# Patient Record
Sex: Male | Born: 1950 | Race: White | Hispanic: No | Smoking: Never smoker
Health system: Southern US, Community
[De-identification: ages and names within clinical notes are randomized; demographics above are authoritative.]

## PROBLEM LIST (undated history)

## (undated) DIAGNOSIS — R51 Headache: Secondary | ICD-10-CM

## (undated) DIAGNOSIS — G5600 Carpal tunnel syndrome, unspecified upper limb: Secondary | ICD-10-CM

## (undated) DIAGNOSIS — N289 Disorder of kidney and ureter, unspecified: Secondary | ICD-10-CM

## (undated) DIAGNOSIS — K219 Gastro-esophageal reflux disease without esophagitis: Secondary | ICD-10-CM

## (undated) DIAGNOSIS — M1990 Unspecified osteoarthritis, unspecified site: Secondary | ICD-10-CM

## (undated) DIAGNOSIS — N186 End stage renal disease: Secondary | ICD-10-CM

## (undated) DIAGNOSIS — I10 Essential (primary) hypertension: Secondary | ICD-10-CM

## (undated) DIAGNOSIS — T4145XA Adverse effect of unspecified anesthetic, initial encounter: Secondary | ICD-10-CM

## (undated) DIAGNOSIS — F40240 Claustrophobia: Secondary | ICD-10-CM

## (undated) DIAGNOSIS — E119 Type 2 diabetes mellitus without complications: Secondary | ICD-10-CM

## (undated) DIAGNOSIS — G459 Transient cerebral ischemic attack, unspecified: Secondary | ICD-10-CM

## (undated) DIAGNOSIS — R519 Headache, unspecified: Secondary | ICD-10-CM

## (undated) DIAGNOSIS — T8859XA Other complications of anesthesia, initial encounter: Secondary | ICD-10-CM

## (undated) DIAGNOSIS — D759 Disease of blood and blood-forming organs, unspecified: Secondary | ICD-10-CM

## (undated) DIAGNOSIS — E785 Hyperlipidemia, unspecified: Secondary | ICD-10-CM

## (undated) HISTORY — PX: HAND SURGERY: SHX662

## (undated) HISTORY — DX: Transient cerebral ischemic attack, unspecified: G45.9

---

## 1984-08-16 HISTORY — PX: HERNIA REPAIR: SHX51

## 2000-10-13 ENCOUNTER — Ambulatory Visit (HOSPITAL_BASED_OUTPATIENT_CLINIC_OR_DEPARTMENT_OTHER): Admission: RE | Admit: 2000-10-13 | Discharge: 2000-10-13 | Payer: Self-pay | Admitting: *Deleted

## 2005-04-10 ENCOUNTER — Emergency Department (HOSPITAL_COMMUNITY): Admission: EM | Admit: 2005-04-10 | Discharge: 2005-04-10 | Payer: Self-pay | Admitting: Emergency Medicine

## 2005-07-28 ENCOUNTER — Ambulatory Visit: Payer: Self-pay | Admitting: Family Medicine

## 2005-09-17 ENCOUNTER — Ambulatory Visit: Payer: Self-pay | Admitting: Family Medicine

## 2005-09-30 ENCOUNTER — Ambulatory Visit (HOSPITAL_COMMUNITY): Admission: RE | Admit: 2005-09-30 | Discharge: 2005-09-30 | Payer: Self-pay | Admitting: Nephrology

## 2005-10-18 ENCOUNTER — Ambulatory Visit: Payer: Self-pay | Admitting: Family Medicine

## 2005-10-19 ENCOUNTER — Ambulatory Visit (HOSPITAL_COMMUNITY): Admission: RE | Admit: 2005-10-19 | Discharge: 2005-10-19 | Payer: Self-pay | Admitting: Family Medicine

## 2005-12-14 ENCOUNTER — Ambulatory Visit: Payer: Self-pay | Admitting: Family Medicine

## 2005-12-16 ENCOUNTER — Ambulatory Visit: Payer: Self-pay | Admitting: Family Medicine

## 2005-12-16 LAB — CONVERTED CEMR LAB: Hgb A1c MFr Bld: 7 %

## 2005-12-20 ENCOUNTER — Encounter (INDEPENDENT_AMBULATORY_CARE_PROVIDER_SITE_OTHER): Payer: Self-pay | Admitting: Family Medicine

## 2005-12-20 LAB — CONVERTED CEMR LAB: TSH: 2.032 microintl units/mL

## 2005-12-30 ENCOUNTER — Ambulatory Visit: Payer: Self-pay | Admitting: Family Medicine

## 2006-03-08 ENCOUNTER — Encounter (INDEPENDENT_AMBULATORY_CARE_PROVIDER_SITE_OTHER): Payer: Self-pay | Admitting: Family Medicine

## 2006-03-31 ENCOUNTER — Ambulatory Visit: Payer: Self-pay | Admitting: Family Medicine

## 2006-06-30 ENCOUNTER — Ambulatory Visit: Payer: Self-pay | Admitting: Family Medicine

## 2006-06-30 LAB — CONVERTED CEMR LAB: Hgb A1c MFr Bld: 6.7 %

## 2006-07-14 DIAGNOSIS — G5600 Carpal tunnel syndrome, unspecified upper limb: Secondary | ICD-10-CM

## 2006-07-14 DIAGNOSIS — N183 Chronic kidney disease, stage 3 (moderate): Secondary | ICD-10-CM

## 2006-07-14 DIAGNOSIS — N200 Calculus of kidney: Secondary | ICD-10-CM

## 2006-07-14 DIAGNOSIS — N529 Male erectile dysfunction, unspecified: Secondary | ICD-10-CM

## 2006-07-14 DIAGNOSIS — R809 Proteinuria, unspecified: Secondary | ICD-10-CM | POA: Insufficient documentation

## 2006-07-14 DIAGNOSIS — I10 Essential (primary) hypertension: Secondary | ICD-10-CM

## 2006-07-14 DIAGNOSIS — E785 Hyperlipidemia, unspecified: Secondary | ICD-10-CM

## 2006-07-14 DIAGNOSIS — IMO0002 Reserved for concepts with insufficient information to code with codable children: Secondary | ICD-10-CM

## 2006-07-27 ENCOUNTER — Ambulatory Visit (HOSPITAL_COMMUNITY): Admission: RE | Admit: 2006-07-27 | Discharge: 2006-07-27 | Payer: Self-pay | Admitting: Family Medicine

## 2006-08-11 ENCOUNTER — Encounter (INDEPENDENT_AMBULATORY_CARE_PROVIDER_SITE_OTHER): Payer: Self-pay | Admitting: Family Medicine

## 2006-08-31 ENCOUNTER — Ambulatory Visit (HOSPITAL_COMMUNITY): Admission: RE | Admit: 2006-08-31 | Discharge: 2006-08-31 | Payer: Self-pay | Admitting: Family Medicine

## 2006-08-31 ENCOUNTER — Ambulatory Visit: Payer: Self-pay | Admitting: Family Medicine

## 2006-09-14 ENCOUNTER — Encounter (INDEPENDENT_AMBULATORY_CARE_PROVIDER_SITE_OTHER): Payer: Self-pay | Admitting: Family Medicine

## 2006-09-14 ENCOUNTER — Ambulatory Visit: Payer: Self-pay | Admitting: Orthopedic Surgery

## 2006-10-10 ENCOUNTER — Encounter (INDEPENDENT_AMBULATORY_CARE_PROVIDER_SITE_OTHER): Payer: Self-pay | Admitting: Family Medicine

## 2006-10-20 ENCOUNTER — Encounter (INDEPENDENT_AMBULATORY_CARE_PROVIDER_SITE_OTHER): Payer: Self-pay | Admitting: Family Medicine

## 2006-12-07 ENCOUNTER — Encounter (INDEPENDENT_AMBULATORY_CARE_PROVIDER_SITE_OTHER): Payer: Self-pay | Admitting: Family Medicine

## 2007-01-05 ENCOUNTER — Encounter (INDEPENDENT_AMBULATORY_CARE_PROVIDER_SITE_OTHER): Payer: Self-pay | Admitting: Family Medicine

## 2007-01-10 ENCOUNTER — Telehealth (INDEPENDENT_AMBULATORY_CARE_PROVIDER_SITE_OTHER): Payer: Self-pay | Admitting: *Deleted

## 2007-02-06 ENCOUNTER — Encounter (INDEPENDENT_AMBULATORY_CARE_PROVIDER_SITE_OTHER): Payer: Self-pay | Admitting: Family Medicine

## 2007-02-28 ENCOUNTER — Telehealth (INDEPENDENT_AMBULATORY_CARE_PROVIDER_SITE_OTHER): Payer: Self-pay | Admitting: Family Medicine

## 2007-03-20 ENCOUNTER — Ambulatory Visit: Payer: Self-pay | Admitting: Family Medicine

## 2007-03-20 LAB — CONVERTED CEMR LAB
Cholesterol, target level: 200 mg/dL
Glucose, Bld: 249 mg/dL
HDL goal, serum: 40 mg/dL
LDL Goal: 100 mg/dL

## 2007-03-28 ENCOUNTER — Encounter (INDEPENDENT_AMBULATORY_CARE_PROVIDER_SITE_OTHER): Payer: Self-pay | Admitting: Family Medicine

## 2007-03-30 ENCOUNTER — Telehealth (INDEPENDENT_AMBULATORY_CARE_PROVIDER_SITE_OTHER): Payer: Self-pay | Admitting: *Deleted

## 2007-03-30 LAB — CONVERTED CEMR LAB
AST: 14 units/L (ref 0–37)
BUN: 33 mg/dL — ABNORMAL HIGH (ref 6–23)
Basophils Absolute: 0 10*3/uL (ref 0.0–0.1)
CO2: 23 meq/L (ref 19–32)
Calcium: 9.4 mg/dL (ref 8.4–10.5)
Chloride: 105 meq/L (ref 96–112)
Cholesterol: 162 mg/dL (ref 0–200)
Creatinine, Urine: 68.1 mg/dL
Eosinophils Absolute: 0.3 10*3/uL (ref 0.0–0.7)
HDL: 48 mg/dL (ref >39)
Lymphs Abs: 1.6 10*3/uL (ref 0.7–3.3)
MCHC: 33 g/dL (ref 30.0–36.0)
Microalb Creat Ratio: 860.9 mg/g — ABNORMAL HIGH (ref 0.0–30.0)
Microalb, Ur: 58.6 mg/dL — ABNORMAL HIGH (ref 0.00–1.89)
Monocytes Absolute: 0.6 10*3/uL (ref 0.2–0.7)
Neutro Abs: 4.5 10*3/uL (ref 1.7–7.7)
RBC: 5.52 M/uL (ref 4.22–5.81)
TSH: 2.561 microintl units/mL (ref 0.350–5.50)
VLDL: 46 mg/dL — ABNORMAL HIGH (ref 0–40)

## 2007-04-04 ENCOUNTER — Telehealth (INDEPENDENT_AMBULATORY_CARE_PROVIDER_SITE_OTHER): Payer: Self-pay | Admitting: Family Medicine

## 2007-05-12 ENCOUNTER — Encounter (INDEPENDENT_AMBULATORY_CARE_PROVIDER_SITE_OTHER): Payer: Self-pay | Admitting: Family Medicine

## 2007-06-27 ENCOUNTER — Ambulatory Visit: Payer: Self-pay | Admitting: Family Medicine

## 2007-06-27 LAB — CONVERTED CEMR LAB
Glucose, Bld: 201 mg/dL
Hgb A1c MFr Bld: 8.5 %

## 2007-06-28 ENCOUNTER — Ambulatory Visit: Payer: Self-pay | Admitting: Family Medicine

## 2007-07-27 ENCOUNTER — Ambulatory Visit: Payer: Self-pay | Admitting: Family Medicine

## 2007-07-28 ENCOUNTER — Encounter (INDEPENDENT_AMBULATORY_CARE_PROVIDER_SITE_OTHER): Payer: Self-pay | Admitting: Family Medicine

## 2007-07-28 ENCOUNTER — Telehealth (INDEPENDENT_AMBULATORY_CARE_PROVIDER_SITE_OTHER): Payer: Self-pay | Admitting: *Deleted

## 2007-07-28 LAB — CONVERTED CEMR LAB
BUN: 29 mg/dL — ABNORMAL HIGH (ref 6–23)
Potassium: 4.2 meq/L (ref 3.5–5.3)

## 2007-07-31 ENCOUNTER — Encounter (INDEPENDENT_AMBULATORY_CARE_PROVIDER_SITE_OTHER): Payer: Self-pay | Admitting: Family Medicine

## 2007-08-17 ENCOUNTER — Encounter: Payer: Self-pay | Admitting: Family Medicine

## 2007-09-27 ENCOUNTER — Ambulatory Visit: Payer: Self-pay | Admitting: Family Medicine

## 2007-10-21 ENCOUNTER — Encounter (INDEPENDENT_AMBULATORY_CARE_PROVIDER_SITE_OTHER): Payer: Self-pay | Admitting: Family Medicine

## 2007-10-21 LAB — CONVERTED CEMR LAB
BUN: 14 mg/dL
Cholesterol: 144 mg/dL
Creatinine, Ser: 2.16 mg/dL
PSA: 2 ng/mL
Triglyceride fasting, serum: 183 mg/dL

## 2007-11-08 ENCOUNTER — Ambulatory Visit: Payer: Self-pay | Admitting: Family Medicine

## 2007-12-25 ENCOUNTER — Ambulatory Visit: Payer: Self-pay | Admitting: Family Medicine

## 2007-12-25 DIAGNOSIS — E1121 Type 2 diabetes mellitus with diabetic nephropathy: Secondary | ICD-10-CM

## 2007-12-25 LAB — CONVERTED CEMR LAB
Glucose, Bld: 196 mg/dL
Hgb A1c MFr Bld: 7 %

## 2008-04-16 ENCOUNTER — Encounter (INDEPENDENT_AMBULATORY_CARE_PROVIDER_SITE_OTHER): Payer: Self-pay | Admitting: Family Medicine

## 2008-05-06 ENCOUNTER — Ambulatory Visit: Payer: Self-pay | Admitting: Family Medicine

## 2008-05-08 ENCOUNTER — Encounter (INDEPENDENT_AMBULATORY_CARE_PROVIDER_SITE_OTHER): Payer: Self-pay | Admitting: Family Medicine

## 2008-07-05 ENCOUNTER — Telehealth (INDEPENDENT_AMBULATORY_CARE_PROVIDER_SITE_OTHER): Payer: Self-pay | Admitting: *Deleted

## 2008-08-05 ENCOUNTER — Ambulatory Visit: Payer: Self-pay | Admitting: Family Medicine

## 2008-08-06 ENCOUNTER — Encounter (INDEPENDENT_AMBULATORY_CARE_PROVIDER_SITE_OTHER): Payer: Self-pay | Admitting: Family Medicine

## 2008-08-06 LAB — CONVERTED CEMR LAB
ALT: 12 units/L (ref 0–53)
AST: 11 units/L (ref 0–37)
Albumin: 3.7 g/dL (ref 3.5–5.2)
BUN: 37 mg/dL — ABNORMAL HIGH (ref 6–23)
Chloride: 106 meq/L (ref 96–112)
Glucose, Bld: 164 mg/dL — ABNORMAL HIGH (ref 70–99)
Sodium: 137 meq/L (ref 135–145)
Total Bilirubin: 0.4 mg/dL (ref 0.3–1.2)

## 2008-11-04 ENCOUNTER — Ambulatory Visit: Payer: Self-pay | Admitting: Family Medicine

## 2008-11-04 LAB — CONVERTED CEMR LAB: Glucose, Bld: 182 mg/dL

## 2008-11-08 ENCOUNTER — Encounter (INDEPENDENT_AMBULATORY_CARE_PROVIDER_SITE_OTHER): Payer: Self-pay | Admitting: Family Medicine

## 2008-11-08 LAB — CONVERTED CEMR LAB
Hgb A1c MFr Bld: 6.2 %
TSH: 3.4 microintl units/mL

## 2008-11-09 LAB — CONVERTED CEMR LAB: PSA: 1.9 ng/mL

## 2008-12-04 ENCOUNTER — Ambulatory Visit: Payer: Self-pay | Admitting: Family Medicine

## 2008-12-04 LAB — CONVERTED CEMR LAB: Microalb, Ur: 120.9 mg/dL

## 2008-12-10 LAB — CONVERTED CEMR LAB
Cholesterol: 238 mg/dL
HDL: 42 mg/dL
LDL Cholesterol: 144 mg/dL
Total CHOL/HDL Ratio: 5.7
Triglycerides: 259 mg/dL

## 2009-03-10 ENCOUNTER — Ambulatory Visit: Payer: Self-pay | Admitting: Family Medicine

## 2009-06-20 ENCOUNTER — Ambulatory Visit (HOSPITAL_COMMUNITY): Admission: RE | Admit: 2009-06-20 | Discharge: 2009-06-20 | Payer: Self-pay | Admitting: Family Medicine

## 2010-09-15 NOTE — Letter (Signed)
Summary: rpc chart  rpc chart   Imported By: Curtis Sites 05/28/2010 15:20:11  _____________________________________________________________________  External Attachment:    Type:   Image     Comment:   External Document

## 2012-07-20 ENCOUNTER — Other Ambulatory Visit (HOSPITAL_COMMUNITY): Payer: Self-pay | Admitting: *Deleted

## 2012-07-21 ENCOUNTER — Ambulatory Visit (HOSPITAL_COMMUNITY)
Admission: RE | Admit: 2012-07-21 | Discharge: 2012-07-21 | Disposition: A | Discharge status: Home / Self Care | Payer: BC Managed Care – PPO | Source: Ambulatory Visit | Attending: Nephrology | Admitting: Nephrology

## 2012-07-21 DIAGNOSIS — D582 Other hemoglobinopathies: Secondary | ICD-10-CM | POA: Insufficient documentation

## 2012-07-21 MED ORDER — LIDOCAINE HCL (PF) 1 % IJ SOLN
INTRAMUSCULAR | Status: AC
  Administered 2012-07-21: 2 mL via INTRADERMAL
  Filled 2012-07-21: qty 2

## 2012-07-21 MED ORDER — LIDOCAINE HCL (PF) 2 % IJ SOLN
2.0000 mL | INTRAMUSCULAR | Status: DC | PRN
  Filled 2012-07-21: qty 2

## 2012-07-21 NOTE — Progress Notes (Signed)
Used right AC and removed 500cc of blood.  Pt tolerated procedure well

## 2012-10-12 ENCOUNTER — Other Ambulatory Visit (HOSPITAL_COMMUNITY): Payer: Self-pay | Admitting: *Deleted

## 2012-10-13 ENCOUNTER — Encounter (HOSPITAL_COMMUNITY)
Admission: RE | Admit: 2012-10-13 | Discharge: 2012-10-13 | Disposition: A | Discharge status: Home / Self Care | Payer: BC Managed Care – PPO | Source: Ambulatory Visit | Attending: Nephrology | Admitting: Nephrology

## 2012-10-13 DIAGNOSIS — D582 Other hemoglobinopathies: Secondary | ICD-10-CM | POA: Insufficient documentation

## 2012-10-13 MED ORDER — LIDOCAINE HCL (PF) 1 % IJ SOLN
INTRAMUSCULAR | Status: AC
  Administered 2012-10-13: 0.1 mL via TRANSDERMAL
  Filled 2012-10-13: qty 2

## 2012-10-13 MED ORDER — LIDOCAINE HCL (PF) 1 % IJ SOLN
2.0000 mL | Freq: Once | INTRAMUSCULAR | Status: DC
Start: 2012-10-13 — End: 2012-10-14

## 2012-10-13 NOTE — Progress Notes (Signed)
Used lidocaine in left AC to numb site.  Phlebotomized 500cc of blood waste.  Patient tolerated well.

## 2013-01-19 ENCOUNTER — Other Ambulatory Visit (HOSPITAL_COMMUNITY): Payer: Self-pay | Admitting: *Deleted

## 2013-01-22 ENCOUNTER — Ambulatory Visit (HOSPITAL_COMMUNITY)
Admission: RE | Admit: 2013-01-22 | Discharge: 2013-01-22 | Disposition: A | Discharge status: Home / Self Care | Payer: BC Managed Care – PPO | Source: Ambulatory Visit | Attending: Nephrology | Admitting: Nephrology

## 2013-01-22 DIAGNOSIS — D582 Other hemoglobinopathies: Secondary | ICD-10-CM | POA: Insufficient documentation

## 2013-01-22 MED ORDER — LIDOCAINE HCL (PF) 2 % IJ SOLN
2.0000 mL | Freq: Once | INTRAMUSCULAR | Status: AC
  Administered 2013-01-22: 0.2 mL via INTRADERMAL
  Filled 2013-01-22: qty 2

## 2013-01-25 ENCOUNTER — Other Ambulatory Visit: Payer: Self-pay | Admitting: Podiatry

## 2013-01-25 ENCOUNTER — Ambulatory Visit (HOSPITAL_COMMUNITY)
Admission: RE | Admit: 2013-01-25 | Discharge: 2013-01-25 | Disposition: A | Discharge status: Home / Self Care | Payer: BC Managed Care – PPO | Source: Ambulatory Visit | Attending: Cardiovascular Disease | Admitting: Cardiovascular Disease

## 2013-01-25 DIAGNOSIS — M79609 Pain in unspecified limb: Secondary | ICD-10-CM

## 2013-01-25 DIAGNOSIS — R609 Edema, unspecified: Secondary | ICD-10-CM

## 2013-01-25 DIAGNOSIS — M7989 Other specified soft tissue disorders: Secondary | ICD-10-CM

## 2013-01-25 NOTE — Progress Notes (Signed)
Lower Extremity Venous Duplex Completed. Negative. °Brianna L Mazza ° °

## 2013-02-11 ENCOUNTER — Encounter (HOSPITAL_COMMUNITY): Payer: Self-pay | Admitting: *Deleted

## 2013-02-11 ENCOUNTER — Emergency Department (HOSPITAL_COMMUNITY): Payer: BC Managed Care – PPO

## 2013-02-11 ENCOUNTER — Emergency Department (HOSPITAL_COMMUNITY)
Admission: EM | Admit: 2013-02-11 | Discharge: 2013-02-11 | Disposition: A | Discharge status: Home / Self Care | Payer: BC Managed Care – PPO | Attending: Emergency Medicine | Admitting: Emergency Medicine

## 2013-02-11 DIAGNOSIS — Z87448 Personal history of other diseases of urinary system: Secondary | ICD-10-CM | POA: Insufficient documentation

## 2013-02-11 DIAGNOSIS — Z880 Allergy status to penicillin: Secondary | ICD-10-CM | POA: Insufficient documentation

## 2013-02-11 DIAGNOSIS — Z862 Personal history of diseases of the blood and blood-forming organs and certain disorders involving the immune mechanism: Secondary | ICD-10-CM | POA: Insufficient documentation

## 2013-02-11 DIAGNOSIS — M47816 Spondylosis without myelopathy or radiculopathy, lumbar region: Secondary | ICD-10-CM

## 2013-02-11 DIAGNOSIS — M479 Spondylosis, unspecified: Secondary | ICD-10-CM | POA: Insufficient documentation

## 2013-02-11 DIAGNOSIS — M259 Joint disorder, unspecified: Secondary | ICD-10-CM | POA: Insufficient documentation

## 2013-02-11 DIAGNOSIS — E119 Type 2 diabetes mellitus without complications: Secondary | ICD-10-CM | POA: Insufficient documentation

## 2013-02-11 DIAGNOSIS — M169 Osteoarthritis of hip, unspecified: Secondary | ICD-10-CM

## 2013-02-11 HISTORY — DX: Disease of blood and blood-forming organs, unspecified: D75.9

## 2013-02-11 HISTORY — DX: Type 2 diabetes mellitus without complications: E11.9

## 2013-02-11 HISTORY — DX: Disorder of kidney and ureter, unspecified: N28.9

## 2013-02-11 MED ORDER — ACETAMINOPHEN 500 MG PO TABS
1000.0000 mg | ORAL_TABLET | Freq: Once | ORAL | Status: AC
  Administered 2013-02-11: 1000 mg via ORAL
  Filled 2013-02-11: qty 2

## 2013-02-11 MED ORDER — HYDROCODONE-ACETAMINOPHEN 5-325 MG PO TABS: 1.0000 | ORAL_TABLET | ORAL | Status: DC | PRN

## 2013-02-11 NOTE — ED Notes (Signed)
Pt c/o right hip pain that will radiate around to right groin area and other times will radiate around from right groin to right hip area. Denies any urinary problems, n/v/d, denies any injury, states that if he gets in a comfortable position the pain will ease

## 2013-02-12 MED FILL — Hydrocodone-Acetaminophen Tab 5-325 MG: ORAL | Qty: 6 | Status: AC

## 2013-02-15 NOTE — ED Provider Notes (Signed)
History    CSN: [id number] Arrival date & time 02/11/13  1605  First MD Initiated Contact with Patient 02/11/13 1625     Chief Complaint  Patient presents with  . Back Pain   (Consider location/radiation/quality/duration/timing/severity/associated sxs/prior Treatment) HPI Comments: Kenneth [name] is a 63 y.o. Male presenting with chronic intermittent low right posterior back pain that radiates to his right hip and groin area which has been worsened the past few weeks.  Pain is aching and worse with certain positions and with movement and sometimes weight bearing.  He states he cannot sleep on his right side anymore due to increased pain with this position.  He denies injury, has had no fevers, falls, urinary symptoms including dysuria,  Hematuria, and has had no abdominal pain,rash, nausea or emesis.  He has taken tylenol without relief.       The history is provided by the patient.   Past Medical History  Diagnosis Date  . Diabetes mellitus without complication   . Renal disorder   . Blood disease     states that he makes too many red blood cells.   Past Surgical History  Procedure Laterality Date  . Hernia repair     No family history on file. History  Substance Use Topics  . Smoking status: Not on file  . Smokeless tobacco: Not on file  . Alcohol Use: Not on file    Review of Systems  Constitutional: Negative for fever.  Respiratory: Negative for shortness of breath.   Cardiovascular: Negative for chest pain and leg swelling.  Gastrointestinal: Negative for abdominal pain, constipation and abdominal distention.  Genitourinary: Negative for dysuria, urgency, frequency, flank pain and difficulty urinating.  Musculoskeletal: Positive for back pain and arthralgias. Negative for joint swelling and gait problem.  Skin: Negative for rash.  Neurological: Negative for weakness and numbness.    Allergies  Penicillins  Home Medications   Current Outpatient Rx  Name   Route  Sig  Dispense  Refill  . HYDROcodone-acetaminophen (NORCO/VICODIN) 5-325 MG per tablet   Oral   Take 1 tablet by mouth every 4 (four) hours as needed for pain.   6 tablet   0   . HYDROcodone-acetaminophen (NORCO/VICODIN) 5-325 MG per tablet   Oral   Take 1 tablet by mouth every 4 (four) hours as needed for pain.   20 tablet   0    BP 156/71  Pulse 76  Temp(Src) 97.5 F (36.4 C) (Oral)  Resp 20  SpO2 100% Physical Exam  Nursing note and vitals reviewed. Constitutional: He appears well-developed and well-nourished.  HENT:  Head: Normocephalic.  Eyes: Conjunctivae are normal.  Neck: Normal range of motion. Neck supple.  Cardiovascular: Normal rate and intact distal pulses.   Pedal pulses normal.  Pulmonary/Chest: Effort normal.  Abdominal: Soft. Bowel sounds are normal. He exhibits no distension and no mass.  Musculoskeletal: Normal range of motion. He exhibits no edema.       Lumbar back: He exhibits tenderness. He exhibits no swelling, no edema and no spasm.  ttp along right posterior lateral pelvis.  Pain is reproduced with internal and external rotation of right hip.  Neurological: He is alert. He has normal strength. He displays no atrophy and no tremor. No sensory deficit. Gait normal.  Reflex Scores:      Patellar reflexes are 2+ on the right side and 2+ on the left side.      Achilles reflexes are 2+ on the right side  and 2+ on the left side. No strength deficit noted in hip and knee flexor and extensor muscle groups.  Ankle flexion and extension intact.  Skin: Skin is warm and dry.  Psychiatric: He has a normal mood and affect.    ED Course  Procedures (including critical care time) Labs Reviewed  GLUCOSE, CAPILLARY - Abnormal; Notable for the following:    Glucose-Capillary 197 (*)    All other components within normal limits   No results found. 1. DJD (degenerative joint disease) of lumbar spine   2. DJD (degenerative joint disease) of hip     MDM   Patients labs and/or radiological studies were viewed and considered during the medical decision making and disposition process.  The patient appears reasonably screened and/or stabilized for discharge and I doubt any other medical condition or other [facility] requiring further screening, evaluation, or treatment in the ED at this time prior to discharge.  Pt prescribed hydrocdone,  Encouraged heating pad qid. F/u with pcp and/or Dr. Hilda Lias whom he was given a referral.    Burgess Amor, PA-C 02/15/13 [phone]

## 2013-02-16 NOTE — ED Provider Notes (Signed)
Medical screening examination/treatment/procedure(s) were performed by non-physician practitioner and as supervising physician I was immediately available for consultation/collaboration.   Joya Gaskins, MD 02/16/13 [phone]

## 2013-04-26 ENCOUNTER — Other Ambulatory Visit (HOSPITAL_COMMUNITY): Payer: Self-pay | Admitting: *Deleted

## 2013-04-27 ENCOUNTER — Ambulatory Visit (HOSPITAL_COMMUNITY)
Admission: RE | Admit: 2013-04-27 | Discharge: 2013-04-27 | Disposition: A | Discharge status: Home / Self Care | Payer: BC Managed Care – PPO | Source: Ambulatory Visit | Attending: Nephrology | Admitting: Nephrology

## 2013-04-27 DIAGNOSIS — D582 Other hemoglobinopathies: Secondary | ICD-10-CM | POA: Insufficient documentation

## 2013-04-27 MED ORDER — LIDOCAINE HCL (PF) 2 % IJ SOLN
2.0000 mL | Freq: Once | INTRAMUSCULAR | Status: AC | PRN
  Administered 2013-04-27: 2 mL via INTRADERMAL
  Filled 2013-04-27: qty 2

## 2013-04-27 NOTE — Progress Notes (Signed)
500 cc phlebotomy complete pt tolerated well

## 2013-07-25 ENCOUNTER — Other Ambulatory Visit: Payer: Self-pay | Admitting: *Deleted

## 2013-07-25 DIAGNOSIS — N184 Chronic kidney disease, stage 4 (severe): Secondary | ICD-10-CM

## 2013-07-25 DIAGNOSIS — Z01810 Encounter for preprocedural cardiovascular examination: Secondary | ICD-10-CM

## 2013-07-26 ENCOUNTER — Other Ambulatory Visit (HOSPITAL_COMMUNITY): Payer: Self-pay | Admitting: *Deleted

## 2013-07-27 ENCOUNTER — Encounter (HOSPITAL_COMMUNITY)
Admission: RE | Admit: 2013-07-27 | Discharge: 2013-07-27 | Disposition: A | Discharge status: Home / Self Care | Payer: BC Managed Care – PPO | Source: Ambulatory Visit | Attending: Nephrology | Admitting: Nephrology

## 2013-07-27 DIAGNOSIS — D45 Polycythemia vera: Secondary | ICD-10-CM | POA: Insufficient documentation

## 2013-07-27 MED ORDER — LIDOCAINE HCL (PF) 1 % IJ SOLN
INTRAMUSCULAR | Status: AC
  Administered 2013-07-27: 2 mL via INTRADERMAL
  Filled 2013-07-27: qty 2

## 2013-07-27 MED ORDER — LIDOCAINE HCL (PF) 1 % IJ SOLN: 2.0000 mL | Freq: Once | INTRAMUSCULAR | Status: DC

## 2013-07-27 MED ORDER — LIDOCAINE HCL (PF) 2 % IJ SOLN
2.0000 mL | Freq: Once | INTRAMUSCULAR | Status: DC
  Filled 2013-07-27: qty 2

## 2013-07-27 NOTE — Progress Notes (Signed)
Phlebotomized 500 ml from left antecubital without difficulty.. Tolerated well.

## 2013-08-27 ENCOUNTER — Encounter: Payer: Self-pay | Admitting: Vascular Surgery

## 2013-08-28 ENCOUNTER — Ambulatory Visit (INDEPENDENT_AMBULATORY_CARE_PROVIDER_SITE_OTHER): Payer: BC Managed Care – PPO | Admitting: Vascular Surgery

## 2013-08-28 ENCOUNTER — Ambulatory Visit (INDEPENDENT_AMBULATORY_CARE_PROVIDER_SITE_OTHER)
Admission: RE | Admit: 2013-08-28 | Discharge: 2013-08-28 | Disposition: A | Discharge status: Home / Self Care | Payer: BC Managed Care – PPO | Source: Ambulatory Visit | Attending: Vascular Surgery | Admitting: Vascular Surgery

## 2013-08-28 ENCOUNTER — Ambulatory Visit (HOSPITAL_COMMUNITY)
Admission: RE | Admit: 2013-08-28 | Discharge: 2013-08-28 | Disposition: A | Discharge status: Home / Self Care | Payer: BC Managed Care – PPO | Source: Ambulatory Visit | Attending: Vascular Surgery | Admitting: Vascular Surgery

## 2013-08-28 ENCOUNTER — Encounter: Payer: Self-pay | Admitting: Vascular Surgery

## 2013-08-28 ENCOUNTER — Other Ambulatory Visit: Payer: Self-pay | Admitting: *Deleted

## 2013-08-28 VITALS — BP 160/65 | HR 60 | Resp 18 | Ht 67.0 in | Wt 253.9 lb

## 2013-08-28 DIAGNOSIS — N184 Chronic kidney disease, stage 4 (severe): Secondary | ICD-10-CM

## 2013-08-28 DIAGNOSIS — Z01810 Encounter for preprocedural cardiovascular examination: Secondary | ICD-10-CM

## 2013-08-28 NOTE — Progress Notes (Signed)
   Patient name: Kenneth [name] MRN: [id number] DOB: 02/12/1951 Sex: male   Referred by: Goldsborough  Reason for referral:  Chief Complaint  Patient presents with  . Follow-up    HD access placement referred by Dr. Goldsborough    HISTORY OF PRESENT ILLNESS: Patient has today for discussion regarding access for hemodialysis. He has a long history of moderate her renal insufficiency and recently has had a significant progression stage IV. Most recent creatinine is 3.6. He has never had any hemodialysis in the past. He is here today with his family.   Past Medical History  Diagnosis Date  . Diabetes mellitus without complication   . Renal disorder   . Blood disease     states that he makes too many red blood cells.    Past Surgical History  Procedure Laterality Date  . Hernia repair      History   Social History  . Marital Status: Divorced    Spouse Name: N/A    Number of Children: N/A  . Years of Education: N/A   Occupational History  . Not on file.   Social History Main Topics  . Smoking status: Never Smoker   . Smokeless tobacco: Not on file  . Alcohol Use: No  . Drug Use: No  . Sexual Activity: Not on file   Other Topics Concern  . Not on file   Social History Narrative  . No narrative on file    Family History  Problem Relation Age of Onset  . Cancer Father   . Diabetes Father   . Heart disease Father   . Hypertension Daughter   . Hyperlipidemia Son     Allergies as of 08/28/2013 - Review Complete 08/28/2013  Allergen Reaction Noted  . Penicillins  12/25/2007    Current Outpatient Prescriptions on File Prior to Visit  Medication Sig Dispense Refill  . insulin lispro protamine-lispro (HUMALOG 75/25) (75-25) 100 UNIT/ML SUSP injection Inject 60 Units into the skin daily with breakfast.      . insulin lispro protamine-lispro (HUMALOG 75/25) (75-25) 100 UNIT/ML SUSP injection Inject 15 Units into the skin daily before lunch.      . insulin  lispro protamine-lispro (HUMALOG 75/25) (75-25) 100 UNIT/ML SUSP injection Inject 50 Units into the skin daily with supper.      . HYDROcodone-acetaminophen (NORCO/VICODIN) 5-325 MG per tablet Take 1 tablet by mouth every 4 (four) hours as needed for pain.  6 tablet  0  . HYDROcodone-acetaminophen (NORCO/VICODIN) 5-325 MG per tablet Take 1 tablet by mouth every 4 (four) hours as needed for pain.  20 tablet  0   No current facility-administered medications on file prior to visit.     REVIEW OF SYSTEMS:  Positives indicated with an "X"  CARDIOVASCULAR:  [ ] chest pain   [ ] chest pressure   [ ] palpitations   [ ] orthopnea   [ ] dyspnea on exertion   [ ] claudication   [ ] rest pain   [ ] DVT   [ ] phlebitis PULMONARY:   [ ] productive cough   [ ] asthma   [ ] wheezing NEUROLOGIC:   [ ] weakness  [ ] paresthesias  [ ] aphasia  [ ] amaurosis  [ ] dizziness HEMATOLOGIC:   [ ] bleeding problems   [ ] clotting disorders MUSCULOSKELETAL:  [ ] joint pain   [ ] joint swelling GASTROINTESTINAL: [ ]  blood in stool  [ ]    hematemesis GENITOURINARY:  [ ]   dysuria  [ ]   hematuria PSYCHIATRIC:  [ ]  history of major depression INTEGUMENTARY:  [ ]  rashes  [ ]  ulcers CONSTITUTIONAL:  [ ]  fever   [ ]  chills  PHYSICAL EXAMINATION:  General: The patient is a well-nourished male, in no acute distress. Vital signs are BP 160/65  Pulse 60  Resp 18  Ht 5\' 7"  (1.702 m)  Wt 253 lb 14.4 oz (115.168 kg)  BMI 39.76 kg/m2 Pulmonary: There is a goitch od air exchange bilaterally . Extremities There are no major deformities.  There is no significant extremity pain. Neurologic: No focal weakness or paresthesias are detected, Skin: There are no ulcer or rashes noted. Psychiatric: The patient has normal affect. Cardiovascular: There is a regular rate and rhythm without significant murmur appreciated. He does have 2+ radial pulses bilaterally. He does have moderately well-developed cephalic veins on the surface  bilaterally.  VVS Vascular Lab Studies:  Ordered and Independently Reviewed this revealed normal arterial flow to both hands. His cephalic vein map revealed a good caliber throughout its course  Impression and Plan:  Rest of chronic renal insufficiency and need for hemodialysis access. After long discussion with the patient's family explaining the access options of hemodialysis catheter, AV fistula an AV graft. He does have adequate cephalic vein for attempted AV fistula. I recommend that we proceed with this as soon as possible perineum for access for hemodialysis. He will notify us when he can arrange transportation with his family or outpatient left arm AV fistula    Nance Mccombs [facility] Office: [phone]

## 2013-09-06 ENCOUNTER — Encounter (HOSPITAL_COMMUNITY): Payer: Self-pay | Admitting: Pharmacy Technician

## 2013-09-13 ENCOUNTER — Encounter (HOSPITAL_COMMUNITY): Payer: Self-pay | Admitting: *Deleted

## 2013-09-13 MED ORDER — VANCOMYCIN HCL 10 G IV SOLR
1500.0000 mg | INTRAVENOUS | Status: AC
  Administered 2013-09-14: 1500 mg via INTRAVENOUS
  Filled 2013-09-13: qty 1500

## 2013-09-13 NOTE — Progress Notes (Signed)
09/13/13 [place]  Have you ever been diagnosed with sleep apnea through a sleep study? No  Do you snore loudly (loud enough to be heard through closed doors)?  0  Do you often feel tired, fatigued, or sleepy during the daytime? 0  Has anyone observed you stop breathing during your sleep? 0  Do you have, or are you being treated for high blood pressure? 0  BMI more than 35 kg/m2? 1  Age over 63 years old? 1  Neck circumference greater than 40 cm/18 inches? 1  Gender: 1  Obstructive Sleep Apnea Score 4  Score 4 or greater  Results sent to PCP

## 2013-09-14 ENCOUNTER — Ambulatory Visit (HOSPITAL_COMMUNITY): Payer: BC Managed Care – PPO

## 2013-09-14 ENCOUNTER — Other Ambulatory Visit: Payer: Self-pay | Admitting: *Deleted

## 2013-09-14 ENCOUNTER — Encounter (HOSPITAL_COMMUNITY): Payer: Self-pay | Admitting: *Deleted

## 2013-09-14 ENCOUNTER — Ambulatory Visit (HOSPITAL_COMMUNITY)
Admission: RE | Admit: 2013-09-14 | Discharge: 2013-09-14 | Disposition: A | Discharge status: Home / Self Care | Payer: BC Managed Care – PPO | Source: Ambulatory Visit | Attending: Vascular Surgery | Admitting: Vascular Surgery

## 2013-09-14 ENCOUNTER — Encounter (HOSPITAL_COMMUNITY)
Admission: RE | Disposition: A | Discharge status: Home / Self Care | Payer: Self-pay | Source: Ambulatory Visit | Attending: Vascular Surgery

## 2013-09-14 ENCOUNTER — Ambulatory Visit (HOSPITAL_COMMUNITY): Payer: BC Managed Care – PPO | Admitting: Anesthesiology

## 2013-09-14 ENCOUNTER — Encounter (HOSPITAL_COMMUNITY): Payer: BC Managed Care – PPO | Admitting: Anesthesiology

## 2013-09-14 DIAGNOSIS — N186 End stage renal disease: Secondary | ICD-10-CM

## 2013-09-14 DIAGNOSIS — N184 Chronic kidney disease, stage 4 (severe): Secondary | ICD-10-CM

## 2013-09-14 DIAGNOSIS — Z4931 Encounter for adequacy testing for hemodialysis: Secondary | ICD-10-CM

## 2013-09-14 DIAGNOSIS — K219 Gastro-esophageal reflux disease without esophagitis: Secondary | ICD-10-CM | POA: Insufficient documentation

## 2013-09-14 DIAGNOSIS — Z794 Long term (current) use of insulin: Secondary | ICD-10-CM | POA: Insufficient documentation

## 2013-09-14 DIAGNOSIS — I129 Hypertensive chronic kidney disease with stage 1 through stage 4 chronic kidney disease, or unspecified chronic kidney disease: Secondary | ICD-10-CM | POA: Insufficient documentation

## 2013-09-14 DIAGNOSIS — E119 Type 2 diabetes mellitus without complications: Secondary | ICD-10-CM | POA: Insufficient documentation

## 2013-09-14 HISTORY — DX: Hyperlipidemia, unspecified: E78.5

## 2013-09-14 HISTORY — DX: Unspecified osteoarthritis, unspecified site: M19.90

## 2013-09-14 HISTORY — DX: Carpal tunnel syndrome, unspecified upper limb: G56.00

## 2013-09-14 HISTORY — DX: Essential (primary) hypertension: I10

## 2013-09-14 HISTORY — PX: AV FISTULA PLACEMENT: SHX1204

## 2013-09-14 HISTORY — DX: Gastro-esophageal reflux disease without esophagitis: K21.9

## 2013-09-14 LAB — POCT I-STAT 4, (NA,K, GLUC, HGB,HCT)
Glucose, Bld: 272 mg/dL — ABNORMAL HIGH (ref 70–99)
HCT: 56 % — ABNORMAL HIGH (ref 39.0–52.0)
Hemoglobin: 19 g/dL — ABNORMAL HIGH (ref 13.0–17.0)
Potassium: 3.5 mEq/L — ABNORMAL LOW (ref 3.7–5.3)
Sodium: 138 mEq/L (ref 137–147)

## 2013-09-14 LAB — GLUCOSE, CAPILLARY: GLUCOSE-CAPILLARY: 230 mg/dL — AB (ref 70–99)

## 2013-09-14 SURGERY — ARTERIOVENOUS (AV) FISTULA CREATION
Anesthesia: Monitor Anesthesia Care | Laterality: Left

## 2013-09-14 MED ORDER — MIDAZOLAM HCL 5 MG/5ML IJ SOLN
INTRAMUSCULAR | Status: DC | PRN
  Administered 2013-09-14 (×2): 1 mg via INTRAVENOUS

## 2013-09-14 MED ORDER — PROPOFOL 10 MG/ML IV BOLUS
INTRAVENOUS | Status: AC
  Filled 2013-09-14: qty 20

## 2013-09-14 MED ORDER — HEPARIN SODIUM (PORCINE) 5000 UNIT/ML IJ SOLN
INTRAMUSCULAR | Status: DC | PRN
  Administered 2013-09-14: 07:00:00

## 2013-09-14 MED ORDER — PROMETHAZINE HCL 25 MG/ML IJ SOLN: 6.2500 mg | INTRAMUSCULAR | Status: DC | PRN

## 2013-09-14 MED ORDER — LIDOCAINE-EPINEPHRINE 0.5 %-1:200000 IJ SOLN
INTRAMUSCULAR | Status: DC | PRN
  Administered 2013-09-14: 50 mL

## 2013-09-14 MED ORDER — LIDOCAINE HCL (CARDIAC) 20 MG/ML IV SOLN
INTRAVENOUS | Status: DC | PRN
  Administered 2013-09-14 (×2): 50 mg via INTRAVENOUS

## 2013-09-14 MED ORDER — PROPOFOL INFUSION 10 MG/ML OPTIME
INTRAVENOUS | Status: DC | PRN
  Administered 2013-09-14: 50 ug/kg/min via INTRAVENOUS

## 2013-09-14 MED ORDER — LIDOCAINE-EPINEPHRINE 0.5 %-1:200000 IJ SOLN
INTRAMUSCULAR | Status: AC
  Filled 2013-09-14: qty 1

## 2013-09-14 MED ORDER — OXYCODONE HCL 5 MG/5ML PO SOLN: 5.0000 mg | Freq: Once | ORAL | Status: DC | PRN

## 2013-09-14 MED ORDER — MIDAZOLAM HCL 2 MG/2ML IJ SOLN
INTRAMUSCULAR | Status: AC
  Filled 2013-09-14: qty 2

## 2013-09-14 MED ORDER — FENTANYL CITRATE 0.05 MG/ML IJ SOLN
INTRAMUSCULAR | Status: DC | PRN
  Administered 2013-09-14 (×2): 25 ug via INTRAVENOUS
  Administered 2013-09-14: 50 ug via INTRAVENOUS

## 2013-09-14 MED ORDER — HYDROMORPHONE HCL PF 1 MG/ML IJ SOLN: 0.2500 mg | INTRAMUSCULAR | Status: DC | PRN

## 2013-09-14 MED ORDER — SODIUM CHLORIDE 0.9 % IV SOLN
INTRAVENOUS | Status: DC
Start: 2013-09-14 — End: 2013-09-14
  Administered 2013-09-14: 07:00:00 via INTRAVENOUS

## 2013-09-14 MED ORDER — FENTANYL CITRATE 0.05 MG/ML IJ SOLN
INTRAMUSCULAR | Status: AC
  Filled 2013-09-14: qty 5

## 2013-09-14 MED ORDER — ONDANSETRON HCL 4 MG/2ML IJ SOLN
INTRAMUSCULAR | Status: AC
  Filled 2013-09-14: qty 2

## 2013-09-14 MED ORDER — 0.9 % SODIUM CHLORIDE (POUR BTL) OPTIME
TOPICAL | Status: DC | PRN
  Administered 2013-09-14: 1000 mL

## 2013-09-14 MED ORDER — SODIUM CHLORIDE 0.9 % IV SOLN
INTRAVENOUS | Status: DC | PRN
  Administered 2013-09-14: 07:00:00 via INTRAVENOUS

## 2013-09-14 MED ORDER — LIDOCAINE HCL (CARDIAC) 20 MG/ML IV SOLN
INTRAVENOUS | Status: AC
  Filled 2013-09-14: qty 5

## 2013-09-14 MED ORDER — OXYCODONE-ACETAMINOPHEN 5-325 MG PO TABS: 1.0000 | ORAL_TABLET | Freq: Four times a day (QID) | ORAL | Status: DC | PRN

## 2013-09-14 MED ORDER — OXYCODONE HCL 5 MG PO TABS: 5.0000 mg | ORAL_TABLET | Freq: Once | ORAL | Status: DC | PRN

## 2013-09-14 MED ORDER — ONDANSETRON HCL 4 MG/2ML IJ SOLN
INTRAMUSCULAR | Status: DC | PRN
  Administered 2013-09-14: 4 mg via INTRAVENOUS

## 2013-09-14 SURGICAL SUPPLY — 44 items
APL SKNCLS STERI-STRIP NONHPOA (GAUZE/BANDAGES/DRESSINGS) ×1
ARMBAND PINK RESTRICT EXTREMIT (MISCELLANEOUS) ×2 IMPLANT
BENZOIN TINCTURE PRP APPL 2/3 (GAUZE/BANDAGES/DRESSINGS) ×2 IMPLANT
CANISTER SUCTION 2500CC (MISCELLANEOUS) ×2 IMPLANT
CLIP LIGATING EXTRA MED SLVR (CLIP) ×2 IMPLANT
CLIP LIGATING EXTRA SM BLUE (MISCELLANEOUS) ×2 IMPLANT
COVER PROBE W GEL 5X96 (DRAPES) ×3 IMPLANT
COVER SURGICAL LIGHT HANDLE (MISCELLANEOUS) ×2 IMPLANT
DECANTER SPIKE VIAL GLASS SM (MISCELLANEOUS) ×3 IMPLANT
ELECT REM PT RETURN 9FT ADLT (ELECTROSURGICAL) ×2
ELECTRODE REM PT RTRN 9FT ADLT (ELECTROSURGICAL) ×1 IMPLANT
GAUZE SPONGE 2X2 8PLY STRL LF (GAUZE/BANDAGES/DRESSINGS) IMPLANT
GEL ULTRASOUND 20GR AQUASONIC (MISCELLANEOUS) IMPLANT
GLOVE BIOGEL PI IND STRL 6.5 (GLOVE) IMPLANT
GLOVE BIOGEL PI IND STRL 7.0 (GLOVE) IMPLANT
GLOVE BIOGEL PI IND STRL 7.5 (GLOVE) IMPLANT
GLOVE BIOGEL PI INDICATOR 6.5 (GLOVE) ×3
GLOVE BIOGEL PI INDICATOR 7.0 (GLOVE) ×1
GLOVE BIOGEL PI INDICATOR 7.5 (GLOVE) ×1
GLOVE ECLIPSE 7.0 STRL STRAW (GLOVE) ×1 IMPLANT
GLOVE SS BIOGEL STRL SZ 7 (GLOVE) IMPLANT
GLOVE SS BIOGEL STRL SZ 7.5 (GLOVE) ×1 IMPLANT
GLOVE SUPERSENSE BIOGEL SZ 7 (GLOVE) ×1
GLOVE SUPERSENSE BIOGEL SZ 7.5 (GLOVE) ×1
GOWN STRL REUS W/ TWL LRG LVL3 (GOWN DISPOSABLE) ×3 IMPLANT
GOWN STRL REUS W/TWL LRG LVL3 (GOWN DISPOSABLE) ×8
KIT BASIN OR (CUSTOM PROCEDURE TRAY) ×2 IMPLANT
KIT ROOM TURNOVER OR (KITS) ×2 IMPLANT
NDL HYPO 25GX1X1/2 BEV (NEEDLE) ×1 IMPLANT
NEEDLE HYPO 25GX1X1/2 BEV (NEEDLE) ×2 IMPLANT
NS IRRIG 1000ML POUR BTL (IV SOLUTION) ×2 IMPLANT
PACK CV ACCESS (CUSTOM PROCEDURE TRAY) ×2 IMPLANT
PAD ARMBOARD 7.5X6 YLW CONV (MISCELLANEOUS) ×4 IMPLANT
SPONGE GAUZE 2X2 STER 10/PKG (GAUZE/BANDAGES/DRESSINGS) ×1
SPONGE GAUZE 4X4 12PLY (GAUZE/BANDAGES/DRESSINGS) ×2 IMPLANT
STRIP CLOSURE SKIN 1/2X4 (GAUZE/BANDAGES/DRESSINGS) ×2 IMPLANT
SUT PROLENE 6 0 CC (SUTURE) ×2 IMPLANT
SUT VIC AB 3-0 SH 27 (SUTURE) ×2
SUT VIC AB 3-0 SH 27X BRD (SUTURE) ×1 IMPLANT
TAPE CLOTH SURG 4X10 WHT LF (GAUZE/BANDAGES/DRESSINGS) ×1 IMPLANT
TOWEL OR 17X24 6PK STRL BLUE (TOWEL DISPOSABLE) ×2 IMPLANT
TOWEL OR 17X26 10 PK STRL BLUE (TOWEL DISPOSABLE) ×2 IMPLANT
UNDERPAD 30X30 INCONTINENT (UNDERPADS AND DIAPERS) ×2 IMPLANT
WATER STERILE IRR 1000ML POUR (IV SOLUTION) ×2 IMPLANT

## 2013-09-14 NOTE — Anesthesia Postprocedure Evaluation (Signed)
Anesthesia Post Note  Patient: Kenneth [name]  Procedure(s) Performed: Procedure(s) (LRB): ARTERIOVENOUS (AV) FISTULA CREATION - CIMINO (Left)  Anesthesia type: MAC  Patient location: PACU  Post pain: Pain level controlled  Post assessment: Patient's Cardiovascular Status Stable  Last Vitals:  Filed Vitals:   09/14/13 0858  BP: 118/46  Pulse: 60  Temp:   Resp: 19    Post vital signs: Reviewed and stable  Level of consciousness: sedated  Complications: No apparent anesthesia complications

## 2013-09-14 NOTE — Anesthesia Preprocedure Evaluation (Addendum)
Anesthesia Evaluation  Patient identified by MRN, date of birth, ID band Patient awake    Reviewed: Allergy & Precautions, H&P , NPO status , Patient's Chart, lab work & pertinent test results, reviewed documented beta blocker date and time   History of Anesthesia Complications Negative for: history of anesthetic complications  Airway Mallampati: I TM Distance: >3 FB Neck ROM: Full    Dental  (+) Teeth Intact and Dental Advisory Given   Pulmonary neg pulmonary ROS,    Pulmonary exam normal       Cardiovascular hypertension,  Pt denies taking any rx for high blood pressure   Neuro/Psych negative psych ROS   GI/Hepatic Neg liver ROS, GERD-  Medicated and Controlled,  Endo/Other  diabetes, Well Controlled, Type 2, Insulin DependentMorbid obesity  Renal/GU Renal disease     Musculoskeletal   Abdominal   Peds  Hematology   Anesthesia Other Findings   Reproductive/Obstetrics                        Anesthesia Physical Anesthesia Plan  ASA: III  Anesthesia Plan: MAC   Post-op Pain Management:    Induction: Intravenous  Airway Management Planned: Simple Face Mask  Additional Equipment:   Intra-op Plan:   Post-operative Plan:   Informed Consent: I have reviewed the patients History and Physical, chart, labs and discussed the procedure including the risks, benefits and alternatives for the proposed anesthesia with the patient or authorized representative who has indicated his/her understanding and acceptance.   Dental advisory given  Plan Discussed with: CRNA, Anesthesiologist and Surgeon  Anesthesia Plan Comments:        Anesthesia Quick Evaluation

## 2013-09-14 NOTE — Op Note (Signed)
    OPERATIVE REPORT  DATE OF SURGERY: 09/14/2013  PATIENT: TRESTON COKER, 63 y.o. male MRN: [id number]  DOB: 04-Mar-1951  PRE-OPERATIVE DIAGNOSIS: Chronic renal insufficiency  POST-OPERATIVE DIAGNOSIS:  Same  PROCEDURE: Left wrist Cimino radiocephalic fistula  SURGEON:  Curt Jews, M.D.  PHYSICIAN ASSISTANT: Collins  ANESTHESIA:  Local with sedation  EBL: Minimal ml  Total I/O In: 550 [I.V.:550] Out: 5 [Blood:5]  BLOOD ADMINISTERED: None  DRAINS: None  SPECIMEN: None  COUNTS CORRECT:  YES  PLAN OF CARE: PACU   PATIENT DISPOSITION:  PACU - hemodynamically stable  PROCEDURE DETAILS: The patient was taken up and placed supine position where the area of the left arm and left wrist were prepped and draped in the usual sterile fashion. SonoSite ultrasound was used to visualize the cephalic vein which was excellent caliber at the wrist. The incision was made between the level of the cephalic vein and the radial artery. Tributary branches of the cephalic vein were ligated with 30 and 4-0 silk ties and divided. The vein was ligated distally and divided and gently dilated. This was of very good caliber. The radial artery was exposed through the same incision. The artery was occluded proximally and distally was opened with an 11 blade and extended longitudinally with Potts scissors. The vein was cut to appropriate length and spatulated and sewn end-to-side to the artery with a running 6-0 Prolene suture. Usual flushing maneuvers were undertaken anastomosis was completed. Clamps removed and excellent thrill was noted. The wounds were irrigated with saline. Hemostasis obtained with artery. The wounds were closed with 3-0 Vicryl in the subcutaneous and subcuticular tissue. Benzoin Steri-Strips were applied.   Curt Jews, M.D. 09/14/2013 9:03 AM

## 2013-09-14 NOTE — H&P (View-Only) (Signed)
Patient name: Kenneth [name] MRN: [id number] DOB: Apr 21, 1951 Sex: male   Referred by: Moshe Cipro  Reason for referral:  Chief Complaint  Patient presents with  . Follow-up    HD access placement referred by Dr. Moshe Cipro    HISTORY OF PRESENT ILLNESS: Patient has today for discussion regarding access for hemodialysis. He has a long history of moderate her renal insufficiency and recently has had a significant progression stage IV. Most recent creatinine is 3.6. He has never had any hemodialysis in the past. He is here today with his family.   Past Medical History  Diagnosis Date  . Diabetes mellitus without complication   . Renal disorder   . Blood disease     states that he makes too many red blood cells.    Past Surgical History  Procedure Laterality Date  . Hernia repair      History   Social History  . Marital Status: Divorced    Spouse Name: N/A    Number of Children: N/A  . Years of Education: N/A   Occupational History  . Not on file.   Social History Main Topics  . Smoking status: Never Smoker   . Smokeless tobacco: Not on file  . Alcohol Use: No  . Drug Use: No  . Sexual Activity: Not on file   Other Topics Concern  . Not on file   Social History Narrative  . No narrative on file    Family History  Problem Relation Age of Onset  . Cancer Father   . Diabetes Father   . Heart disease Father   . Hypertension Daughter   . Hyperlipidemia Son     Allergies as of 08/28/2013 - Review Complete 08/28/2013  Allergen Reaction Noted  . Penicillins  12/25/2007    Current Outpatient Prescriptions on File Prior to Visit  Medication Sig Dispense Refill  . insulin lispro protamine-lispro (HUMALOG 75/25) (75-25) 100 UNIT/ML SUSP injection Inject 60 Units into the skin daily with breakfast.      . insulin lispro protamine-lispro (HUMALOG 75/25) (75-25) 100 UNIT/ML SUSP injection Inject 15 Units into the skin daily before lunch.      . insulin  lispro protamine-lispro (HUMALOG 75/25) (75-25) 100 UNIT/ML SUSP injection Inject 50 Units into the skin daily with supper.      Marland Kitchen HYDROcodone-acetaminophen (NORCO/VICODIN) 5-325 MG per tablet Take 1 tablet by mouth every 4 (four) hours as needed for pain.  6 tablet  0  . HYDROcodone-acetaminophen (NORCO/VICODIN) 5-325 MG per tablet Take 1 tablet by mouth every 4 (four) hours as needed for pain.  20 tablet  0   No current facility-administered medications on file prior to visit.     REVIEW OF SYSTEMS:  Positives indicated with an "X"  CARDIOVASCULAR:  [ ]  chest pain   [ ]  chest pressure   [ ]  palpitations   [ ]  orthopnea   [ ]  dyspnea on exertion   [ ]  claudication   [ ]  rest pain   [ ]  DVT   [ ]  phlebitis PULMONARY:   [ ]  productive cough   [ ]  asthma   [ ]  wheezing NEUROLOGIC:   [ ]  weakness  [ ]  paresthesias  [ ]  aphasia  [ ]  amaurosis  [ ]  dizziness HEMATOLOGIC:   [ ]  bleeding problems   [ ]  clotting disorders MUSCULOSKELETAL:  [ ]  joint pain   [ ]  joint swelling GASTROINTESTINAL: [ ]   blood in stool  [ ]   hematemesis GENITOURINARY:  [ ]   dysuria  [ ]   hematuria PSYCHIATRIC:  [ ]  history of major depression INTEGUMENTARY:  [ ]  rashes  [ ]  ulcers CONSTITUTIONAL:  [ ]  fever   [ ]  chills  PHYSICAL EXAMINATION:  General: The patient is a well-nourished male, in no acute distress. Vital signs are BP 160/65  Pulse 60  Resp 18  Ht 5\' 7"  (1.702 m)  Wt 253 lb 14.4 oz (115.168 kg)  BMI 39.76 kg/m2 Pulmonary: There is a goitch od air exchange bilaterally . Extremities There are no major deformities.  There is no significant extremity pain. Neurologic: No focal weakness or paresthesias are detected, Skin: There are no ulcer or rashes noted. Psychiatric: The patient has normal affect. Cardiovascular: There is a regular rate and rhythm without significant murmur appreciated. He does have 2+ radial pulses bilaterally. He does have moderately well-developed cephalic veins on the surface  bilaterally.  VVS Vascular Lab Studies:  Ordered and Independently Reviewed this revealed normal arterial flow to both hands. His cephalic vein map revealed a good caliber throughout its course  Impression and Plan:  Rest of chronic renal insufficiency and need for hemodialysis access. After long discussion with the patient's family explaining the access options of hemodialysis catheter, AV fistula an AV graft. He does have adequate cephalic vein for attempted AV fistula. I recommend that we proceed with this as soon as possible perineum for access for hemodialysis. He will notify us when he can arrange transportation with his family or outpatient left arm AV fistula    EARLY, TODD [facility] Office: [phone]

## 2013-09-14 NOTE — Interval H&P Note (Signed)
History and Physical Interval Note:  09/14/2013 7:26 AM  Kenneth [name]  has presented today for surgery, with the diagnosis of Chronic renal failure  The various methods of treatment have been discussed with the patient and family. After consideration of risks, benefits and other options for treatment, the patient has consented to  Procedure(s): ARTERIOVENOUS (AV) FISTULA CREATION - CIMINO (Left) as a surgical intervention .  The patient's history has been reviewed, patient examined, no change in status, stable for surgery.  I have reviewed the patient's chart and labs.  Questions were answered to the patient's satisfaction.     EARLY, TODD

## 2013-09-14 NOTE — Preoperative (Signed)
Beta Blockers   Reason not to administer Beta Blockers:Not Applicable 

## 2013-09-14 NOTE — Transfer of Care (Signed)
Immediate Anesthesia Transfer of Care Note  Patient: Kenneth [name]  Procedure(s) Performed: Procedure(s): ARTERIOVENOUS (AV) FISTULA CREATION - CIMINO (Left)  Patient Location: PACU  Anesthesia Type:MAC  Level of Consciousness: awake, alert , oriented and patient cooperative  Airway & Oxygen Therapy: Patient Spontanous Breathing and Patient connected to nasal cannula oxygen  Post-op Assessment: Report given to PACU RN, Post -op Vital signs reviewed and stable and Patient moving all extremities  Post vital signs: Reviewed and stable  Complications: No apparent anesthesia complications

## 2013-09-17 ENCOUNTER — Telehealth: Payer: Self-pay | Admitting: Vascular Surgery

## 2013-09-17 ENCOUNTER — Encounter (HOSPITAL_COMMUNITY): Payer: Self-pay | Admitting: Vascular Surgery

## 2013-09-17 NOTE — Telephone Encounter (Addendum)
Message copied by Gena Fray on Mon Sep 17, 2013  2:45 PM ------      Message from: Mena Goes      Created: Fri Sep 14, 2013  4:37 PM      Regarding: Schedule                   ----- Message -----         From: Rosetta Posner, MD         Sent: 09/14/2013   9:05 AM           To: Vvs Charge Pool            Left Cimino AV fistula assistant [employer]. I need to see him in one month in the office. ------  09/17/13: lvm for pt with appt date of 10/16/13 @ 11:00am , dpm

## 2013-09-20 ENCOUNTER — Ambulatory Visit: Payer: BC Managed Care – PPO

## 2013-09-20 DIAGNOSIS — Z48812 Encounter for surgical aftercare following surgery on the circulatory system: Secondary | ICD-10-CM

## 2013-09-20 NOTE — Progress Notes (Signed)
Pt. came to office for left forearm incision check.  Stated he was concerned about the redness of the incision yesterday; denied any drainage.  Voiced concern about infection since he is diabetic. Upon assessment of the left forearm, noted steri strips intact over the incisional area.  Noted small area of dk. red /purple color at proximal and distal end of the left wrist incision.  Mild swelling present in the left forearm; no drainage noted from incision.  VS: T. 97.5, P.60, R. 20, BP 166/74.   Reassured pt. that the surgical site looks good.  No signs of infection.  Cleansed incisional area with Elodia Florence wound cleaner.  Left incisional area open to air.  Encouraged to keep steri-strips in place until they fall off.   Verb. Understanding.  Encouraged to call if any concerns.

## 2013-10-15 ENCOUNTER — Encounter: Payer: Self-pay | Admitting: Vascular Surgery

## 2013-10-16 ENCOUNTER — Ambulatory Visit (INDEPENDENT_AMBULATORY_CARE_PROVIDER_SITE_OTHER): Payer: BC Managed Care – PPO | Admitting: Vascular Surgery

## 2013-10-16 ENCOUNTER — Ambulatory Visit (HOSPITAL_COMMUNITY)
Admission: RE | Admit: 2013-10-16 | Discharge: 2013-10-16 | Disposition: A | Discharge status: Home / Self Care | Payer: BC Managed Care – PPO | Source: Ambulatory Visit | Attending: Vascular Surgery | Admitting: Vascular Surgery

## 2013-10-16 ENCOUNTER — Encounter: Payer: Self-pay | Admitting: Vascular Surgery

## 2013-10-16 VITALS — BP 158/86 | HR 60 | Resp 16 | Ht 67.0 in | Wt 256.0 lb

## 2013-10-16 DIAGNOSIS — N186 End stage renal disease: Secondary | ICD-10-CM

## 2013-10-16 DIAGNOSIS — Z4931 Encounter for adequacy testing for hemodialysis: Secondary | ICD-10-CM

## 2013-10-16 NOTE — Progress Notes (Signed)
Here today for followup of left wrist Cimino radiocephalic fistula by myself on 09/14/2013. He has had complete resolution of any soreness remnant of surgery. He reports that he did not R. any pain medication.  Physical exam the incision is well-healed with typical healing ridge. There is an excellent thrill. The vein is superficial. He does have good Josede Cicero maturation throughout the course.  Duplex of this today reveals size and the carina 5 mm range throughout its course. There is some elevated velocities near the arteriovenous anastomosis. The vein is a good thrill even up into the forearm.  Impression and plan: Good Rayola Everhart maturation of AV fistula. Discuss this at length with the patient. Explained that he has no limitations as far physical activity. He is specifically concerned about the playing golff explained that this was fine. We will see him again in 2 months for continued followup. He has no eminent plans for need for hemodialysis

## 2013-11-05 ENCOUNTER — Other Ambulatory Visit (HOSPITAL_COMMUNITY): Payer: Self-pay | Admitting: *Deleted

## 2013-11-06 ENCOUNTER — Encounter (HOSPITAL_COMMUNITY)
Admission: RE | Admit: 2013-11-06 | Discharge: 2013-11-06 | Disposition: A | Discharge status: Home / Self Care | Payer: BC Managed Care – PPO | Source: Ambulatory Visit | Attending: Nephrology | Admitting: Nephrology

## 2013-11-06 DIAGNOSIS — N184 Chronic kidney disease, stage 4 (severe): Secondary | ICD-10-CM | POA: Insufficient documentation

## 2013-11-06 DIAGNOSIS — D45 Polycythemia vera: Secondary | ICD-10-CM | POA: Insufficient documentation

## 2013-11-06 NOTE — Progress Notes (Signed)
therapeutic phlebotomy performed.  PT tolerated procedure well.  500 cc was removed.  Will continue to monitor closely for 30 mins and recheck vital signs

## 2013-12-14 HISTORY — PX: AV FISTULA PLACEMENT: SHX1204

## 2013-12-17 ENCOUNTER — Encounter: Payer: Self-pay | Admitting: Vascular Surgery

## 2013-12-18 ENCOUNTER — Encounter: Payer: Self-pay | Admitting: Vascular Surgery

## 2013-12-18 ENCOUNTER — Ambulatory Visit (INDEPENDENT_AMBULATORY_CARE_PROVIDER_SITE_OTHER): Payer: BC Managed Care – PPO | Admitting: Vascular Surgery

## 2013-12-18 VITALS — BP 168/70 | HR 67 | Ht 67.0 in | Wt 250.4 lb

## 2013-12-18 DIAGNOSIS — N186 End stage renal disease: Secondary | ICD-10-CM

## 2013-12-18 NOTE — Progress Notes (Signed)
Today for continued followup of his AV fistula. The fistula creation on 09/14/2013 in his left wrist. I seen in 2 months ago and had good Kenneth [name] maceration. She is a final followup.  On physical exam his left wrist incision is completely healed. He does have excellent forearm fistula. The size is very nice and he does have a very superficial vein with a very straight lying course.  Impression and plan good Kenneth [name] results of left [place] AV fistula. I feel he is a very high likelihood this will be successful for dialysis when he progresses to end-stage renal disease. We will see him again on an as-needed basis

## 2014-01-01 ENCOUNTER — Encounter: Payer: Self-pay | Admitting: Nephrology

## 2015-01-09 DIAGNOSIS — Z48812 Encounter for surgical aftercare following surgery on the circulatory system: Secondary | ICD-10-CM | POA: Insufficient documentation

## 2015-02-25 ENCOUNTER — Encounter (HOSPITAL_COMMUNITY): Payer: Self-pay | Admitting: Emergency Medicine

## 2015-02-25 ENCOUNTER — Emergency Department (HOSPITAL_COMMUNITY): Payer: BLUE CROSS/BLUE SHIELD

## 2015-02-25 ENCOUNTER — Observation Stay (HOSPITAL_COMMUNITY)
Admission: EM | Admit: 2015-02-25 | Discharge: 2015-02-26 | Disposition: A | Discharge status: Home / Self Care | Payer: BLUE CROSS/BLUE SHIELD | Attending: Internal Medicine | Admitting: Internal Medicine

## 2015-02-25 DIAGNOSIS — Z794 Long term (current) use of insulin: Secondary | ICD-10-CM | POA: Diagnosis not present

## 2015-02-25 DIAGNOSIS — R4182 Altered mental status, unspecified: Secondary | ICD-10-CM | POA: Diagnosis present

## 2015-02-25 DIAGNOSIS — N186 End stage renal disease: Secondary | ICD-10-CM | POA: Diagnosis not present

## 2015-02-25 DIAGNOSIS — I120 Hypertensive chronic kidney disease with stage 5 chronic kidney disease or end stage renal disease: Secondary | ICD-10-CM | POA: Diagnosis not present

## 2015-02-25 DIAGNOSIS — M1990 Unspecified osteoarthritis, unspecified site: Secondary | ICD-10-CM | POA: Insufficient documentation

## 2015-02-25 DIAGNOSIS — G5600 Carpal tunnel syndrome, unspecified upper limb: Secondary | ICD-10-CM | POA: Diagnosis not present

## 2015-02-25 DIAGNOSIS — E1121 Type 2 diabetes mellitus with diabetic nephropathy: Secondary | ICD-10-CM | POA: Diagnosis present

## 2015-02-25 DIAGNOSIS — Z8719 Personal history of other diseases of the digestive system: Secondary | ICD-10-CM | POA: Diagnosis not present

## 2015-02-25 DIAGNOSIS — E785 Hyperlipidemia, unspecified: Secondary | ICD-10-CM | POA: Insufficient documentation

## 2015-02-25 DIAGNOSIS — G459 Transient cerebral ischemic attack, unspecified: Principal | ICD-10-CM | POA: Diagnosis present

## 2015-02-25 DIAGNOSIS — Z880 Allergy status to penicillin: Secondary | ICD-10-CM | POA: Diagnosis not present

## 2015-02-25 DIAGNOSIS — I10 Essential (primary) hypertension: Secondary | ICD-10-CM | POA: Diagnosis present

## 2015-02-25 DIAGNOSIS — Z79899 Other long term (current) drug therapy: Secondary | ICD-10-CM | POA: Insufficient documentation

## 2015-02-25 DIAGNOSIS — Z992 Dependence on renal dialysis: Secondary | ICD-10-CM | POA: Insufficient documentation

## 2015-02-25 HISTORY — DX: End stage renal disease: N18.6

## 2015-02-25 LAB — BASIC METABOLIC PANEL
ANION GAP: 10 (ref 5–15)
BUN: 42 mg/dL — AB (ref 6–20)
CALCIUM: 8.3 mg/dL — AB (ref 8.9–10.3)
CO2: 26 mmol/L (ref 22–32)
Chloride: 100 mmol/L — ABNORMAL LOW (ref 101–111)
Creatinine, Ser: 6.2 mg/dL — ABNORMAL HIGH (ref 0.61–1.24)
GFR calc Af Amer: 10 mL/min — ABNORMAL LOW (ref >60)
GFR, EST NON AFRICAN AMERICAN: 9 mL/min — AB (ref >60)
Glucose, Bld: 268 mg/dL — ABNORMAL HIGH (ref 65–99)
POTASSIUM: 3.6 mmol/L (ref 3.5–5.1)
Sodium: 136 mmol/L (ref 135–145)

## 2015-02-25 LAB — URINALYSIS, ROUTINE W REFLEX MICROSCOPIC
Bilirubin Urine: NEGATIVE
GLUCOSE, UA: 500 mg/dL — AB
KETONES UR: NEGATIVE mg/dL
Leukocytes, UA: NEGATIVE
NITRITE: NEGATIVE
Protein, ur: >300 mg/dL — AB
Specific Gravity, Urine: 1.02 (ref 1.005–1.030)
Urobilinogen, UA: 0.2 mg/dL (ref 0.0–1.0)
pH: 7.5 (ref 5.0–8.0)

## 2015-02-25 LAB — CBC WITH DIFFERENTIAL/PLATELET
BASOS PCT: 1 % (ref 0–1)
Basophils Absolute: 0 10*3/uL (ref 0.0–0.1)
EOS ABS: 0.2 10*3/uL (ref 0.0–0.7)
Eosinophils Relative: 3 % (ref 0–5)
HCT: 37 % — ABNORMAL LOW (ref 39.0–52.0)
Hemoglobin: 12.7 g/dL — ABNORMAL LOW (ref 13.0–17.0)
Lymphocytes Relative: 27 % (ref 12–46)
Lymphs Abs: 1.7 10*3/uL (ref 0.7–4.0)
MCH: 29.7 pg (ref 26.0–34.0)
MCHC: 34.3 g/dL (ref 30.0–36.0)
MCV: 86.4 fL (ref 78.0–100.0)
MONO ABS: 0.4 10*3/uL (ref 0.1–1.0)
Monocytes Relative: 7 % (ref 3–12)
Neutro Abs: 3.9 10*3/uL (ref 1.7–7.7)
Neutrophils Relative %: 62 % (ref 43–77)
Platelets: 112 10*3/uL — ABNORMAL LOW (ref 150–400)
RBC: 4.28 MIL/uL (ref 4.22–5.81)
RDW: 13.8 % (ref 11.5–15.5)
WBC: 6.2 10*3/uL (ref 4.0–10.5)

## 2015-02-25 LAB — URINE MICROSCOPIC-ADD ON

## 2015-02-25 LAB — CBG MONITORING, ED: Glucose-Capillary: 247 mg/dL — ABNORMAL HIGH (ref 65–99)

## 2015-02-25 MED ORDER — CALCIUM ACETATE (PHOS BINDER) 667 MG PO CAPS
667.0000 mg | ORAL_CAPSULE | Freq: Three times a day (TID) | ORAL | Status: DC
  Administered 2015-02-25 – 2015-02-26 (×3): 667 mg via ORAL
  Filled 2015-02-25 (×3): qty 1

## 2015-02-25 MED ORDER — INSULIN DETEMIR 100 UNIT/ML ~~LOC~~ SOLN
60.0000 [IU] | Freq: Every day | SUBCUTANEOUS | Status: DC
  Administered 2015-02-26: 60 [IU] via SUBCUTANEOUS
  Filled 2015-02-25 (×2): qty 0.6

## 2015-02-25 MED ORDER — SULINDAC 200 MG PO TABS
200.0000 mg | ORAL_TABLET | Freq: Two times a day (BID) | ORAL | Status: DC | PRN
  Filled 2015-02-25: qty 1

## 2015-02-25 MED ORDER — RENA-VITE PO TABS
1.0000 | ORAL_TABLET | Freq: Every day | ORAL | Status: DC
  Administered 2015-02-26: 1 via ORAL
  Filled 2015-02-25: qty 1

## 2015-02-25 MED ORDER — CALCIUM ACETATE 667 MG PO CAPS: 667.0000 mg | ORAL_CAPSULE | Freq: Four times a day (QID) | ORAL | Status: DC

## 2015-02-25 MED ORDER — SIMVASTATIN 20 MG PO TABS: 40.0000 mg | ORAL_TABLET | Freq: Every evening | ORAL | Status: DC

## 2015-02-25 MED ORDER — INSULIN ASPART 100 UNIT/ML ~~LOC~~ SOLN
0.0000 [IU] | Freq: Three times a day (TID) | SUBCUTANEOUS | Status: DC
  Administered 2015-02-26 (×2): 9 [IU] via SUBCUTANEOUS

## 2015-02-25 MED ORDER — HYDROMORPHONE HCL 1 MG/ML IJ SOLN: 1.0000 mg | Freq: Once | INTRAMUSCULAR | Status: DC

## 2015-02-25 MED ORDER — HYDRALAZINE HCL 20 MG/ML IJ SOLN
5.0000 mg | INTRAMUSCULAR | Status: DC | PRN
  Administered 2015-02-26: 5 mg via INTRAVENOUS
  Filled 2015-02-25: qty 1

## 2015-02-25 MED ORDER — INSULIN ASPART 100 UNIT/ML ~~LOC~~ SOLN: 0.0000 [IU] | Freq: Every day | SUBCUTANEOUS | Status: DC

## 2015-02-25 MED ORDER — LORAZEPAM 2 MG/ML IJ SOLN
1.0000 mg | Freq: Every day | INTRAMUSCULAR | Status: DC | PRN
  Administered 2015-02-25: 1 mg via INTRAVENOUS
  Filled 2015-02-25: qty 1

## 2015-02-25 MED ORDER — CLOPIDOGREL BISULFATE 75 MG PO TABS
75.0000 mg | ORAL_TABLET | Freq: Every day | ORAL | Status: DC
  Administered 2015-02-25 – 2015-02-26 (×2): 75 mg via ORAL
  Filled 2015-02-25 (×2): qty 1

## 2015-02-25 MED ORDER — INSULIN DETEMIR 100 UNIT/ML FLEXPEN: 60.0000 [IU] | PEN_INJECTOR | Freq: Every morning | SUBCUTANEOUS | Status: DC

## 2015-02-25 MED ORDER — STROKE: EARLY STAGES OF RECOVERY BOOK
Freq: Once | Status: AC
  Administered 2015-02-26: 11:00:00
  Filled 2015-02-25: qty 1

## 2015-02-25 NOTE — H&P (Signed)
Kenneth [name] is an 64 y.o. male.    Dr. Almond Lint ([place] , pcp) Erling Cruz (nephrologist)   Chief Complaint: confusion and slurred speech HPI: 64 yo male with dm2, htn, ESRD on HD M, W, F, apparently had confusion and slurred speech, x 30 minutes,  Starting somewhere between 12-1 pm today.  Pt denies vision change, focal weakness, numbness, tingling.   Pt was recently started on sulindac for arthritis but this hasn't helped much.  Pt was brought to ED.  CT brain negative for acute process.  Pt will be admitted for TIA.    Past Medical History  Diagnosis Date  . Diabetes mellitus without complication   . Renal disorder   . Blood disease     states that he makes too many red blood cells.  . Hypertension     history of, off medication now  . Hyperlipidemia   . GERD (gastroesophageal reflux disease)     history  . Arthritis   . Carpal tunnel syndrome   . ESRD (end stage renal disease)     on Dialysis M, W, F    Past Surgical History  Procedure Laterality Date  . Hernia repair  1995    umbiccal  . Hand surgery Right     tendon and muscle repair after injury  . Av fistula placement Left 09/14/2013    Procedure: ARTERIOVENOUS (AV) [place];  Surgeon: Rosetta Posner, MD;  Location: [facility] OR;  Service: Vascular;  Laterality: Left;    Family History  Problem Relation Age of Onset  . Cancer Father   . Diabetes Father   . Heart disease Father     Heart Disease before age 7  . Heart attack Father   . Hypertension Daughter   . Hyperlipidemia Son   . Glaucoma Mother    Social History:  reports that he has never smoked. He has never used smokeless tobacco. He reports that he does not drink alcohol or use illicit drugs.  Allergies:  Allergies  Allergen Reactions  . Penicillins Other (See Comments)     Family history of allergies and hence he does not use this - states mom's MD told him never to take it.   Medications reviewed   Results for orders  placed or performed during the hospital encounter of 02/25/15 (from the past 48 hour(s))  CBG monitoring, ED     Status: Abnormal   Collection Time: 02/25/15  4:20 PM  Result Value Ref Range   Glucose-Capillary 247 (H) 65 - 99 mg/dL  CBC with Differential     Status: Abnormal   Collection Time: 02/25/15  5:12 PM  Result Value Ref Range   WBC 6.2 4.0 - 10.5 K/uL   RBC 4.28 4.22 - 5.81 MIL/uL   Hemoglobin 12.7 (L) 13.0 - 17.0 g/dL   HCT 37.0 (L) 39.0 - 52.0 %   MCV 86.4 78.0 - 100.0 fL   MCH 29.7 26.0 - 34.0 pg   MCHC 34.3 30.0 - 36.0 g/dL   RDW 13.8 11.5 - 15.5 %   Platelets 112 (L) 150 - 400 K/uL    Comment: SPECIMEN CHECKED FOR CLOTS PLATELET COUNT CONFIRMED BY SMEAR    Neutrophils Relative % 62 43 - 77 %   Neutro Abs 3.9 1.7 - 7.7 K/uL   Lymphocytes Relative 27 12 - 46 %   Lymphs Abs 1.7 0.7 - 4.0 K/uL   Monocytes Relative 7 3 - 12 %   Monocytes  Absolute 0.4 0.1 - 1.0 K/uL   Eosinophils Relative 3 0 - 5 %   Eosinophils Absolute 0.2 0.0 - 0.7 K/uL   Basophils Relative 1 0 - 1 %   Basophils Absolute 0.0 0.0 - 0.1 K/uL  Basic metabolic panel     Status: Abnormal   Collection Time: 02/25/15  5:12 PM  Result Value Ref Range   Sodium 136 135 - 145 mmol/L   Potassium 3.6 3.5 - 5.1 mmol/L   Chloride 100 (L) 101 - 111 mmol/L   CO2 26 22 - 32 mmol/L   Glucose, Bld 268 (H) 65 - 99 mg/dL   BUN 42 (H) 6 - 20 mg/dL   Creatinine, Ser 6.20 (H) 0.61 - 1.24 mg/dL   Calcium 8.3 (L) 8.9 - 10.3 mg/dL   GFR calc non Af Amer 9 (L) >60 mL/min   GFR calc Af Amer 10 (L) >60 mL/min    Comment: (NOTE) The eGFR has been calculated using the CKD EPI equation. This calculation has not been validated in all clinical situations. eGFR's persistently <60 mL/min signify possible Chronic Kidney Disease.    Anion gap 10 5 - 15  Urinalysis, Routine w reflex microscopic (not at [facility])     Status: Abnormal   Collection Time: 02/25/15  6:31 PM  Result Value Ref Range   Color, Urine YELLOW YELLOW    APPearance CLEAR CLEAR   Specific Gravity, Urine 1.020 1.005 - 1.030   pH 7.5 5.0 - 8.0   Glucose, UA 500 (A) NEGATIVE mg/dL   Hgb urine dipstick MODERATE (A) NEGATIVE   Bilirubin Urine NEGATIVE NEGATIVE   Ketones, ur NEGATIVE NEGATIVE mg/dL   Protein, ur >300 (A) NEGATIVE mg/dL   Urobilinogen, UA 0.2 0.0 - 1.0 mg/dL   Nitrite NEGATIVE NEGATIVE   Leukocytes, UA NEGATIVE NEGATIVE  Urine microscopic-add on     Status: None   Collection Time: 02/25/15  6:31 PM  Result Value Ref Range   Squamous Epithelial / LPF RARE RARE   WBC, UA 3-6 <3 WBC/hpf   RBC / HPF 7-10 <3 RBC/hpf   Bacteria, UA RARE RARE   Dg Chest 2 View  02/25/2015   CLINICAL DATA:  Mid and right chest pain.  EXAM: CHEST - 2 VIEW  COMPARISON:  09/14/2013  FINDINGS: The heart size and mediastinal contours are within normal limits. Stable mild elevation of the right hemidiaphragm. There is no evidence of pulmonary edema, consolidation, pneumothorax, nodule or pleural fluid. Stable thoracic spondylosis.  IMPRESSION: No active disease.   Electronically Signed   By: Aletta Edouard M.D.   On: 02/25/2015 17:52   Dg Lumbar Spine Complete  02/25/2015   CLINICAL DATA:  Low back pain.  EXAM: LUMBAR SPINE - COMPLETE 4+ VIEW  COMPARISON:  February 11, 2013.  FINDINGS: No fracture is noted. Minimal grade 1 anterolisthesis is noted secondary to posterior facet joint hypertrophy. Anterior osteophyte formation is noted at L1-2, L2-3 and L3-4. Mild disc space narrowing is noted at L1-2 and L2-3.  IMPRESSION: Degenerative changes as described above. No acute abnormality seen in the lumbar spine.   Electronically Signed   By: Marijo Conception, M.D.   On: 02/25/2015 17:54   Ct Head Wo Contrast  02/25/2015   CLINICAL DATA:  64 year old male with peripheral confusion and slurred speech  EXAM: CT HEAD WITHOUT CONTRAST  TECHNIQUE: Contiguous axial images were obtained from the base of the skull through the vertex without intravenous contrast.  COMPARISON:   None.  FINDINGS: The ventricles and sulci are appropriate in size for the patient's age. Minimal periventricular and deep white matter areas of hypodensity is compatible with chronic skin changes. There is no intracranial hemorrhage. No mass effect or midline shift identified.There is no extra-axial fluid collection.  The visualized paranasal sinuses and mastoid air cells are well aerated. The calvarium is intact.  IMPRESSION: No acute intracranial pathology.   Electronically Signed   By: Anner Crete M.D.   On: 02/25/2015 17:34    Review of Systems  Constitutional: Negative.   HENT: Negative.   Eyes: Negative.   Respiratory: Negative.   Cardiovascular: Negative.   Gastrointestinal: Negative.   Genitourinary: Negative.   Musculoskeletal: Negative.   Skin: Negative.   Endo/Heme/Allergies: Negative.   Psychiatric/Behavioral: Negative.     Blood pressure 185/75, pulse 61, temperature 97.6 F (36.4 C), temperature source Oral, resp. rate 18, height $RemoveBe'5\' 8"'xAHFpxlJa$  (1.727 m), weight 108.863 kg (240 lb), SpO2 100 %. Physical Exam  Constitutional: He is oriented to person, place, and time. He appears well-developed and well-nourished.  HENT:  Head: Normocephalic and atraumatic.  Mouth/Throat: No oropharyngeal exudate.  Eyes: Conjunctivae and EOM are normal. Pupils are equal, round, and reactive to light. No scleral icterus.  Neck: Normal range of motion. Neck supple. No JVD present. No tracheal deviation present. No thyromegaly present.  Cardiovascular: Normal rate and regular rhythm.  Exam reveals no gallop and no friction rub.   No murmur heard. Respiratory: Effort normal and breath sounds normal. No respiratory distress. He has no wheezes. He has no rales.  GI: Soft. Bowel sounds are normal. He exhibits no distension. There is no tenderness. There is no rebound and no guarding.  Musculoskeletal: Normal range of motion. He exhibits no edema or tenderness.  Lymphadenopathy:    He has no cervical  adenopathy.  Neurological: He is alert and oriented to person, place, and time. He has normal reflexes. He displays normal reflexes. No cranial nerve deficit. He exhibits normal muscle tone. Coordination normal.  Skin: Skin is warm and dry. No rash noted. No erythema. No pallor.  Psychiatric: He has a normal mood and affect. His behavior is normal. Judgment and thought content normal.     Assessment/Plan TIA Start on plavix $RemoveB'75mg'YlIYqmFJ$  po qday in lieu of  Cont simvastatin  Check MRI brain without contrast Check carotid u/s Check cardiac echo Check lipid  ESRD on HD on M, W, F Please consult nephrologist Check cmp in am  Dm2 fsbs ac and qhs, iss  Anemia Repeat cbc in am  DVT prophylaxis:  SCD  Jani Gravel 02/25/2015, 7:38 PM

## 2015-02-25 NOTE — ED Notes (Signed)
Had confused at 1200 today with confusion, with slurred speech.  Dr Lacinda Axon in and speak with pt and family.

## 2015-02-25 NOTE — ED Provider Notes (Signed)
CSN: [id number]     Arrival date & time 02/25/15  1537 History   First MD Initiated Contact with Patient 02/25/15 1633     Chief Complaint  Patient presents with  . Altered Mental Status     (Consider location/radiation/quality/duration/timing/severity/associated sxs/prior Treatment) HPI..... Episode of garbled speech and difficulty with word finding approximate 12:30 today lasting 30 minutes. Symptoms now resolved. Cardiac risk factors include diabetes, hypertension, end-stage renal disease. No history of stroke or TIA. Severity is moderate. Nothing makes symptoms better or worse.  Past Medical History  Diagnosis Date  . Diabetes mellitus without complication   . Renal disorder   . Blood disease     states that he makes too many red blood cells.  . Hypertension     history of, off medication now  . Hyperlipidemia   . GERD (gastroesophageal reflux disease)     history  . Arthritis   . Carpal tunnel syndrome    Past Surgical History  Procedure Laterality Date  . Hernia repair  1995    umbiccal  . Hand surgery Right     tendon and muscle repair after injury  . Av fistula placement Left 09/14/2013    Procedure: ARTERIOVENOUS (AV) [place];  Surgeon: Rosetta Posner, MD;  Location: [facility] OR;  Service: Vascular;  Laterality: Left;   Family History  Problem Relation Age of Onset  . Cancer Father   . Diabetes Father   . Heart disease Father     Heart Disease before age 94  . Heart attack Father   . Hypertension Daughter   . Hyperlipidemia Son   . Glaucoma Mother    History  Substance Use Topics  . Smoking status: Never Smoker   . Smokeless tobacco: Never Used  . Alcohol Use: No    Review of Systems  All other systems reviewed and are negative.     Allergies  Penicillins  Home Medications   Prior to Admission medications   Medication Sig Start Date End Date Taking? Authorizing Provider  acetaminophen (TYLENOL) 500 MG tablet Take 1,000 mg by mouth  every 8 (eight) hours as needed for mild pain or moderate pain.    Yes Historical Provider, MD  calcium acetate (PHOSLO) 667 MG capsule Take by mouth 4 (four) times daily. To take 4 capsules 4 times daily with meals and 1 capsule with snacks   Yes Historical Provider, MD  Insulin Detemir (LEVEMIR FLEXTOUCH) 100 UNIT/ML Pen Inject 60 Units into the skin every morning.   Yes Historical Provider, MD  multivitamin (RENA-VIT) TABS tablet Take 1 tablet by mouth daily.   Yes Historical Provider, MD  simvastatin (ZOCOR) 40 MG tablet Take 40 mg by mouth every evening.    Yes Historical Provider, MD  sulindac (CLINORIL) 200 MG tablet Take 200 mg by mouth 2 (two) times daily as needed (for pain).  02/18/15  Yes Historical Provider, MD   BP 185/75 mmHg  Pulse 61  Temp(Src) 97.6 F (36.4 C) (Oral)  Resp 18  Ht 5\' 8"  (1.727 m)  Wt 240 lb (108.863 kg)  BMI 36.50 kg/m2  SpO2 100% Physical Exam  Constitutional: He is oriented to person, place, and time. He appears well-developed and well-nourished.  HENT:  Head: Normocephalic and atraumatic.  Eyes: Conjunctivae and EOM are normal. Pupils are equal, round, and reactive to light.  Neck: Normal range of motion. Neck supple.  Cardiovascular: Normal rate and regular rhythm.   Pulmonary/Chest: Effort normal and breath sounds  normal.  Abdominal: Soft. Bowel sounds are normal.  Musculoskeletal: Normal range of motion.  Neurological: He is alert and oriented to person, place, and time.  Skin: Skin is warm and dry.  Psychiatric: He has a normal mood and affect. His behavior is normal.  Nursing note and vitals reviewed.   ED Course  Procedures (including critical care time) Labs Review Labs Reviewed  CBC WITH DIFFERENTIAL/PLATELET - Abnormal; Notable for the following:    Hemoglobin 12.7 (*)    HCT 37.0 (*)    Platelets 112 (*)    All other components within normal limits  BASIC METABOLIC PANEL - Abnormal; Notable for the following:    Chloride 100 (*)     Glucose, Bld 268 (*)    BUN 42 (*)    Creatinine, Ser 6.20 (*)    Calcium 8.3 (*)    GFR calc non Af Amer 9 (*)    GFR calc Af Amer 10 (*)    All other components within normal limits  URINALYSIS, ROUTINE W REFLEX MICROSCOPIC (NOT AT [facility]) - Abnormal; Notable for the following:    Glucose, UA 500 (*)    Hgb urine dipstick MODERATE (*)    Protein, ur >300 (*)    All other components within normal limits  CBG MONITORING, ED - Abnormal; Notable for the following:    Glucose-Capillary 247 (*)    All other components within normal limits  URINE MICROSCOPIC-ADD ON  CBG MONITORING, ED    Imaging Review Dg Chest 2 View  02/25/2015   CLINICAL DATA:  Mid and right chest pain.  EXAM: CHEST - 2 VIEW  COMPARISON:  09/14/2013  FINDINGS: The heart size and mediastinal contours are within normal limits. Stable mild elevation of the right hemidiaphragm. There is no evidence of pulmonary edema, consolidation, pneumothorax, nodule or pleural fluid. Stable thoracic spondylosis.  IMPRESSION: No active disease.   Electronically Signed   By: Aletta Edouard M.D.   On: 02/25/2015 17:52   Dg Lumbar Spine Complete  02/25/2015   CLINICAL DATA:  Low back pain.  EXAM: LUMBAR SPINE - COMPLETE 4+ VIEW  COMPARISON:  February 11, 2013.  FINDINGS: No fracture is noted. Minimal grade 1 anterolisthesis is noted secondary to posterior facet joint hypertrophy. Anterior osteophyte formation is noted at L1-2, L2-3 and L3-4. Mild disc space narrowing is noted at L1-2 and L2-3.  IMPRESSION: Degenerative changes as described above. No acute abnormality seen in the lumbar spine.   Electronically Signed   By: Marijo Conception, M.D.   On: 02/25/2015 17:54   Ct Head Wo Contrast  02/25/2015   CLINICAL DATA:  64 year old male with peripheral confusion and slurred speech  EXAM: CT HEAD WITHOUT CONTRAST  TECHNIQUE: Contiguous axial images were obtained from the base of the skull through the vertex without intravenous contrast.  COMPARISON:   None.  FINDINGS: The ventricles and sulci are appropriate in size for the patient's age. Minimal periventricular and deep white matter areas of hypodensity is compatible with chronic skin changes. There is no intracranial hemorrhage. No mass effect or midline shift identified.There is no extra-axial fluid collection.  The visualized paranasal sinuses and mastoid air cells are well aerated. The calvarium is intact.  IMPRESSION: No acute intracranial pathology.   Electronically Signed   By: Anner Crete M.D.   On: 02/25/2015 17:34     EKG Interpretation   Date/Time:  Tuesday February 25 2015 16:33:08 EDT Ventricular Rate:  65 PR Interval:  167 QRS  Duration: 107 QT Interval:  421 QTC Calculation: 438 R Axis:   -3 Text Interpretation:  Sinus rhythm Probable anteroseptal infarct, recent  Confirmed by Gloriajean Okun  MD, [place]umb[place] ([id number]) on 02/25/2015 4:42:31 PM      MDM   Final diagnoses:  Transient cerebral ischemia, unspecified transient cerebral ischemia type  ESRD (end stage renal disease)    History and physical consistent with TIA. CT head negative. Patient is now symptom free. Admit to observation.    Nat Christen, MD 02/25/15 [phone]

## 2015-02-26 ENCOUNTER — Observation Stay (HOSPITAL_COMMUNITY): Payer: BLUE CROSS/BLUE SHIELD

## 2015-02-26 ENCOUNTER — Observation Stay (HOSPITAL_BASED_OUTPATIENT_CLINIC_OR_DEPARTMENT_OTHER): Payer: BLUE CROSS/BLUE SHIELD

## 2015-02-26 DIAGNOSIS — N186 End stage renal disease: Secondary | ICD-10-CM

## 2015-02-26 DIAGNOSIS — E1121 Type 2 diabetes mellitus with diabetic nephropathy: Secondary | ICD-10-CM

## 2015-02-26 DIAGNOSIS — G459 Transient cerebral ischemic attack, unspecified: Secondary | ICD-10-CM

## 2015-02-26 LAB — COMPREHENSIVE METABOLIC PANEL
ALT: 14 U/L — ABNORMAL LOW (ref 17–63)
AST: 10 U/L — ABNORMAL LOW (ref 15–41)
Albumin: 2.8 g/dL — ABNORMAL LOW (ref 3.5–5.0)
Alkaline Phosphatase: 48 U/L (ref 38–126)
Anion gap: 11 (ref 5–15)
BUN: 47 mg/dL — ABNORMAL HIGH (ref 6–20)
CALCIUM: 8.2 mg/dL — AB (ref 8.9–10.3)
CHLORIDE: 100 mmol/L — AB (ref 101–111)
CO2: 25 mmol/L (ref 22–32)
CREATININE: 6.81 mg/dL — AB (ref 0.61–1.24)
GFR calc non Af Amer: 8 mL/min — ABNORMAL LOW (ref >60)
GFR, EST AFRICAN AMERICAN: 9 mL/min — AB (ref >60)
Glucose, Bld: 390 mg/dL — ABNORMAL HIGH (ref 65–99)
Potassium: 3.8 mmol/L (ref 3.5–5.1)
Sodium: 136 mmol/L (ref 135–145)
Total Bilirubin: 0.7 mg/dL (ref 0.3–1.2)
Total Protein: 6.5 g/dL (ref 6.5–8.1)

## 2015-02-26 LAB — CBC
HCT: 39.1 % (ref 39.0–52.0)
HEMOGLOBIN: 13.3 g/dL (ref 13.0–17.0)
MCH: 29.8 pg (ref 26.0–34.0)
MCHC: 34 g/dL (ref 30.0–36.0)
MCV: 87.5 fL (ref 78.0–100.0)
Platelets: 128 10*3/uL — ABNORMAL LOW (ref 150–400)
RBC: 4.47 MIL/uL (ref 4.22–5.81)
RDW: 13.8 % (ref 11.5–15.5)
WBC: 6.9 10*3/uL (ref 4.0–10.5)

## 2015-02-26 LAB — LIPID PANEL
CHOL/HDL RATIO: 5.7 ratio
Cholesterol: 187 mg/dL (ref 0–200)
HDL: 33 mg/dL — AB (ref >40)
LDL Cholesterol: 109 mg/dL — ABNORMAL HIGH (ref 0–99)
Triglycerides: 226 mg/dL — ABNORMAL HIGH (ref <150)
VLDL: 45 mg/dL — ABNORMAL HIGH (ref 0–40)

## 2015-02-26 LAB — GLUCOSE, CAPILLARY
GLUCOSE-CAPILLARY: 393 mg/dL — AB (ref 65–99)
Glucose-Capillary: 162 mg/dL — ABNORMAL HIGH (ref 65–99)
Glucose-Capillary: 356 mg/dL — ABNORMAL HIGH (ref 65–99)

## 2015-02-26 MED ORDER — ATORVASTATIN CALCIUM 20 MG PO TABS: 20.0000 mg | ORAL_TABLET | Freq: Every day | ORAL | Status: DC

## 2015-02-26 MED ORDER — CLOPIDOGREL BISULFATE 75 MG PO TABS: 75.0000 mg | ORAL_TABLET | Freq: Every day | ORAL | Status: DC

## 2015-02-26 MED ORDER — SULINDAC 150 MG PO TABS
150.0000 mg | ORAL_TABLET | Freq: Two times a day (BID) | ORAL | Status: DC | PRN
  Administered 2015-02-26: 150 mg via ORAL
  Filled 2015-02-26 (×2): qty 1

## 2015-02-26 MED ORDER — AMLODIPINE BESYLATE 5 MG PO TABS
2.5000 mg | ORAL_TABLET | Freq: Every day | ORAL | Status: DC
  Administered 2015-02-26: 2.5 mg via ORAL
  Filled 2015-02-26: qty 1

## 2015-02-26 MED ORDER — CLONIDINE HCL 0.1 MG PO TABS
0.1000 mg | ORAL_TABLET | Freq: Once | ORAL | Status: AC
  Administered 2015-02-26: 0.1 mg via ORAL
  Filled 2015-02-26: qty 1

## 2015-02-26 MED ORDER — AMLODIPINE BESYLATE 2.5 MG PO TABS: 2.5000 mg | ORAL_TABLET | Freq: Every day | ORAL | Status: DC

## 2015-02-26 NOTE — Discharge Summary (Signed)
Physician Discharge Summary  Kenneth [name] QHU:[id number] DOB: 1951/01/22 DOA: 02/25/2015  PCP: Leonides Grills, MD  Admit date: 02/25/2015 Discharge date: 02/26/2015  Time spent: 40 minutes  Recommendations for Outpatient Follow-up:  1. PCP 1-2 weeks for evaluation of symptoms, monitoring of BP as anti-hypertensive started, follow A1c and Hg.    Discharge Diagnoses:  Principal Problem:   TIA (transient ischemic attack) Active Problems:   Diabetes mellitus with kidney disease   Hypertension   End stage renal disease   ESRD (end stage renal disease)   Discharge Condition: stable  Diet recommendation: carb modified heart healthy  Filed Weights   02/25/15 1616  Weight: 108.863 kg (240 lb)    History of present illness:  64 yo male with dm2, htn, ESRD on HD M, W, F, apparently had confusion and slurred speech, x 30 minutes, Starting somewhere between 12-1 pm on 02/25/15. Pt denied vision change, focal weakness, numbness, tingling. Pt was recently started on sulindac for arthritis but this hasn't helped much. Pt was brought to ED. CT brain negative for acute process.  Hospital Course:  TIA: symptoms quickly resolved. No events on tele. Patient refused MRI. Echo with EF 60% normal LV function. Carotid dopplar no significant stenosis. Lipid panel triglycerides 226 and HDL 33 and LDL 109. HgA1c pending at discharge. He was started on Plavix and statin. He will follow up with PCP to ensure optimal BP control and Diabetes control. SRD on HD on M, W, F: he was evaluated by nephrology who recommended dialysis. Patient refused saying "i will take care of it when i get discharged.  Dm2 A1c pending at discharge. Recommend close outpatient follow up, patient expressed understanding. Anemia: mild but quite a drop from last year. Likely related to chronic disease.    Procedures:  - Left ventricle: The cavity size was normal. Wall thickness wasnormal. Systolic function was normal. The  estimated ejectionfraction was in the range of 55% to 60%. Left ventriculardiastolic function parameters were normal.  Consultations:  none  Discharge Exam: Filed Vitals:   02/26/15 1400  BP: 176/75  Pulse: 63  Temp: 97.9 F (36.6 C)  Resp: 20    General: obese but appears comfortable very friendly Cardiovascular: RRR no MGR no LE edema Respiratory: normal effort BS clear bilaterally no wheeze Neuro: bilateral grip 5/5 gait steady, speech clear  Discharge Instructions    Current Discharge Medication List    START taking these medications   Details  amLODipine (NORVASC) 2.5 MG tablet Take 1 tablet (2.5 mg total) by mouth daily. Qty: 30 tablet, Refills: 1    atorvastatin (LIPITOR) 20 MG tablet Take 1 tablet (20 mg total) by mouth daily at 6 PM. Qty: 30 tablet, Refills: 1    clopidogrel (PLAVIX) 75 MG tablet Take 1 tablet (75 mg total) by mouth daily. Qty: 30 tablet, Refills: 1      CONTINUE these medications which have NOT CHANGED   Details  acetaminophen (TYLENOL) 500 MG tablet Take 1,000 mg by mouth every 8 (eight) hours as needed for mild pain or moderate pain.     calcium acetate (PHOSLO) 667 MG capsule Take by mouth 4 (four) times daily. To take 4 capsules 4 times daily with meals and 1 capsule with snacks    Insulin Detemir (LEVEMIR FLEXTOUCH) 100 UNIT/ML Pen Inject 60 Units into the skin every morning.    multivitamin (RENA-VIT) TABS tablet Take 1 tablet by mouth daily.    sulindac (CLINORIL) 200 MG tablet Take 200 mg  by mouth 2 (two) times daily as needed (for pain).  Refills: 0      STOP taking these medications     simvastatin (ZOCOR) 40 MG tablet        Allergies  Allergen Reactions  . Penicillins Other (See Comments)     Family history of allergies and hence he does not use this - states mom's MD told him never to take it.   Follow-up Information    Follow up with Leonides Grills, MD.   Specialty:  Family Medicine   Why:  1 week for  evaluation of symptoms   Contact information:   [address] [place] [address] [phone]        The results of significant diagnostics from this hospitalization (including imaging, microbiology, ancillary and laboratory) are listed below for reference.    Significant Diagnostic Studies: Dg Chest 2 View  02/25/2015   CLINICAL DATA:  Mid and right chest pain.  EXAM: CHEST - 2 VIEW  COMPARISON:  09/14/2013  FINDINGS: The heart size and mediastinal contours are within normal limits. Stable mild elevation of the right hemidiaphragm. There is no evidence of pulmonary edema, consolidation, pneumothorax, nodule or pleural fluid. Stable thoracic spondylosis.  IMPRESSION: No active disease.   Electronically Signed   By: Aletta Edouard M.D.   On: 02/25/2015 17:52   Dg Lumbar Spine Complete  02/25/2015   CLINICAL DATA:  Low back pain.  EXAM: LUMBAR SPINE - COMPLETE 4+ VIEW  COMPARISON:  February 11, 2013.  FINDINGS: No fracture is noted. Minimal grade 1 anterolisthesis is noted secondary to posterior facet joint hypertrophy. Anterior osteophyte formation is noted at L1-2, L2-3 and L3-4. Mild disc space narrowing is noted at L1-2 and L2-3.  IMPRESSION: Degenerative changes as described above. No acute abnormality seen in the lumbar spine.   Electronically Signed   By: Marijo Conception, M.D.   On: 02/25/2015 17:54   Ct Head Wo Contrast  02/25/2015   CLINICAL DATA:  64 year old male with peripheral confusion and slurred speech  EXAM: CT HEAD WITHOUT CONTRAST  TECHNIQUE: Contiguous axial images were obtained from the base of the skull through the vertex without intravenous contrast.  COMPARISON:  None.  FINDINGS: The ventricles and sulci are appropriate in size for the patient's age. Minimal periventricular and deep white matter areas of hypodensity is compatible with chronic skin changes. There is no intracranial hemorrhage. No mass effect or midline shift identified.There is no extra-axial fluid  collection.  The visualized paranasal sinuses and mastoid air cells are well aerated. The calvarium is intact.  IMPRESSION: No acute intracranial pathology.   Electronically Signed   By: Anner Crete M.D.   On: 02/25/2015 17:34   US Carotid Bilateral  02/26/2015   CLINICAL DATA:  Transient ischemic attack.  EXAM: BILATERAL CAROTID DUPLEX ULTRASOUND  TECHNIQUE: Pearline Cables scale imaging, color Doppler and duplex ultrasound were performed of bilateral carotid and vertebral arteries in the neck.  COMPARISON:  None.  FINDINGS: Criteria: Quantification of carotid stenosis is based on velocity parameters that correlate the residual internal carotid diameter with NASCET-based stenosis levels, using the diameter of the distal internal carotid lumen as the denominator for stenosis measurement.  The following velocity measurements were obtained:  RIGHT  ICA:  70/8 cm/sec  CCA:  093/2 cm/sec  SYSTOLIC ICA/CCA RATIO:  0.6  DIASTOLIC ICA/CCA RATIO:  0.9  ECA:  135 cm/sec  LEFT  ICA:  72/11 cm/sec  CCA:  355/73 cm/sec  SYSTOLIC ICA/CCA  RATIO:  0.5  DIASTOLIC ICA/CCA RATIO:  1.0  ECA:  110 cm/sec  RIGHT CAROTID ARTERY: No significant plaque or stenosis is noted.  RIGHT VERTEBRAL ARTERY:  Antegrade flow is noted.  LEFT CAROTID ARTERY:  No significant plaque or stenosis is noted.  LEFT VERTEBRAL ARTERY:  Antegrade flow is noted.  IMPRESSION: No hemodynamically significant stenosis or plaque is noted in either cervical carotid artery.   Electronically Signed   By: Marijo Conception, M.D.   On: 02/26/2015 12:15   Labs: Basic Metabolic Panel:  Recent Labs Lab 02/25/15 1712 02/26/15 0642  NA 136 136  K 3.6 3.8  CL 100* 100*  CO2 26 25  GLUCOSE 268* 390*  BUN 42* 47*  CREATININE 6.20* 6.81*  CALCIUM 8.3* 8.2*   Liver Function Tests:  Recent Labs Lab 02/26/15 0642  AST 10*  ALT 14*  ALKPHOS 48  BILITOT 0.7  PROT 6.5  ALBUMIN 2.8*   CBC:  Recent Labs Lab 02/25/15 1712 02/26/15 0642  WBC 6.2 6.9  NEUTROABS  3.9  --   HGB 12.7* 13.3  HCT 37.0* 39.1  MCV 86.4 87.5  PLT 112* 128*   CBG:  Recent Labs Lab 02/25/15 1620 02/25/15 2123 02/26/15 0735 02/26/15 1123  GLUCAP 247* 162* 356* 393*   Signed:  BLACK,KAREN M  [facility] 02/26/2015, 3:58 PM  Patient was seen, examined,treatment plan was discussed with the Advance Practice Provider.  I have directly reviewed the clinical findings, lab, imaging studies and management of this patient in detail. I have made the necessary changes to the above noted documentation, and agree with the documentation, as recorded by the Advance Practice Provider.    Marzetta Board, MD [facility] [phone]

## 2015-02-26 NOTE — Care Management Note (Signed)
Case Management Note  Patient Details  Name: Kenneth [name] MRN: [id number] Date of Birth: 1951-03-31  Subjective/Objective:                  Pt admitted from home with possible TIA/CVA. Pt lives with his mother and will return home at discharge. Pt is independent with ADL's. Pt receives dialysis at [facility] in [place].  Action/Plan: No Cm needs noted.  Expected Discharge Date:                  Expected Discharge Plan:  Home/Self Care  In-House Referral:  NA  Discharge planning Services  CM Consult  Post Acute Care Choice:  NA Choice offered to:  NA  DME Arranged:    DME Agency:     HH Arranged:    HH Agency:     Status of Service:  Completed, signed off  Medicare Important Message Given:    Date Medicare IM Given:    Medicare IM give by:    Date Additional Medicare IM Given:    Additional Medicare Important Message give by:     If discussed at [place] of Stay Meetings, dates discussed:    Additional Comments:  [name], RN 02/26/2015, 10:36 AM

## 2015-02-26 NOTE — Progress Notes (Signed)
Kenneth [name] discharged home with family per MD order.  Discharge instructions reviewed and discussed with the patient and family at bedside, all questions and concerns answered. Copy of instructions and scripts given to patient.    Medication List    STOP taking these medications        simvastatin 40 MG tablet  Commonly known as:  ZOCOR      TAKE these medications        acetaminophen 500 MG tablet  Commonly known as:  TYLENOL  Take 1,000 mg by mouth every 8 (eight) hours as needed for mild pain or moderate pain.     amLODipine 2.5 MG tablet  Commonly known as:  NORVASC  Take 1 tablet (2.5 mg total) by mouth daily.     atorvastatin 20 MG tablet  Commonly known as:  LIPITOR  Take 1 tablet (20 mg total) by mouth daily at 6 PM.     calcium acetate 667 MG capsule  Commonly known as:  PHOSLO  Take by mouth 4 (four) times daily. To take 4 capsules 4 times daily with meals and 1 capsule with snacks     clopidogrel 75 MG tablet  Commonly known as:  PLAVIX  Take 1 tablet (75 mg total) by mouth daily.     LEVEMIR FLEXTOUCH 100 UNIT/ML Pen  Generic drug:  Insulin Detemir  Inject 60 Units into the skin every morning.     multivitamin Tabs tablet  Take 1 tablet by mouth daily.     sulindac 200 MG tablet  Commonly known as:  CLINORIL  Take 200 mg by mouth 2 (two) times daily as needed (for pain).        Patients skin is clean, dry and intact, no evidence of skin break down. IV site discontinued and catheter remains intact. Site without signs and symptoms of complications. Dressing and pressure applied.  Patient escorted to car by Tye Maryland, NT in a wheelchair,  no distress noted upon discharge.  Regino Bellow 02/26/2015 5:45 PM

## 2015-02-26 NOTE — Consult Note (Signed)
Reason for Consult: End-stage renal disease Referring Physician: Dr. Cresenciano Genre Kenneth [name] is an 64 y.o. male.  HPI: He is a patient with history of diabetes, hypertension, end-stage renal disease on hemodialysis Monday Wednesday Friday presently came with complaints of confusion, slurred vision starting last night. Presently patient feels much better. He denies any weakness, dizziness or lightheadedness. Patient however complains of back pain with some radiation to his left leg and also foot for the last couple of weeks. He denies any difficulty in breathing and his last dialysis was on Monday.  Past Medical History  Diagnosis Date  . Diabetes mellitus without complication   . Renal disorder   . Blood disease     states that he makes too many red blood cells.  . Hypertension     history of, off medication now  . Hyperlipidemia   . GERD (gastroesophageal reflux disease)     history  . Arthritis   . Carpal tunnel syndrome   . ESRD (end stage renal disease)     on Dialysis M, W, F    Past Surgical History  Procedure Laterality Date  . Hernia repair  1995    umbiccal  . Hand surgery Right     tendon and muscle repair after injury  . Av fistula placement Left 09/14/2013    Procedure: ARTERIOVENOUS (AV) [place];  Surgeon: Rosetta Posner, MD;  Location: [facility] OR;  Service: Vascular;  Laterality: Left;    Family History  Problem Relation Age of Onset  . Cancer Father   . Diabetes Father   . Heart disease Father     Heart Disease before age 25  . Heart attack Father   . Hypertension Daughter   . Hyperlipidemia Son   . Glaucoma Mother     Social History:  reports that he has never smoked. He has never used smokeless tobacco. He reports that he does not drink alcohol or use illicit drugs.  Allergies:  Allergies  Allergen Reactions  . Penicillins Other (See Comments)     Family history of allergies and hence he does not use this - states mom's MD told him never  to take it.    Medications: I have reviewed the patient's current medications.  Results for orders placed or performed during the hospital encounter of 02/25/15 (from the past 48 hour(s))  CBG monitoring, ED     Status: Abnormal   Collection Time: 02/25/15  4:20 PM  Result Value Ref Range   Glucose-Capillary 247 (H) 65 - 99 mg/dL  CBC with Differential     Status: Abnormal   Collection Time: 02/25/15  5:12 PM  Result Value Ref Range   WBC 6.2 4.0 - 10.5 K/uL   RBC 4.28 4.22 - 5.81 MIL/uL   Hemoglobin 12.7 (L) 13.0 - 17.0 g/dL   HCT 37.0 (L) 39.0 - 52.0 %   MCV 86.4 78.0 - 100.0 fL   MCH 29.7 26.0 - 34.0 pg   MCHC 34.3 30.0 - 36.0 g/dL   RDW 13.8 11.5 - 15.5 %   Platelets 112 (L) 150 - 400 K/uL    Comment: SPECIMEN CHECKED FOR CLOTS PLATELET COUNT CONFIRMED BY SMEAR    Neutrophils Relative % 62 43 - 77 %   Neutro Abs 3.9 1.7 - 7.7 K/uL   Lymphocytes Relative 27 12 - 46 %   Lymphs Abs 1.7 0.7 - 4.0 K/uL   Monocytes Relative 7 3 - 12 %   Monocytes  Absolute 0.4 0.1 - 1.0 K/uL   Eosinophils Relative 3 0 - 5 %   Eosinophils Absolute 0.2 0.0 - 0.7 K/uL   Basophils Relative 1 0 - 1 %   Basophils Absolute 0.0 0.0 - 0.1 K/uL  Basic metabolic panel     Status: Abnormal   Collection Time: 02/25/15  5:12 PM  Result Value Ref Range   Sodium 136 135 - 145 mmol/L   Potassium 3.6 3.5 - 5.1 mmol/L   Chloride 100 (L) 101 - 111 mmol/L   CO2 26 22 - 32 mmol/L   Glucose, Bld 268 (H) 65 - 99 mg/dL   BUN 42 (H) 6 - 20 mg/dL   Creatinine, Ser 6.20 (H) 0.61 - 1.24 mg/dL   Calcium 8.3 (L) 8.9 - 10.3 mg/dL   GFR calc non Af Amer 9 (L) >60 mL/min   GFR calc Af Amer 10 (L) >60 mL/min    Comment: (NOTE) The eGFR has been calculated using the CKD EPI equation. This calculation has not been validated in all clinical situations. eGFR's persistently <60 mL/min signify possible Chronic Kidney Disease.    Anion gap 10 5 - 15  Urinalysis, Routine w reflex microscopic (not at [facility])     Status:  Abnormal   Collection Time: 02/25/15  6:31 PM  Result Value Ref Range   Color, Urine YELLOW YELLOW   APPearance CLEAR CLEAR   Specific Gravity, Urine 1.020 1.005 - 1.030   pH 7.5 5.0 - 8.0   Glucose, UA 500 (A) NEGATIVE mg/dL   Hgb urine dipstick MODERATE (A) NEGATIVE   Bilirubin Urine NEGATIVE NEGATIVE   Ketones, ur NEGATIVE NEGATIVE mg/dL   Protein, ur >300 (A) NEGATIVE mg/dL   Urobilinogen, UA 0.2 0.0 - 1.0 mg/dL   Nitrite NEGATIVE NEGATIVE   Leukocytes, UA NEGATIVE NEGATIVE  Urine microscopic-add on     Status: None   Collection Time: 02/25/15  6:31 PM  Result Value Ref Range   Squamous Epithelial / LPF RARE RARE   WBC, UA 3-6 <3 WBC/hpf   RBC / HPF 7-10 <3 RBC/hpf   Bacteria, UA RARE RARE  Glucose, capillary     Status: Abnormal   Collection Time: 02/25/15  9:23 PM  Result Value Ref Range   Glucose-Capillary 162 (H) 65 - 99 mg/dL   Comment 1 Notify RN    Comment 2 Document in Chart   Comprehensive metabolic panel     Status: Abnormal   Collection Time: 02/26/15  6:42 AM  Result Value Ref Range   Sodium 136 135 - 145 mmol/L   Potassium 3.8 3.5 - 5.1 mmol/L   Chloride 100 (L) 101 - 111 mmol/L   CO2 25 22 - 32 mmol/L   Glucose, Bld 390 (H) 65 - 99 mg/dL   BUN 47 (H) 6 - 20 mg/dL   Creatinine, Ser 6.81 (H) 0.61 - 1.24 mg/dL   Calcium 8.2 (L) 8.9 - 10.3 mg/dL   Total Protein 6.5 6.5 - 8.1 g/dL   Albumin 2.8 (L) 3.5 - 5.0 g/dL   AST 10 (L) 15 - 41 U/L   ALT 14 (L) 17 - 63 U/L   Alkaline Phosphatase 48 38 - 126 U/L   Total Bilirubin 0.7 0.3 - 1.2 mg/dL   GFR calc non Af Amer 8 (L) >60 mL/min   GFR calc Af Amer 9 (L) >60 mL/min    Comment: (NOTE) The eGFR has been calculated using the CKD EPI equation. This calculation has not been  validated in all clinical situations. eGFR's persistently <60 mL/min signify possible Chronic Kidney Disease.    Anion gap 11 5 - 15  CBC     Status: Abnormal   Collection Time: 02/26/15  6:42 AM  Result Value Ref Range   WBC 6.9 4.0 -  10.5 K/uL   RBC 4.47 4.22 - 5.81 MIL/uL   Hemoglobin 13.3 13.0 - 17.0 g/dL   HCT 39.1 39.0 - 52.0 %   MCV 87.5 78.0 - 100.0 fL   MCH 29.8 26.0 - 34.0 pg   MCHC 34.0 30.0 - 36.0 g/dL   RDW 13.8 11.5 - 15.5 %   Platelets 128 (L) 150 - 400 K/uL  Glucose, capillary     Status: Abnormal   Collection Time: 02/26/15  7:35 AM  Result Value Ref Range   Glucose-Capillary 356 (H) 65 - 99 mg/dL    Dg Chest 2 View  02/25/2015   CLINICAL DATA:  Mid and right chest pain.  EXAM: CHEST - 2 VIEW  COMPARISON:  09/14/2013  FINDINGS: The heart size and mediastinal contours are within normal limits. Stable mild elevation of the right hemidiaphragm. There is no evidence of pulmonary edema, consolidation, pneumothorax, nodule or pleural fluid. Stable thoracic spondylosis.  IMPRESSION: No active disease.   Electronically Signed   By: Aletta Edouard M.D.   On: 02/25/2015 17:52   Dg Lumbar Spine Complete  02/25/2015   CLINICAL DATA:  Low back pain.  EXAM: LUMBAR SPINE - COMPLETE 4+ VIEW  COMPARISON:  February 11, 2013.  FINDINGS: No fracture is noted. Minimal grade 1 anterolisthesis is noted secondary to posterior facet joint hypertrophy. Anterior osteophyte formation is noted at L1-2, L2-3 and L3-4. Mild disc space narrowing is noted at L1-2 and L2-3.  IMPRESSION: Degenerative changes as described above. No acute abnormality seen in the lumbar spine.   Electronically Signed   By: Marijo Conception, M.D.   On: 02/25/2015 17:54   Ct Head Wo Contrast  02/25/2015   CLINICAL DATA:  64 year old male with peripheral confusion and slurred speech  EXAM: CT HEAD WITHOUT CONTRAST  TECHNIQUE: Contiguous axial images were obtained from the base of the skull through the vertex without intravenous contrast.  COMPARISON:  None.  FINDINGS: The ventricles and sulci are appropriate in size for the patient's age. Minimal periventricular and deep white matter areas of hypodensity is compatible with chronic skin changes. There is no intracranial  hemorrhage. No mass effect or midline shift identified.There is no extra-axial fluid collection.  The visualized paranasal sinuses and mastoid air cells are well aerated. The calvarium is intact.  IMPRESSION: No acute intracranial pathology.   Electronically Signed   By: Anner Crete M.D.   On: 02/25/2015 17:34    Review of Systems  Gastrointestinal: Negative for nausea and vomiting.  Musculoskeletal: Positive for back pain.  Neurological: Positive for speech change. Negative for dizziness and focal weakness.   Blood pressure 177/79, pulse 66, temperature 97.8 F (36.6 C), temperature source Oral, resp. rate 20, height _0  (1.727 m), weight 240 lb (108.863 kg), SpO2 97 %. Physical Exam  Constitutional: No distress.  Eyes: No scleral icterus.  Neck: No JVD present.  Cardiovascular: Normal rate and regular rhythm.   Respiratory: No respiratory distress. He has no wheezes.  GI: There is no tenderness.  Musculoskeletal: He exhibits edema.    Assessment/Plan: Problem #1 history of confusion and slurred speech possibly TIA. Presently he is asymptomatic. Problem #2 end-stage renal disease: He is  status post hemodialysis on Monday. Presently patient doesn't have any uremic signs symptoms. His potassium is normal Problem #3 hypertension: His blood pressure is reasonably controlled Problem #4 diabetes Problem #5 metabolic bone disease: His calcium is range Problem #6 anemia: His hemoglobin is above our target goal Problem #7 back pain Problem #8 morbid obesity Plan: We'll make arrangements for patient to get dialysis today We'll hold Epogen Will remove about 2-1/2 L if his blood pressure tolerates. We'll dialyze him for 4 hours.  Yurem Viner S 02/26/2015, 7:52 AM

## 2015-02-26 NOTE — Progress Notes (Addendum)
PT . BP 194/81 with a HR of 70. Dr.Kim gave orders to administer Clonidine 0.1 mg PO times one. Will administer and continue to monitor

## 2015-02-27 LAB — HEMOGLOBIN A1C
HEMOGLOBIN A1C: 11.1 % — AB (ref 4.8–5.6)
Mean Plasma Glucose: 272 mg/dL

## 2015-03-17 DIAGNOSIS — G459 Transient cerebral ischemic attack, unspecified: Secondary | ICD-10-CM

## 2015-03-17 HISTORY — DX: Transient cerebral ischemic attack, unspecified: G45.9

## 2015-03-21 ENCOUNTER — Emergency Department (HOSPITAL_COMMUNITY): Payer: BLUE CROSS/BLUE SHIELD

## 2015-03-21 ENCOUNTER — Encounter (HOSPITAL_COMMUNITY): Payer: Self-pay | Admitting: *Deleted

## 2015-03-21 ENCOUNTER — Emergency Department (HOSPITAL_COMMUNITY)
Admission: EM | Admit: 2015-03-21 | Discharge: 2015-03-21 | Discharge status: Against Medical Advice | Payer: BLUE CROSS/BLUE SHIELD | Attending: Emergency Medicine | Admitting: Emergency Medicine

## 2015-03-21 DIAGNOSIS — E785 Hyperlipidemia, unspecified: Secondary | ICD-10-CM | POA: Diagnosis not present

## 2015-03-21 DIAGNOSIS — R079 Chest pain, unspecified: Secondary | ICD-10-CM | POA: Insufficient documentation

## 2015-03-21 DIAGNOSIS — Z7902 Long term (current) use of antithrombotics/antiplatelets: Secondary | ICD-10-CM | POA: Diagnosis not present

## 2015-03-21 DIAGNOSIS — E119 Type 2 diabetes mellitus without complications: Secondary | ICD-10-CM | POA: Insufficient documentation

## 2015-03-21 DIAGNOSIS — I120 Hypertensive chronic kidney disease with stage 5 chronic kidney disease or end stage renal disease: Secondary | ICD-10-CM | POA: Diagnosis not present

## 2015-03-21 DIAGNOSIS — R4781 Slurred speech: Secondary | ICD-10-CM | POA: Diagnosis not present

## 2015-03-21 DIAGNOSIS — K219 Gastro-esophageal reflux disease without esophagitis: Secondary | ICD-10-CM | POA: Diagnosis not present

## 2015-03-21 DIAGNOSIS — Z992 Dependence on renal dialysis: Secondary | ICD-10-CM | POA: Insufficient documentation

## 2015-03-21 DIAGNOSIS — N186 End stage renal disease: Secondary | ICD-10-CM | POA: Diagnosis not present

## 2015-03-21 DIAGNOSIS — Z794 Long term (current) use of insulin: Secondary | ICD-10-CM | POA: Diagnosis not present

## 2015-03-21 DIAGNOSIS — Z880 Allergy status to penicillin: Secondary | ICD-10-CM | POA: Diagnosis not present

## 2015-03-21 DIAGNOSIS — R531 Weakness: Secondary | ICD-10-CM | POA: Diagnosis not present

## 2015-03-21 DIAGNOSIS — Z79899 Other long term (current) drug therapy: Secondary | ICD-10-CM | POA: Diagnosis not present

## 2015-03-21 DIAGNOSIS — R443 Hallucinations, unspecified: Secondary | ICD-10-CM | POA: Insufficient documentation

## 2015-03-21 DIAGNOSIS — M1990 Unspecified osteoarthritis, unspecified site: Secondary | ICD-10-CM | POA: Insufficient documentation

## 2015-03-21 LAB — URINALYSIS, ROUTINE W REFLEX MICROSCOPIC
BILIRUBIN URINE: NEGATIVE
GLUCOSE, UA: 500 mg/dL — AB
KETONES UR: 15 mg/dL — AB
Leukocytes, UA: NEGATIVE
NITRITE: NEGATIVE
Protein, ur: >300 mg/dL — AB
SPECIFIC GRAVITY, URINE: 1.018 (ref 1.005–1.030)
UROBILINOGEN UA: 0.2 mg/dL (ref 0.0–1.0)
pH: 7.5 (ref 5.0–8.0)

## 2015-03-21 LAB — RAPID URINE DRUG SCREEN, HOSP PERFORMED
AMPHETAMINES: NOT DETECTED
BARBITURATES: NOT DETECTED
BENZODIAZEPINES: NOT DETECTED
Cocaine: NOT DETECTED
Opiates: NOT DETECTED
Tetrahydrocannabinol: NOT DETECTED

## 2015-03-21 LAB — DIFFERENTIAL
BASOS ABS: 0 10*3/uL (ref 0.0–0.1)
Basophils Relative: 1 % (ref 0–1)
Eosinophils Absolute: 0.2 10*3/uL (ref 0.0–0.7)
Eosinophils Relative: 2 % (ref 0–5)
LYMPHS ABS: 1.5 10*3/uL (ref 0.7–4.0)
Lymphocytes Relative: 21 % (ref 12–46)
Monocytes Absolute: 0.4 10*3/uL (ref 0.1–1.0)
Monocytes Relative: 6 % (ref 3–12)
NEUTROS ABS: 4.8 10*3/uL (ref 1.7–7.7)
Neutrophils Relative %: 70 % (ref 43–77)

## 2015-03-21 LAB — COMPREHENSIVE METABOLIC PANEL
ALT: 16 U/L — ABNORMAL LOW (ref 17–63)
ANION GAP: 10 (ref 5–15)
AST: 14 U/L — ABNORMAL LOW (ref 15–41)
Albumin: 2.7 g/dL — ABNORMAL LOW (ref 3.5–5.0)
Alkaline Phosphatase: 46 U/L (ref 38–126)
BUN: 48 mg/dL — AB (ref 6–20)
CALCIUM: 9 mg/dL (ref 8.9–10.3)
CHLORIDE: 104 mmol/L (ref 101–111)
CO2: 23 mmol/L (ref 22–32)
Creatinine, Ser: 6.86 mg/dL — ABNORMAL HIGH (ref 0.61–1.24)
GFR calc Af Amer: 9 mL/min — ABNORMAL LOW (ref >60)
GFR, EST NON AFRICAN AMERICAN: 8 mL/min — AB (ref >60)
Glucose, Bld: 261 mg/dL — ABNORMAL HIGH (ref 65–99)
POTASSIUM: 4.8 mmol/L (ref 3.5–5.1)
Sodium: 137 mmol/L (ref 135–145)
Total Bilirubin: 0.6 mg/dL (ref 0.3–1.2)
Total Protein: 5.7 g/dL — ABNORMAL LOW (ref 6.5–8.1)

## 2015-03-21 LAB — I-STAT CHEM 8, ED
BUN: 47 mg/dL — AB (ref 6–20)
CALCIUM ION: 1.22 mmol/L (ref 1.13–1.30)
Chloride: 104 mmol/L (ref 101–111)
Creatinine, Ser: 6.9 mg/dL — ABNORMAL HIGH (ref 0.61–1.24)
Glucose, Bld: 190 mg/dL — ABNORMAL HIGH (ref 65–99)
HCT: 39 % (ref 39.0–52.0)
HEMOGLOBIN: 13.3 g/dL (ref 13.0–17.0)
Potassium: 4.2 mmol/L (ref 3.5–5.1)
SODIUM: 138 mmol/L (ref 135–145)
TCO2: 23 mmol/L (ref 0–100)

## 2015-03-21 LAB — CBC
HEMATOCRIT: 37.4 % — AB (ref 39.0–52.0)
Hemoglobin: 12.9 g/dL — ABNORMAL LOW (ref 13.0–17.0)
MCH: 30.1 pg (ref 26.0–34.0)
MCHC: 34.5 g/dL (ref 30.0–36.0)
MCV: 87.4 fL (ref 78.0–100.0)
PLATELETS: 134 10*3/uL — AB (ref 150–400)
RBC: 4.28 MIL/uL (ref 4.22–5.81)
RDW: 13.6 % (ref 11.5–15.5)
WBC: 6.9 10*3/uL (ref 4.0–10.5)

## 2015-03-21 LAB — I-STAT TROPONIN, ED
TROPONIN I, POC: 0 ng/mL (ref 0.00–0.08)
TROPONIN I, POC: 0.02 ng/mL (ref 0.00–0.08)

## 2015-03-21 LAB — SALICYLATE LEVEL

## 2015-03-21 LAB — PROTIME-INR
INR: 1 (ref 0.00–1.49)
Prothrombin Time: 13.4 seconds (ref 11.6–15.2)

## 2015-03-21 LAB — URINE MICROSCOPIC-ADD ON

## 2015-03-21 LAB — ETHANOL

## 2015-03-21 LAB — APTT: aPTT: 26 seconds (ref 24–37)

## 2015-03-21 LAB — ACETAMINOPHEN LEVEL: Acetaminophen (Tylenol), Serum: <10 ug/mL — ABNORMAL LOW (ref 10–30)

## 2015-03-21 MED ORDER — LORAZEPAM 2 MG/ML IJ SOLN: 1.5000 mg | Freq: Once | INTRAMUSCULAR | Status: DC

## 2015-03-21 NOTE — ED Notes (Signed)
MRI here to take patient.  Patient requests to talk with the MD.  MD aware.

## 2015-03-21 NOTE — Discharge Instructions (Signed)
Transient Ischemic Attack You are leaving AGAINST MEDICAL ADVICE. MRI has not been performed. You still could've had a stroke. Follow-up with the neurologist. Return to the ED if you develop new or worsening symptoms. A transient ischemic attack (TIA) is a "warning stroke" that causes stroke-like symptoms. Unlike a stroke, a TIA does not cause permanent damage to the brain. The symptoms of a TIA can happen very fast and do not last long. It is important to know the symptoms of a TIA and what to do. This can help prevent a major stroke or death. CAUSES   A TIA is caused by a temporary blockage in an artery in the brain or neck (carotid artery). The blockage does not allow the brain to get the blood supply it needs and can cause different symptoms. The blockage can be caused by either:  A blood clot.  Fatty buildup (plaque) in a neck or brain artery. RISK FACTORS  High blood pressure (hypertension).  High cholesterol.  Diabetes mellitus.  Heart disease.  The build up of plaque in the blood vessels (peripheral artery disease or atherosclerosis).  The build up of plaque in the blood vessels providing blood and oxygen to the brain (carotid artery stenosis).  An abnormal heart rhythm (atrial fibrillation).  Obesity.  Smoking.  Taking oral contraceptives (especially in combination with smoking).  Physical inactivity.  A diet high in fats, salt (sodium), and calories.  Alcohol use.  Use of illegal drugs (especially cocaine and methamphetamine).  Being male.  Being African American.  Being over the age of 72.  Family history of stroke.  Previous history of blood clots, stroke, TIA, or heart attack.  Sickle cell disease. SYMPTOMS  TIA symptoms are the same as a stroke but are temporary. These symptoms usually develop suddenly, or may be newly present upon awakening from sleep:  Sudden weakness or numbness of the face, arm, or leg, especially on one side of the  body.  Sudden trouble walking or difficulty moving arms or legs.  Sudden confusion.  Sudden personality changes.  Trouble speaking (aphasia) or understanding.  Difficulty swallowing.  Sudden trouble seeing in one or both eyes.  Double vision.  Dizziness.  Loss of balance or coordination.  Sudden severe headache with no known cause.  Trouble reading or writing.  Loss of bowel or bladder control.  Loss of consciousness. DIAGNOSIS  Your caregiver may be able to determine the presence or absence of a TIA based on your symptoms, history, and physical exam. Computed tomography (CT scan) of the brain is usually performed to help identify a TIA. Other tests may be done to diagnose a TIA. These tests may include:  Electrocardiography.  Continuous heart monitoring.  Echocardiography.  Carotid ultrasonography.  Magnetic resonance imaging (MRI).  A scan of the brain circulation.  Blood tests. PREVENTION  The risk of a TIA can be decreased by appropriately treating high blood pressure, high cholesterol, diabetes, heart disease, and obesity and by quitting smoking, limiting alcohol, and staying physically active. TREATMENT  Time is of the essence. Since the symptoms of TIA are the same as a stroke, it is important to seek treatment as soon as possible because you may need a medicine to dissolve the clot (thrombolytic) that cannot be given if too much time has passed. Treatment options vary. Treatment options may include rest, oxygen, intravenous (IV) fluids, and medicines to thin the blood (anticoagulants). Medicines and diet may be used to address diabetes, high blood pressure, and other risk factors.  Measures will be taken to prevent short-term and long-term complications, including infection from breathing foreign material into the lungs (aspiration pneumonia), blood clots in the legs, and falls. Treatment options include procedures to either remove plaque in the carotid arteries  or dilate carotid arteries that have narrowed due to plaque. Those procedures are:  Carotid endarterectomy.  Carotid angioplasty and stenting. HOME CARE INSTRUCTIONS   Take all medicines prescribed by your caregiver. Follow the directions carefully. Medicines may be used to control risk factors for a stroke. Be sure you understand all your medicine instructions.  You may be told to take aspirin or the anticoagulant warfarin. Warfarin needs to be taken exactly as instructed.  Taking too much or too little warfarin is dangerous. Too much warfarin increases the risk of bleeding. Too little warfarin continues to allow the risk for blood clots. While taking warfarin, you will need to have regular blood tests to measure your blood clotting time. A PT blood test measures how long it takes for blood to clot. Your PT is used to calculate another value called an INR. Your PT and INR help your caregiver to adjust your dose of warfarin. The dose can change for many reasons. It is critically important that you take warfarin exactly as prescribed.  Many foods, especially foods high in vitamin K can interfere with warfarin and affect the PT and INR. Foods high in vitamin K include spinach, kale, broccoli, cabbage, collard and turnip greens, brussels sprouts, peas, cauliflower, seaweed, and parsley as well as beef and pork liver, green tea, and soybean oil. You should eat a consistent amount of foods high in vitamin K. Avoid major changes in your diet, or notify your caregiver before changing your diet. Arrange a visit with a dietitian to answer your questions.  Many medicines can interfere with warfarin and affect the PT and INR. You must tell your caregiver about any and all medicines you take, this includes all vitamins and supplements. Be especially cautious with aspirin and anti-inflammatory medicines. Do not take or discontinue any prescribed or over-the-counter medicine except on the advice of your caregiver  or pharmacist.  Warfarin can have side effects, such as excessive bruising or bleeding. You will need to hold pressure over cuts for longer than usual. Your caregiver or pharmacist will discuss other potential side effects.  Avoid sports or activities that may cause injury or bleeding.  Be mindful when shaving, flossing your teeth, or handling sharp objects.  Alcohol can change the body's ability to handle warfarin. It is best to avoid alcoholic drinks or consume only very small amounts while taking warfarin. Notify your caregiver if you change your alcohol intake.  Notify your dentist or other caregivers before procedures.  Eat a diet that includes 5 or more servings of fruits and vegetables each day. This may reduce the risk of stroke. Certain diets may be prescribed to address high blood pressure, high cholesterol, diabetes, or obesity.  A low-sodium, low-saturated fat, low-trans fat, low-cholesterol diet is recommended to manage high blood pressure.  A low-saturated fat, low-trans fat, low-cholesterol, and high-fiber diet may control cholesterol levels.  A controlled-carbohydrate, controlled-sugar diet is recommended to manage diabetes.  A reduced-calorie, low-sodium, low-saturated fat, low-trans fat, low-cholesterol diet is recommended to manage obesity.  Maintain a healthy weight.  Stay physically active. It is recommended that you get at least 30 minutes of activity on most or all days.  Do not smoke.  Limit alcohol use even if you are not taking warfarin.  Moderate alcohol use is considered to be:  No more than 2 drinks each day for men.  No more than 1 drink each day for nonpregnant women.  Stop drug abuse.  Home safety. A safe home environment is important to reduce the risk of falls. Your caregiver may arrange for specialists to evaluate your home. Having grab bars in the bedroom and bathroom is often important. Your caregiver may arrange for equipment to be used at  home, such as raised toilets and a seat for the shower.  Follow all instructions for follow-up with your caregiver. This is very important. This includes any referrals and lab tests. Proper follow up can prevent a stroke or another TIA from occurring. SEEK MEDICAL CARE IF:  You have personality changes.  You have difficulty swallowing.  You are seeing double.  You have dizziness.  You have a fever.  You have skin breakdown. SEEK IMMEDIATE MEDICAL CARE IF:  Any of these symptoms may represent a serious problem that is an emergency. Do not wait to see if the symptoms will go away. Get medical help right away. Call your local emergency services (911 in [place] Do not drive yourself to the hospital.  You have sudden weakness or numbness of the face, arm, or leg, especially on one side of the body.  You have sudden trouble walking or difficulty moving arms or legs.  You have sudden confusion.  You have trouble speaking (aphasia) or understanding.  You have sudden trouble seeing in one or both eyes.  You have a loss of balance or coordination.  You have a sudden, severe headache with no known cause.  You have new chest pain or an irregular heartbeat.  You have a partial or total loss of consciousness. MAKE SURE YOU:   Understand these instructions.  Will watch your condition.  Will get help right away if you are not doing well or get worse. Document Released: 05/12/2005 Document Revised: 08/07/2013 Document Reviewed: 11/07/2013 [facility] Patient Information 2015 [place], [place]. This information is not intended to replace advice given to you by your health care provider. Make sure you discuss any questions you have with your health care provider.

## 2015-03-21 NOTE — ED Notes (Signed)
IV removed from right [facility], cath intact.  Dr Wyvonnia Dusky talked with patient at length about leaving against medical advice.  Patient states he understands that he could be causing more harm but he is "frustrated".  States he wants to go home, has back trouble and his back is "killing him" due to the stretcher and he is hungry.  Instructed that we could not feed him until after his MRI and swallow screen.  Patient stated he is not mad at anyone and does not want [place] to be mad at him.  Refused to use a wheel chair to discharge, patient steady gait.  This nurse unable to sign on the AMA form.  Understands he needs to follow up with neurology.

## 2015-03-21 NOTE — ED Notes (Signed)
Patient transported to CT 

## 2015-03-21 NOTE — ED Provider Notes (Signed)
CSN: [id number]     Arrival date & time 03/21/15  1259 History   First MD Initiated Contact with Patient 03/21/15 1557     Chief Complaint  Patient presents with  . Hallucinations     (Consider location/radiation/quality/duration/timing/severity/associated sxs/prior Treatment) HPI Comments: Patient complains of "hallucinations" onset last night. They're only present when he closes his eyes. He cannot tell me what they are whether they are objects, shadows or people. He denies hearing any voices. He is concerned this is a side effect of medication he's been taking for shingles which he does not know the name of. Patient admitted to [facility] 3 weeks ago for garbled speech for 30 minutes and worked up for TIA. He refused MRI at the time. Patient states he has slurred speech again this morning which is now improved. No focal weakness, numbness or tingling. No bowel or bladder incontinence. He's had right sided chest pain constantly for the past month and is worse with palpation. He was treated empirically for shingles but there was no rash. Denies shortness of breath, cough or fever. States compliance with dialysis regimen though he did not go today.  The history is provided by the patient and a relative.    Past Medical History  Diagnosis Date  . Diabetes mellitus without complication   . Renal disorder   . Blood disease     states that he makes too many red blood cells.  . Hypertension     history of, off medication now  . Hyperlipidemia   . GERD (gastroesophageal reflux disease)     history  . Arthritis   . Carpal tunnel syndrome   . ESRD (end stage renal disease)     on Dialysis M, W, F   Past Surgical History  Procedure Laterality Date  . Hernia repair  1995    umbiccal  . Hand surgery Right     tendon and muscle repair after injury  . Av fistula placement Left 09/14/2013    Procedure: ARTERIOVENOUS (AV) Heil;  Surgeon: Rosetta Posner, MD;  Location:  [facility] OR;  Service: Vascular;  Laterality: Left;   Family History  Problem Relation Age of Onset  . Cancer Father   . Diabetes Father   . Heart disease Father     Heart Disease before age 74  . Heart attack Father   . Hypertension Daughter   . Hyperlipidemia Son   . Glaucoma Mother    History  Substance Use Topics  . Smoking status: Never Smoker   . Smokeless tobacco: Never Used  . Alcohol Use: No    Review of Systems  Constitutional: Negative for fever, activity change, appetite change and fatigue.  HENT: Negative for congestion, nosebleeds and rhinorrhea.   Eyes: Negative for visual disturbance.  Respiratory: Negative for cough and shortness of breath.   Cardiovascular: Positive for chest pain.  Gastrointestinal: Negative for nausea, vomiting and abdominal pain.  Genitourinary: Negative for dysuria, hematuria and testicular pain.  Musculoskeletal: Negative for myalgias and arthralgias.  Skin: Negative for rash.  Neurological: Positive for speech difficulty and weakness. Negative for dizziness, light-headedness, numbness and headaches.  A complete 10 system review of systems was obtained and all systems are negative except as noted in the HPI and PMH.      Allergies  Penicillins  Home Medications   Prior to Admission medications   Medication Sig Start Date End Date Taking? Authorizing Provider  acetaminophen (TYLENOL) 500 MG tablet Take  1,000 mg by mouth every 8 (eight) hours as needed for mild pain or moderate pain.    Yes Historical Provider, MD  amLODipine (NORVASC) 2.5 MG tablet Take 1 tablet (2.5 mg total) by mouth daily. 02/26/15  Yes Radene Gunning, NP  clopidogrel (PLAVIX) 75 MG tablet Take 1 tablet (75 mg total) by mouth daily. 02/26/15  Yes Lezlie Octave Black, NP  ibuprofen (ADVIL,MOTRIN) 200 MG tablet Take 200 mg by mouth every 6 (six) hours as needed for headache, mild pain or moderate pain.   Yes Historical Provider, MD  Insulin Detemir (LEVEMIR FLEXTOUCH) 100  UNIT/ML Pen Inject 60 Units into the skin every morning.   Yes Historical Provider, MD  lisinopril (PRINIVIL,ZESTRIL) 20 MG tablet Take 20 mg by mouth daily at 12 noon. 03/07/15  Yes Historical Provider, MD  multivitamin (RENA-VIT) TABS tablet Take 1 tablet by mouth daily.   Yes Historical Provider, MD  simvastatin (ZOCOR) 40 MG tablet Take 40 mg by mouth daily at 6 PM.   Yes Historical Provider, MD  atorvastatin (LIPITOR) 20 MG tablet Take 1 tablet (20 mg total) by mouth daily at 6 PM. Patient not taking: Reported on 03/21/2015 02/26/15   Lezlie Octave Black, NP   BP 155/62 mmHg  Pulse 63  Temp(Src) 98 F (36.7 C) (Oral)  Resp 15  Ht 5\' 8"  (1.727 m)  Wt 240 lb (108.863 kg)  BMI 36.50 kg/m2  SpO2 97% Physical Exam  Constitutional: He is oriented to person, place, and time. He appears well-developed and well-nourished. No distress.  HENT:  Head: Normocephalic and atraumatic.  Mouth/Throat: Oropharynx is clear and moist. No oropharyngeal exudate.  Eyes: Conjunctivae and EOM are normal. Pupils are equal, round, and reactive to light.  Neck: Normal range of motion. Neck supple.  No meningismus.  Cardiovascular: Normal rate, regular rhythm, normal heart sounds and intact distal pulses.   No murmur heard. Pulmonary/Chest: Effort normal and breath sounds normal. No respiratory distress. He exhibits tenderness.  Right chest wall tenderness, no rash  Abdominal: Soft. There is no tenderness. There is no rebound and no guarding.  Musculoskeletal: Normal range of motion. He exhibits no edema or tenderness.  Neurological: He is alert and oriented to person, place, and time. No cranial nerve deficit. He exhibits normal muscle tone. Coordination normal.  No ataxia on finger to nose bilaterally. No pronator drift. 5/5 strength throughout. CN 2-12 intact. Negative Romberg. Equal grip strength. Sensation intact. Gait is normal.   Skin: Skin is warm.  Psychiatric: He has a normal mood and affect. His behavior is  normal.  Nursing note and vitals reviewed.   ED Course  Procedures (including critical care time) Labs Review Labs Reviewed  CBC - Abnormal; Notable for the following:    Hemoglobin 12.9 (*)    HCT 37.4 (*)    Platelets 134 (*)    All other components within normal limits  COMPREHENSIVE METABOLIC PANEL - Abnormal; Notable for the following:    Glucose, Bld 261 (*)    BUN 48 (*)    Creatinine, Ser 6.86 (*)    Total Protein 5.7 (*)    Albumin 2.7 (*)    AST 14 (*)    ALT 16 (*)    GFR calc non Af Amer 8 (*)    GFR calc Af Amer 9 (*)    All other components within normal limits  ACETAMINOPHEN LEVEL - Abnormal; Notable for the following:    Acetaminophen (Tylenol), Serum <10 (*)  All other components within normal limits  URINALYSIS, ROUTINE W REFLEX MICROSCOPIC (NOT AT [facility]) - Abnormal; Notable for the following:    Glucose, UA 500 (*)    Hgb urine dipstick SMALL (*)    Ketones, ur 15 (*)    Protein, ur >300 (*)    All other components within normal limits  I-STAT CHEM 8, ED - Abnormal; Notable for the following:    BUN 47 (*)    Creatinine, Ser 6.90 (*)    Glucose, Bld 190 (*)    All other components within normal limits  DIFFERENTIAL  URINE RAPID DRUG SCREEN, HOSP PERFORMED  ETHANOL  SALICYLATE LEVEL  PROTIME-INR  APTT  URINE MICROSCOPIC-ADD ON  I-STAT TROPOININ, ED  Randolm Idol, ED    Imaging Review Dg Chest 2 View  03/21/2015   CLINICAL DATA:  Right chest pain for 3 months  EXAM: CHEST  2 VIEW  COMPARISON:  02/25/2015  FINDINGS: The heart size and mediastinal contours are within normal limits. Both lungs are clear. Stable degenerative changes thoracic spine.  IMPRESSION: No active cardiopulmonary disease.   Electronically Signed   By: Lahoma Crocker M.D.   On: 03/21/2015 17:01   Ct Head Wo Contrast  03/21/2015   CLINICAL DATA:  Hallucinations, slurred speech since this morning  EXAM: CT HEAD WITHOUT CONTRAST  TECHNIQUE: Contiguous axial images were obtained  from the base of the skull through the vertex without intravenous contrast.  COMPARISON:  02/25/2015  FINDINGS: Calvarium intact. Mild diffuse atrophy. No focal attenuation abnormality to suggest infarct or mass. Mild low attenuation diffusely in the white matter suggests involutional change. No hemorrhage or extra-axial fluid.  IMPRESSION: No acute findings.   Electronically Signed   By: Skipper Cliche M.D.   On: 03/21/2015 18:49     EKG Interpretation   Date/Time:  Friday March 21 2015 17:21:16 EDT Ventricular Rate:  61 PR Interval:  180 QRS Duration: 107 QT Interval:  408 QTC Calculation: 411 R Axis:   -48 Text Interpretation:  Sinus rhythm Multiform ventricular premature  complexes LAD, consider left anterior fascicular block Probable  anteroseptal infarct, recent No significant change was found Confirmed by  Wyvonnia Dusky  MD, Etai Copado [phone]) on 03/21/2015 5:24:01 PM      MDM   Final diagnoses:  Hallucinations  Slurred speech   Patients with hallucinations only with his eyes closed since last night. Slurred speech episode this morning now resolved.  Recent TIA workup included a negative echocardiogram and carotid Dopplers with minimal stenosis. Started on Plavix. Echo with EF 60% normal LV function. Carotid dopplar no significant stenosis  Presentation discussed with Dr. Janann Colonel neurology. He recommends MRI. He feels that this is negative patient can be discharged as he has recently had echocardiogram and carotid Dopplers. Continue Plavix. Dr. Janann Colonel states patient may benefit from outpatient EEG is occasionally occipital seizures can present as hallucinations.  Labs and UA unrevealing. CT head normal.  Patient does not want to wait for MRI. He states he will schedule with his PCP. He will leave [place] as he knows that his stroke has not been ruled out. He is alert and oriented x3 and appears to have capacity to refuse treatment. Follow up with neurology. He notes  he is free to return at any point in time.  Ezequiel Essex, MD 03/22/15 [phone]

## 2015-03-21 NOTE — ED Notes (Signed)
Pt placed into hospital gown and on monitor upon arrival to room. Pt monitored by  Blood pressure and pulse ox.

## 2015-03-21 NOTE — ED Notes (Signed)
Patient returned from CT

## 2015-03-21 NOTE — ED Notes (Signed)
Patient returned from X-ray 

## 2015-03-21 NOTE — ED Notes (Signed)
Pt reports having hallucinations since this am, they occur when closing his eyes, was sent here for MRI. Pt reports similar episode occurred 4 weeks ago. Reports episodes started after starting new medication for shingles. No acute distress noted at triage, no neuro deficits noted and pt is a&ox4.

## 2015-03-30 ENCOUNTER — Emergency Department (HOSPITAL_COMMUNITY): Payer: BLUE CROSS/BLUE SHIELD

## 2015-03-30 ENCOUNTER — Encounter (HOSPITAL_COMMUNITY): Payer: Self-pay | Admitting: *Deleted

## 2015-03-30 ENCOUNTER — Inpatient Hospital Stay (HOSPITAL_COMMUNITY)
Admission: EM | Admit: 2015-03-30 | Discharge: 2015-04-02 | DRG: 069 | Disposition: A | Discharge status: Home / Self Care | Payer: BLUE CROSS/BLUE SHIELD | Attending: Internal Medicine | Admitting: Internal Medicine

## 2015-03-30 DIAGNOSIS — T375X1A Poisoning by antiviral drugs, accidental (unintentional), initial encounter: Secondary | ICD-10-CM

## 2015-03-30 DIAGNOSIS — Z7982 Long term (current) use of aspirin: Secondary | ICD-10-CM

## 2015-03-30 DIAGNOSIS — G9340 Encephalopathy, unspecified: Secondary | ICD-10-CM | POA: Diagnosis present

## 2015-03-30 DIAGNOSIS — D696 Thrombocytopenia, unspecified: Secondary | ICD-10-CM | POA: Diagnosis present

## 2015-03-30 DIAGNOSIS — E1165 Type 2 diabetes mellitus with hyperglycemia: Secondary | ICD-10-CM | POA: Diagnosis not present

## 2015-03-30 DIAGNOSIS — N2581 Secondary hyperparathyroidism of renal origin: Secondary | ICD-10-CM | POA: Diagnosis present

## 2015-03-30 DIAGNOSIS — R739 Hyperglycemia, unspecified: Secondary | ICD-10-CM

## 2015-03-30 DIAGNOSIS — E785 Hyperlipidemia, unspecified: Secondary | ICD-10-CM | POA: Diagnosis present

## 2015-03-30 DIAGNOSIS — M1990 Unspecified osteoarthritis, unspecified site: Secondary | ICD-10-CM | POA: Diagnosis present

## 2015-03-30 DIAGNOSIS — D649 Anemia, unspecified: Secondary | ICD-10-CM | POA: Diagnosis present

## 2015-03-30 DIAGNOSIS — R4701 Aphasia: Secondary | ICD-10-CM | POA: Diagnosis present

## 2015-03-30 DIAGNOSIS — Z8673 Personal history of transient ischemic attack (TIA), and cerebral infarction without residual deficits: Secondary | ICD-10-CM

## 2015-03-30 DIAGNOSIS — K219 Gastro-esophageal reflux disease without esophagitis: Secondary | ICD-10-CM | POA: Diagnosis present

## 2015-03-30 DIAGNOSIS — E1122 Type 2 diabetes mellitus with diabetic chronic kidney disease: Secondary | ICD-10-CM | POA: Diagnosis present

## 2015-03-30 DIAGNOSIS — Z992 Dependence on renal dialysis: Secondary | ICD-10-CM

## 2015-03-30 DIAGNOSIS — T375X5A Adverse effect of antiviral drugs, initial encounter: Secondary | ICD-10-CM | POA: Diagnosis present

## 2015-03-30 DIAGNOSIS — Z7902 Long term (current) use of antithrombotics/antiplatelets: Secondary | ICD-10-CM

## 2015-03-30 DIAGNOSIS — R410 Disorientation, unspecified: Secondary | ICD-10-CM

## 2015-03-30 DIAGNOSIS — G459 Transient cerebral ischemic attack, unspecified: Principal | ICD-10-CM | POA: Diagnosis present

## 2015-03-30 DIAGNOSIS — F419 Anxiety disorder, unspecified: Secondary | ICD-10-CM | POA: Diagnosis present

## 2015-03-30 DIAGNOSIS — N186 End stage renal disease: Secondary | ICD-10-CM

## 2015-03-30 DIAGNOSIS — F4323 Adjustment disorder with mixed anxiety and depressed mood: Secondary | ICD-10-CM | POA: Clinically undetermined

## 2015-03-30 DIAGNOSIS — I120 Hypertensive chronic kidney disease with stage 5 chronic kidney disease or end stage renal disease: Secondary | ICD-10-CM | POA: Diagnosis present

## 2015-03-30 DIAGNOSIS — R4182 Altered mental status, unspecified: Secondary | ICD-10-CM

## 2015-03-30 DIAGNOSIS — T50905A Adverse effect of unspecified drugs, medicaments and biological substances, initial encounter: Secondary | ICD-10-CM

## 2015-03-30 LAB — COMPREHENSIVE METABOLIC PANEL
ALT: 19 U/L (ref 17–63)
AST: 18 U/L (ref 15–41)
Albumin: 2.8 g/dL — ABNORMAL LOW (ref 3.5–5.0)
Alkaline Phosphatase: 50 U/L (ref 38–126)
Anion gap: 11 (ref 5–15)
BILIRUBIN TOTAL: 0.6 mg/dL (ref 0.3–1.2)
BUN: 33 mg/dL — ABNORMAL HIGH (ref 6–20)
CO2: 23 mmol/L (ref 22–32)
Calcium: 8.9 mg/dL (ref 8.9–10.3)
Chloride: 100 mmol/L — ABNORMAL LOW (ref 101–111)
Creatinine, Ser: 6.48 mg/dL — ABNORMAL HIGH (ref 0.61–1.24)
GFR calc Af Amer: 9 mL/min — ABNORMAL LOW (ref >60)
GFR calc non Af Amer: 8 mL/min — ABNORMAL LOW (ref >60)
Glucose, Bld: 306 mg/dL — ABNORMAL HIGH (ref 65–99)
Potassium: 4.2 mmol/L (ref 3.5–5.1)
Sodium: 134 mmol/L — ABNORMAL LOW (ref 135–145)
Total Protein: 6.1 g/dL — ABNORMAL LOW (ref 6.5–8.1)

## 2015-03-30 LAB — CBC
HCT: 39.7 % (ref 39.0–52.0)
Hemoglobin: 14.1 g/dL (ref 13.0–17.0)
MCH: 31.4 pg (ref 26.0–34.0)
MCHC: 35.5 g/dL (ref 30.0–36.0)
MCV: 88.4 fL (ref 78.0–100.0)
Platelets: 127 10*3/uL — ABNORMAL LOW (ref 150–400)
RBC: 4.49 MIL/uL (ref 4.22–5.81)
RDW: 13.6 % (ref 11.5–15.5)
WBC: 7.8 10*3/uL (ref 4.0–10.5)

## 2015-03-30 LAB — CBG MONITORING, ED: Glucose-Capillary: 285 mg/dL — ABNORMAL HIGH (ref 65–99)

## 2015-03-30 LAB — TSH: TSH: 2.618 u[IU]/mL (ref 0.350–4.500)

## 2015-03-30 LAB — MRSA PCR SCREENING: MRSA BY PCR: NEGATIVE

## 2015-03-30 LAB — AMMONIA: AMMONIA: 19 umol/L (ref 9–35)

## 2015-03-30 LAB — GLUCOSE, CAPILLARY
GLUCOSE-CAPILLARY: 176 mg/dL — AB (ref 65–99)
Glucose-Capillary: 156 mg/dL — ABNORMAL HIGH (ref 65–99)

## 2015-03-30 LAB — VITAMIN B12: VITAMIN B 12: 836 pg/mL (ref 180–914)

## 2015-03-30 MED ORDER — ACETAMINOPHEN 650 MG RE SUPP: 650.0000 mg | Freq: Four times a day (QID) | RECTAL | Status: DC | PRN

## 2015-03-30 MED ORDER — SODIUM CHLORIDE 0.9 % IJ SOLN
3.0000 mL | Freq: Two times a day (BID) | INTRAMUSCULAR | Status: DC
  Administered 2015-03-30: 22:00:00 via INTRAVENOUS
  Administered 2015-03-31 – 2015-04-01 (×4): 3 mL via INTRAVENOUS

## 2015-03-30 MED ORDER — INSULIN ASPART 100 UNIT/ML ~~LOC~~ SOLN
10.0000 [IU] | Freq: Once | SUBCUTANEOUS | Status: DC
  Filled 2015-03-30: qty 1

## 2015-03-30 MED ORDER — ACETAMINOPHEN 325 MG PO TABS: 650.0000 mg | ORAL_TABLET | Freq: Four times a day (QID) | ORAL | Status: DC | PRN

## 2015-03-30 MED ORDER — HALOPERIDOL LACTATE 5 MG/ML IJ SOLN
5.0000 mg | Freq: Four times a day (QID) | INTRAMUSCULAR | Status: DC | PRN
  Administered 2015-03-31: 5 mg via INTRAVENOUS
  Filled 2015-03-30: qty 1

## 2015-03-30 MED ORDER — NEPRO/CARBSTEADY PO LIQD
237.0000 mL | Freq: Two times a day (BID) | ORAL | Status: DC
  Administered 2015-03-31: 237 mL via ORAL

## 2015-03-30 MED ORDER — SODIUM CHLORIDE 0.9 % IV BOLUS (SEPSIS): 1000.0000 mL | Freq: Once | INTRAVENOUS | Status: DC

## 2015-03-30 MED ORDER — LORAZEPAM 2 MG/ML IJ SOLN
2.0000 mg | Freq: Once | INTRAMUSCULAR | Status: AC
  Administered 2015-03-30: 2 mg via INTRAVENOUS
  Filled 2015-03-30: qty 1

## 2015-03-30 MED ORDER — ONDANSETRON HCL 4 MG PO TABS: 4.0000 mg | ORAL_TABLET | Freq: Four times a day (QID) | ORAL | Status: DC | PRN

## 2015-03-30 MED ORDER — ENOXAPARIN SODIUM 30 MG/0.3ML ~~LOC~~ SOLN
30.0000 mg | SUBCUTANEOUS | Status: DC
  Administered 2015-03-30 – 2015-04-01 (×3): 30 mg via SUBCUTANEOUS
  Filled 2015-03-30 (×3): qty 0.3

## 2015-03-30 MED ORDER — RENA-VITE PO TABS
1.0000 | ORAL_TABLET | Freq: Every day | ORAL | Status: DC
  Administered 2015-03-30 – 2015-04-02 (×4): 1 via ORAL
  Filled 2015-03-30 (×4): qty 1

## 2015-03-30 MED ORDER — INSULIN DETEMIR 100 UNIT/ML ~~LOC~~ SOLN
30.0000 [IU] | Freq: Every day | SUBCUTANEOUS | Status: DC
  Administered 2015-03-30 – 2015-04-01 (×3): 30 [IU] via SUBCUTANEOUS
  Filled 2015-03-30 (×6): qty 0.3

## 2015-03-30 MED ORDER — ASPIRIN EC 81 MG PO TBEC
81.0000 mg | DELAYED_RELEASE_TABLET | Freq: Every day | ORAL | Status: DC
  Administered 2015-03-30 – 2015-04-02 (×4): 81 mg via ORAL
  Filled 2015-03-30 (×4): qty 1

## 2015-03-30 MED ORDER — SODIUM CHLORIDE 0.9 % IV BOLUS (SEPSIS)
250.0000 mL | Freq: Once | INTRAVENOUS | Status: AC
  Administered 2015-03-30: 250 mL via INTRAVENOUS

## 2015-03-30 MED ORDER — LISINOPRIL 20 MG PO TABS
20.0000 mg | ORAL_TABLET | Freq: Every day | ORAL | Status: DC
  Administered 2015-03-31 – 2015-04-01 (×2): 20 mg via ORAL
  Filled 2015-03-30 (×2): qty 1

## 2015-03-30 MED ORDER — ONDANSETRON HCL 4 MG/2ML IJ SOLN
4.0000 mg | Freq: Four times a day (QID) | INTRAMUSCULAR | Status: DC | PRN
Start: 2015-03-30 — End: 2015-04-02

## 2015-03-30 MED ORDER — ACETAMINOPHEN 500 MG PO TABS: 1000.0000 mg | ORAL_TABLET | Freq: Three times a day (TID) | ORAL | Status: DC | PRN

## 2015-03-30 MED ORDER — SIMVASTATIN 40 MG PO TABS: 40.0000 mg | ORAL_TABLET | Freq: Every day | ORAL | Status: DC

## 2015-03-30 MED ORDER — INSULIN ASPART 100 UNIT/ML ~~LOC~~ SOLN
0.0000 [IU] | Freq: Three times a day (TID) | SUBCUTANEOUS | Status: DC
  Administered 2015-03-31: 3 [IU] via SUBCUTANEOUS
  Administered 2015-03-31: 2 [IU] via SUBCUTANEOUS
  Administered 2015-04-01: 5 [IU] via SUBCUTANEOUS
  Administered 2015-04-01 – 2015-04-02 (×2): 2 [IU] via SUBCUTANEOUS

## 2015-03-30 MED ORDER — AMLODIPINE BESYLATE 2.5 MG PO TABS
2.5000 mg | ORAL_TABLET | Freq: Every day | ORAL | Status: DC
  Administered 2015-03-30 – 2015-03-31 (×2): 2.5 mg via ORAL
  Filled 2015-03-30 (×2): qty 1

## 2015-03-30 MED ORDER — CLOPIDOGREL BISULFATE 75 MG PO TABS
75.0000 mg | ORAL_TABLET | Freq: Every day | ORAL | Status: DC
  Administered 2015-03-30 – 2015-04-02 (×4): 75 mg via ORAL
  Filled 2015-03-30 (×4): qty 1

## 2015-03-30 MED ORDER — INSULIN ASPART 100 UNIT/ML ~~LOC~~ SOLN: 0.0000 [IU] | Freq: Every day | SUBCUTANEOUS | Status: DC

## 2015-03-30 NOTE — ED Provider Notes (Addendum)
CSN: [id number]     Arrival date & time 03/30/15  1111 History   First MD Initiated Contact with Patient 03/30/15 1329     Chief Complaint  Patient presents with  . Altered Mental Status  . Aphasia     (Consider location/radiation/quality/duration/timing/severity/associated sxs/prior Treatment) Patient is a 64 y.o. male presenting with altered mental status. The history is provided by the patient.  Altered Mental Status Presenting symptoms: confusion   Associated symptoms: no abdominal pain, no fever, no headaches, no rash, no vomiting and no weakness   Patient with altered mental status, periods confusions, and hallucinations in the past month.  Family indicates started after admission to [facility] w 'TIA' last month.  pts friend indicates symptoms seem worse, now not taking insulin, checking sugars, or ablet to care for self or mother. States pt at times seems globally confused, at times non compliant w meds, at other times talks of seeing things, but unsure of what is seeing. No auditory hallucinations. Denies hx similar symptoms, no hx dementia, no hx psychiatric illness. No recent falls or trauma. No headache. No change in speech. Denies any unilateral numbness/weakness. Family states 'the doctors' wanted to get mri during recent ED visit and [facility] inpatient stay, and that pt refused.  Pt/family deny change in medication except to say that at some point in the past month was started on med for shingles, valtrex.  When asked if had shingles/rash, pt and family say no, but that patient had c/o chronic/recurrent pain in flank area on right, and so pcp decided to give rx. Pt w esrd on hd, had normal dialysis 2 days ago.       Past Medical History  Diagnosis Date  . Diabetes mellitus without complication   . Renal disorder   . Blood disease     states that he makes too many red blood cells.  . Hypertension     history of, off medication now  . Hyperlipidemia   . GERD (gastroesophageal reflux  disease)     history  . Arthritis   . Carpal tunnel syndrome   . ESRD (end stage renal disease)     on Dialysis M, W, F   Past Surgical History  Procedure Laterality Date  . Hernia repair  1995    umbiccal  . Hand surgery Right     tendon and muscle repair after injury  . Av fistula placement Left 09/14/2013    Procedure: ARTERIOVENOUS (AV) [place];  Surgeon: Rosetta Posner, MD;  Location: [facility] OR;  Service: Vascular;  Laterality: Left;   Family History  Problem Relation Age of Onset  . Cancer Father   . Diabetes Father   . Heart disease Father     Heart Disease before age 73  . Heart attack Father   . Hypertension Daughter   . Hyperlipidemia Son   . Glaucoma Mother    Social History  Substance Use Topics  . Smoking status: Never Smoker   . Smokeless tobacco: Never Used  . Alcohol Use: No    Review of Systems  Constitutional: Negative for fever and chills.  HENT: Negative for sore throat.   Eyes: Negative for pain and visual disturbance.  Respiratory: Negative for shortness of breath.   Cardiovascular: Negative for chest pain.  Gastrointestinal: Negative for vomiting, abdominal pain and diarrhea.  Endocrine: Negative for polyuria.  Genitourinary: Negative for dysuria and flank pain.  Musculoskeletal: Negative for back pain and neck pain.  Skin: Negative  for rash.  Neurological: Negative for weakness, numbness and headaches.  Hematological: Does not bruise/bleed easily.  Psychiatric/Behavioral: Positive for confusion.      Allergies  Penicillins  Home Medications   Prior to Admission medications   Medication Sig Start Date End Date Taking? Authorizing Provider  acetaminophen (TYLENOL) 500 MG tablet Take 1,000 mg by mouth every 8 (eight) hours as needed for mild pain or moderate pain.     Historical Provider, MD  amLODipine (NORVASC) 2.5 MG tablet Take 1 tablet (2.5 mg total) by mouth daily. 02/26/15   Radene Gunning, NP  atorvastatin (LIPITOR) 20  MG tablet Take 1 tablet (20 mg total) by mouth daily at 6 PM. Patient not taking: Reported on 03/21/2015 02/26/15   Radene Gunning, NP  clopidogrel (PLAVIX) 75 MG tablet Take 1 tablet (75 mg total) by mouth daily. 02/26/15   Radene Gunning, NP  ibuprofen (ADVIL,MOTRIN) 200 MG tablet Take 200 mg by mouth every 6 (six) hours as needed for headache, mild pain or moderate pain.    Historical Provider, MD  Insulin Detemir (LEVEMIR FLEXTOUCH) 100 UNIT/ML Pen Inject 60 Units into the skin every morning.    Historical Provider, MD  lisinopril (PRINIVIL,ZESTRIL) 20 MG tablet Take 20 mg by mouth daily at 12 noon. 03/07/15   Historical Provider, MD  multivitamin (RENA-VIT) TABS tablet Take 1 tablet by mouth daily.    Historical Provider, MD  simvastatin (ZOCOR) 40 MG tablet Take 40 mg by mouth daily at 6 PM.    Historical Provider, MD   BP 172/65 mmHg  Pulse 62  Temp(Src) 97.7 F (36.5 C) (Oral)  Resp 18  Wt 233 lb (105.688 kg)  SpO2 96% Physical Exam  Constitutional: He is oriented to person, place, and time. He appears well-developed and well-nourished. No distress.  HENT:  Head: Atraumatic.  Mouth/Throat: Oropharynx is clear and moist.  Eyes: Conjunctivae are normal. Pupils are equal, round, and reactive to light.  Neck: Normal range of motion. Neck supple. No tracheal deviation present.  Cardiovascular: Normal rate, regular rhythm, normal heart sounds and intact distal pulses.  Exam reveals no gallop and no friction rub.   No murmur heard. Pulmonary/Chest: Effort normal and breath sounds normal. No accessory muscle usage. No respiratory distress. He exhibits no tenderness.  Abdominal: Soft. Bowel sounds are normal. He exhibits no distension. There is no tenderness.  Genitourinary:  No cva tenderness  Musculoskeletal: Normal range of motion. He exhibits no edema or tenderness.  Neurological: He is alert and oriented to person, place, and time. No cranial nerve deficit.  Motor intact bil, stre 5/5. No  pronator drift. Coordination grossly intact.  Pt globally confused, oriented to person only, not day, date, or place. Unusual affect   Skin: Skin is warm and dry. No rash noted. He is not diaphoretic.  Psychiatric:  Odd affect, confused   Nursing note and vitals reviewed.   ED Course  Procedures (including critical care time) Labs Review   Results for orders placed or performed during the hospital encounter of 03/30/15  Comprehensive metabolic panel  Result Value Ref Range   Sodium 134 (L) 135 - 145 mmol/L   Potassium 4.2 3.5 - 5.1 mmol/L   Chloride 100 (L) 101 - 111 mmol/L   CO2 23 22 - 32 mmol/L   Glucose, Bld 306 (H) 65 - 99 mg/dL   BUN 33 (H) 6 - 20 mg/dL   Creatinine, Ser 6.48 (H) 0.61 - 1.24 mg/dL   Calcium  8.9 8.9 - 10.3 mg/dL   Total Protein 6.1 (L) 6.5 - 8.1 g/dL   Albumin 2.8 (L) 3.5 - 5.0 g/dL   AST 18 15 - 41 U/L   ALT 19 17 - 63 U/L   Alkaline Phosphatase 50 38 - 126 U/L   Total Bilirubin 0.6 0.3 - 1.2 mg/dL   GFR calc non Af Amer 8 (L) >60 mL/min   GFR calc Af Amer 9 (L) >60 mL/min   Anion gap 11 5 - 15  CBC  Result Value Ref Range   WBC 7.8 4.0 - 10.5 K/uL   RBC 4.49 4.22 - 5.81 MIL/uL   Hemoglobin 14.1 13.0 - 17.0 g/dL   HCT 39.7 39.0 - 52.0 %   MCV 88.4 78.0 - 100.0 fL   MCH 31.4 26.0 - 34.0 pg   MCHC 35.5 30.0 - 36.0 g/dL   RDW 13.6 11.5 - 15.5 %   Platelets 127 (L) 150 - 400 K/uL  CBG monitoring, ED  Result Value Ref Range   Glucose-Capillary 285 (H) 65 - 99 mg/dL   Comment 1 Notify RN    Comment 2 Document in Chart    Dg Chest 2 View  03/21/2015   CLINICAL DATA:  Right chest pain for 3 months  EXAM: CHEST  2 VIEW  COMPARISON:  02/25/2015  FINDINGS: The heart size and mediastinal contours are within normal limits. Both lungs are clear. Stable degenerative changes thoracic spine.  IMPRESSION: No active cardiopulmonary disease.   Electronically Signed   By: Lahoma Crocker M.D.   On: 03/21/2015 17:01   Ct Head Wo Contrast  03/21/2015   CLINICAL DATA:   Hallucinations, slurred speech since this morning  EXAM: CT HEAD WITHOUT CONTRAST  TECHNIQUE: Contiguous axial images were obtained from the base of the skull through the vertex without intravenous contrast.  COMPARISON:  02/25/2015  FINDINGS: Calvarium intact. Mild diffuse atrophy. No focal attenuation abnormality to suggest infarct or mass. Mild low attenuation diffusely in the white matter suggests involutional change. No hemorrhage or extra-axial fluid.  IMPRESSION: No acute findings.   Electronically Signed   By: Skipper Cliche M.D.   On: 03/21/2015 18:49       I, Galya Dunnigan E, personally reviewed and evaluated these images and lab results as part of my medical decision-making.   EKG Interpretation   Date/Time:  Sunday March 30 2015 13:21:24 EDT Ventricular Rate:  61 PR Interval:  169 QRS Duration: 107 QT Interval:  441 QTC Calculation: 444 R Axis:   -33 Text Interpretation:  Sinus rhythm Left axis deviation No significant  change since last tracing Confirmed by Ashok Cordia  MD, Lennette Bihari ([id number]) on  03/30/2015 1:52:54 PM      MDM   Iv ns. Labs. Family indicates mri ordered re: recent cva workup, and requests in ED.  Reviewed nursing notes and prior charts for additional history.   Blood sugar in 300s.   Dialysis pt, therefore will not give large volume ns boluses.  250 cc ns. novolog sq.  Mri pending.   Recheck pt confused, disoriented.   It appears pt is on valtrex 1 gm tid, which would be high/too high dose given renal failure, and could be responsible for cns effects. Will hold.   Given degree mental status changes, confusion, unable to manage sugars, disorientation, will admit to med service.       Lajean Saver, MD 03/30/15 [phone]

## 2015-03-30 NOTE — ED Notes (Signed)
Patient is a dialysis patient.  Patient reported to have tia 2 weeks ago.  Patient lives at home with mom.  Patient reports that he has been laying around all weekend.  Patient had dialysis treatment on Friday.  Patient has not been able to monitor his sugar due to being out of strip  Patient is having pain in his right side and into his back for months.  Patient is on valcyclovir for ? Shingles.  Patient has no rash.  Patient is currently able to answer questions.  Patient is here with a friend.  Patient signed out ama due to frustration during last visit.

## 2015-03-30 NOTE — ED Notes (Signed)
Patient has increased confusion when he returned from MRI. Patient is agitated. Patient placed on the cardiac monitor. Patient has tremors bilateral in arms. MD made aware.

## 2015-03-30 NOTE — ED Notes (Signed)
Patient continues to be agitated. Family at the bedside. Encouraged to rest. Trying to decrease stimulation. Patient distracted with television.

## 2015-03-30 NOTE — ED Notes (Signed)
Attempted Report x1.   

## 2015-03-30 NOTE — H&P (Signed)
History and Physical  Kenneth [name]:[id number] DOB: 1951/06/13 DOA: 03/30/2015   PCP: Leonides Grills, MD  Referring Physician: ED/ Dr. Ashok Cordia  Chief Complaint: confusion  HPI:  64 year old male with history of ESRD, diabetes mellitus, hypertension, hyperlipidemia presents with approximately one-month history of progressive confusion, agitation, and intermittent hallucinations. The patient is awake and alert, but he is unable to provide any significant history secondary to his confusion. Unfortunately, his mother with whom he lives is unable to provide any significant history other than the fact that the patient has been increasingly confused. A family friend in the room states that the patient has had some slurred speech in the past 24 hours. He denies any focal collection or weakness, chest discomfort, shortness breath, headache, vomiting, diarrhea. He has been confused to the point where he has been forgetting to take his insulin and not checking his CBGs it is unclear whether the patient has actually been taking any of his other medications.   Interestingly, the patient has still been going to dialysis. Apparently the patient went to dialysis on Friday, 03/28/2015. According to the medical record, the patient was also agitated during his last admission when he was discharged on 02/26/2015. Apparently, the patient had a working diagnosis of a TIA when he presented with slurred speech. The patient refused dialysis and refuses MRI of the brain at that time.  In the emergency department, the patient was noted to have essentially unremarkable BMP except for serum creatinine 6.48. Serum glucose was 306. The patient was mildly, cytopenic with bolus of 127,000. WBC was 7.8, hemoglobin 14.1. EKG shows sinus rhythm without any ST-T wave changes. The patient was afebrile and hemodynamically stable with oxygen saturation 100% on room air. Assessment/Plan: Acute encephalopathy -Check for  reversible causes of encephalopathy -question Hypertensive encephalopathy--doubt pt has been taking his meds--BP 183/71 in ED -Per the patient's family he has not been missing dialysis, but it is unclear if he has been taking himself off early--??uremia -Check ammonia, TSH, B12 -CT of the brain is negative for acute findings - 03/30/2015 MRI of the brain--negative for acute intracranial abnormality  -On 03/21/2015, urine drug screen was negative -haldol prn agitation -Patient had a urine drug screen that was negative on 03/21/2015  ESRD  -nephrology for maintenance dialysis--already consulted Hypertension  -Continue lisinopril  -Continue amlodipine  RLQ abdominal pain -Apparently, this has been on and off for the past month  -Patient's last bowel movement 03/29/2015  -Examination reveals benign abdomen  -Patient is too agitated for CT of the abdomen and pelvis presently  History of TIA  -Continue aspirin and Plavix  Diabetes mellitus type II  -Half home dose of Levemir  -NovoLog sliding scale  -Doubt compliance with insulin at home -02/25/2015 hemoglobin A1c 11.1 Thrombocytopenia -This appears to be chronic dating back to 2008 -Monitor for signs of bleeding     Past Medical History  Diagnosis Date  . Diabetes mellitus without complication   . Renal disorder   . Blood disease     states that he makes too many red blood cells.  . Hypertension     history of, off medication now  . Hyperlipidemia   . GERD (gastroesophageal reflux disease)     history  . Arthritis   . Carpal tunnel syndrome   . ESRD (end stage renal disease)     on Dialysis M, W, F   Past Surgical History  Procedure Laterality Date  . Hernia repair  1995  umbiccal  . Hand surgery Right     tendon and muscle repair after injury  . Av fistula placement Left 09/14/2013    Procedure: ARTERIOVENOUS (AV) [place];  Surgeon: Rosetta Posner, MD;  Location: [facility];  Service: Vascular;   Laterality: Left;   Social History:  reports that he has never smoked. He has never used smokeless tobacco. He reports that he does not drink alcohol or use illicit drugs.   Family History  Problem Relation Age of Onset  . Cancer Father   . Diabetes Father   . Heart disease Father     Heart Disease before age 36  . Heart attack Father   . Hypertension Daughter   . Hyperlipidemia Son   . Glaucoma Mother      Allergies  Allergen Reactions  . Penicillins Other (See Comments)     Family history of allergies and hence he does not use this - states mom's MD told him never to take it.      Prior to Admission medications   Medication Sig Start Date End Date Taking? Authorizing Provider  acetaminophen (TYLENOL) 500 MG tablet Take 1,000 mg by mouth every 8 (eight) hours as needed for mild pain or moderate pain.    Yes Historical Provider, MD  amLODipine (NORVASC) 2.5 MG tablet Take 1 tablet (2.5 mg total) by mouth daily. 02/26/15  Yes Radene Gunning, NP  aspirin EC 81 MG tablet Take 81 mg by mouth daily.   Yes Historical Provider, MD  clopidogrel (PLAVIX) 75 MG tablet Take 1 tablet (75 mg total) by mouth daily. 02/26/15  Yes Lezlie Octave Black, NP  ibuprofen (ADVIL,MOTRIN) 200 MG tablet Take 200 mg by mouth every 6 (six) hours as needed for headache, mild pain or moderate pain.   Yes Historical Provider, MD  Insulin Detemir (LEVEMIR FLEXTOUCH) 100 UNIT/ML Pen Inject 60 Units into the skin every morning.   Yes Historical Provider, MD  lisinopril (PRINIVIL,ZESTRIL) 20 MG tablet Take 20 mg by mouth daily at 12 noon. 03/07/15  Yes Historical Provider, MD  multivitamin (RENA-VIT) TABS tablet Take 1 tablet by mouth daily.   Yes Historical Provider, MD  simvastatin (ZOCOR) 40 MG tablet Take 40 mg by mouth daily at 6 PM.   Yes Historical Provider, MD  traMADol (ULTRAM) 50 MG tablet Take 50 mg by mouth every 6 (six) hours as needed for moderate pain or severe pain.  03/27/15  Yes Historical Provider, MD    valACYclovir (VALTREX) 500 MG tablet Take 100 mg by mouth 3 (three) times daily.   Yes Historical Provider, MD  atorvastatin (LIPITOR) 20 MG tablet Take 1 tablet (20 mg total) by mouth daily at 6 PM. Patient not taking: Reported on 03/21/2015 02/26/15   Radene Gunning, NP    Review of Systems:  Constitutional:  No weight loss, night sweats, Fevers, chills, fatigue.  Head&Eyes: No headache.  No vision loss.  No eye pain or scotoma ENT:  No Difficulty swallowing,Tooth/dental problems,Sore throat,  No ear ache, post nasal drip,  Cardio-vascular:  No chest pain, Orthopnea, PND, swelling in lower extremities,  dizziness, palpitations  GI:  No  abdominal pain, nausea, vomiting, diarrhea, loss of appetite, hematochezia, melena, heartburn, indigestion, Resp:  No shortness of breath with exertion or at rest. No cough. No coughing up of blood .No wheezing.No chest wall deformity  Skin:  no rash or lesions.  GU:  no dysuria, change in color of urine, no urgency or  frequency. No flank pain.  Musculoskeletal:  No joint pain or swelling. No decreased range of motion. No back pain.  Psych:  No change in mood or affect. No depression or anxiety. Neurologic: No headache, no dysesthesia, no focal weakness, no vision loss. No syncope  Physical Exam: Filed Vitals:   03/30/15 1545 03/30/15 1600 03/30/15 1617 03/30/15 1716  BP:   183/71   Pulse: 68 64 65   Temp:    97.7 F (36.5 C)  TempSrc:      Resp: 18 21 17    Weight:      SpO2: 99% 97% 100%    General:  A&O x 3, NAD, nontoxic, pleasant/cooperative Head/Eye: No conjunctival hemorrhage, no icterus, [place]/AT, No nystagmus ENT:  No icterus,  No thrush, good dentition, no pharyngeal exudate Neck:  No masses, no lymphadenpathy, no bruits CV:  RRR, no rub, no gallop, no S3 Lung:  CTAB, good air movement, no wheeze, no rhonchi Abdomen: soft/NT, +BS, nondistended, no peritoneal signs Ext: No cyanosis, No rashes, No petechiae, No lymphangitis, No  edema Neuro: CNII-XII intact, strength 4/5 in bilateral upper and lower extremities, no dysmetria  Labs on Admission:  Basic Metabolic Panel:  Recent Labs Lab 03/30/15 1148  NA 134*  K 4.2  CL 100*  CO2 23  GLUCOSE 306*  BUN 33*  CREATININE 6.48*  CALCIUM 8.9   Liver Function Tests:  Recent Labs Lab 03/30/15 1148  AST 18  ALT 19  ALKPHOS 50  BILITOT 0.6  PROT 6.1*  ALBUMIN 2.8*   No results for input(s): LIPASE, AMYLASE in the last 168 hours. No results for input(s): AMMONIA in the last 168 hours. CBC:  Recent Labs Lab 03/30/15 1148  WBC 7.8  HGB 14.1  HCT 39.7  MCV 88.4  PLT 127*   Cardiac Enzymes: No results for input(s): CKTOTAL, CKMB, CKMBINDEX, TROPONINI in the last 168 hours. BNP: Invalid input(s): POCBNP CBG:  Recent Labs Lab 03/30/15 1145  GLUCAP 285*    Radiological Exams on Admission: Mr Brain Wo Contrast  03/30/2015   CLINICAL DATA:  Slurred speech. Confusion. End-stage renal disease on dialysis.  EXAM: MRI HEAD WITHOUT CONTRAST  TECHNIQUE: Multiplanar, multiecho pulse sequences of the brain and surrounding structures were obtained without intravenous contrast.  COMPARISON:  Head CT 03/21/2015  FINDINGS: Images are mildly to moderately degraded by motion artifact.  There is no evidence of acute infarct, intracranial hemorrhage, mass, midline shift, or extra-axial fluid collection. Mild cerebral atrophy is within normal limits for age. Small foci of T2 hyperintensity in the subcortical and deep cerebral white matter are nonspecific but compatible with mild chronic small vessel ischemic disease.  Orbits are unremarkable. No significant inflammatory disease is seen in the paranasal sinuses or mastoid air cells. The distal right vertebral artery appears small. Major intracranial vascular flow voids are grossly preserved.  IMPRESSION: 1. No acute intracranial abnormality. 2. Mild chronic small vessel ischemic disease.   Electronically Signed   By: Logan Bores   On: 03/30/2015 16:33    EKG: Independently reviewed. Sinus rhythm, no ST-T wave changes    Time spent:70 minutes Code Status:   FULL Family Communication:   Family at bedside   Marquez Ceesay, DO  [facility] Pager [phone]  If 7PM-7AM, please contact night-coverage [email or web address] Password [facility] 03/30/2015, 6:17 PM

## 2015-03-30 NOTE — ED Notes (Signed)
CBG: 176 °

## 2015-03-30 NOTE — Progress Notes (Signed)
Pt arrived to the unit.

## 2015-03-30 NOTE — ED Notes (Signed)
Patient returned from MRI.

## 2015-03-30 NOTE — ED Notes (Signed)
MD made aware of patient's fear of small spaces.

## 2015-03-30 NOTE — ED Notes (Signed)
Patient being transported by [place], [place].

## 2015-03-31 ENCOUNTER — Inpatient Hospital Stay (HOSPITAL_COMMUNITY): Payer: BLUE CROSS/BLUE SHIELD

## 2015-03-31 DIAGNOSIS — G9340 Encephalopathy, unspecified: Secondary | ICD-10-CM

## 2015-03-31 DIAGNOSIS — F4323 Adjustment disorder with mixed anxiety and depressed mood: Secondary | ICD-10-CM | POA: Clinically undetermined

## 2015-03-31 LAB — BASIC METABOLIC PANEL
ANION GAP: 12 (ref 5–15)
BUN: 36 mg/dL — ABNORMAL HIGH (ref 6–20)
CO2: 21 mmol/L — ABNORMAL LOW (ref 22–32)
Calcium: 8.8 mg/dL — ABNORMAL LOW (ref 8.9–10.3)
Chloride: 104 mmol/L (ref 101–111)
Creatinine, Ser: 7.36 mg/dL — ABNORMAL HIGH (ref 0.61–1.24)
GFR calc Af Amer: 8 mL/min — ABNORMAL LOW (ref >60)
GFR, EST NON AFRICAN AMERICAN: 7 mL/min — AB (ref >60)
Glucose, Bld: 85 mg/dL (ref 65–99)
POTASSIUM: 3.8 mmol/L (ref 3.5–5.1)
SODIUM: 137 mmol/L (ref 135–145)

## 2015-03-31 LAB — CBC
HEMATOCRIT: 40.3 % (ref 39.0–52.0)
HEMOGLOBIN: 13.7 g/dL (ref 13.0–17.0)
MCH: 30.4 pg (ref 26.0–34.0)
MCHC: 34 g/dL (ref 30.0–36.0)
MCV: 89.4 fL (ref 78.0–100.0)
Platelets: 128 10*3/uL — ABNORMAL LOW (ref 150–400)
RBC: 4.51 MIL/uL (ref 4.22–5.81)
RDW: 13.8 % (ref 11.5–15.5)
WBC: 7.4 10*3/uL (ref 4.0–10.5)

## 2015-03-31 LAB — GLUCOSE, CAPILLARY
GLUCOSE-CAPILLARY: 142 mg/dL — AB (ref 65–99)
GLUCOSE-CAPILLARY: 160 mg/dL — AB (ref 65–99)
Glucose-Capillary: 91 mg/dL (ref 65–99)
Glucose-Capillary: 179 mg/dL — ABNORMAL HIGH (ref 65–99)

## 2015-03-31 MED ORDER — CALCIUM ACETATE (PHOS BINDER) 667 MG PO CAPS
1334.0000 mg | ORAL_CAPSULE | Freq: Three times a day (TID) | ORAL | Status: DC
  Administered 2015-03-31 – 2015-04-02 (×6): 1334 mg via ORAL
  Filled 2015-03-31 (×6): qty 2

## 2015-03-31 MED ORDER — AMLODIPINE BESYLATE 10 MG PO TABS
10.0000 mg | ORAL_TABLET | Freq: Every day | ORAL | Status: DC
  Administered 2015-03-31 – 2015-04-02 (×3): 10 mg via ORAL
  Filled 2015-03-31 (×3): qty 1

## 2015-03-31 MED ORDER — HYDRALAZINE HCL 20 MG/ML IJ SOLN
10.0000 mg | Freq: Four times a day (QID) | INTRAMUSCULAR | Status: DC | PRN
  Administered 2015-03-31: 10 mg via INTRAVENOUS
  Filled 2015-03-31: qty 1

## 2015-03-31 MED ORDER — FLUOXETINE HCL 20 MG PO CAPS
20.0000 mg | ORAL_CAPSULE | Freq: Every day | ORAL | Status: DC
  Administered 2015-03-31 – 2015-04-01 (×2): 20 mg via ORAL
  Filled 2015-03-31 (×2): qty 1

## 2015-03-31 MED ORDER — ATORVASTATIN CALCIUM 20 MG PO TABS
20.0000 mg | ORAL_TABLET | Freq: Every day | ORAL | Status: DC
  Administered 2015-03-31 – 2015-04-01 (×2): 20 mg via ORAL
  Filled 2015-03-31 (×2): qty 1

## 2015-03-31 MED ORDER — HYDRALAZINE HCL 50 MG PO TABS
50.0000 mg | ORAL_TABLET | Freq: Three times a day (TID) | ORAL | Status: DC
  Administered 2015-03-31 – 2015-04-01 (×4): 50 mg via ORAL
  Filled 2015-03-31 (×4): qty 1

## 2015-03-31 MED ORDER — FLUOXETINE HCL 20 MG PO CAPS
20.0000 mg | ORAL_CAPSULE | Freq: Every day | ORAL | Status: DC
  Administered 2015-03-31: 20 mg via ORAL
  Filled 2015-03-31: qty 1

## 2015-03-31 NOTE — Procedures (Signed)
ELECTROENCEPHALOGRAM REPORT   Patient: Kenneth [name]      Room #: 4G-92 Age: 64 y.o.        Sex: male Referring Physician: Dr Candiss Norse Report Date:  03/31/2015        Interpreting Physician: Hulen Luster  History: [name] is an 64 y.o. male admitted with altered mental status  Medications:  Scheduled: . amLODipine  10 mg Oral Daily  . aspirin EC  81 mg Oral Daily  . atorvastatin  20 mg Oral q1800  . calcium acetate  1,334 mg Oral TID WC  . clopidogrel  75 mg Oral Daily  . enoxaparin (LOVENOX) injection  30 mg Subcutaneous Q24H  . feeding supplement (NEPRO CARB STEADY)  237 mL Oral BID BM  . FLUoxetine  20 mg Oral QHS  . hydrALAZINE  50 mg Oral 3 times per day  . insulin aspart  0-15 Units Subcutaneous TID WC  . insulin aspart  0-5 Units Subcutaneous QHS  . insulin aspart  10 Units Subcutaneous Once  . insulin detemir  30 Units Subcutaneous QHS  . lisinopril  20 mg Oral Q1200  . multivitamin  1 tablet Oral Daily  . sodium chloride  3 mL Intravenous Q12H    Conditions of Recording:  This is a 19channel EEG carried out with the patient in the awake state.  Description:  The waking background activity consists of a very low voltage, symmetrical, fairly well organized mix of alpha and beta activity, seen from the parieto-occipital and posterior temporal regions.  No focal slowing or epileptiform activity is noted.   The patient drowses with slowing to irregular, low voltage theta and beta activity. Normal sleep architecture is not observed.   Hyperventilation was not performed. Intermittent photic stimulation was performed but failed to illicit any change in the tracing.    IMPRESSION: Normal electroencephalogram. There are no focal lateralizing or epileptiform features.   Jim Like, DO [facility] [phone]  If 7pm- 7am, please page neurology on call as listed in [place]. 03/31/2015, 5:25 PM

## 2015-03-31 NOTE — Progress Notes (Signed)
Called dialysis, spoke with Debroah Baller', advised of patient's BP and what meds he is ordered tonight. Wanting to know what to hold d/t for dialysis tonight. Was told to hold all BP meds even though BP is 172/83.

## 2015-03-31 NOTE — Consult Note (Signed)
[facility] Renal Consultation Note  Indication for Consultation:  Management of ESRD/hemodialysis; anemia, hypertension/volume and secondary hyperparathyroidism  HPI: Kenneth [name] is a 64 y.o. male  Admitted with encephalopathy . He last had HD Friday on Schedule and per OP HD RN was stable no MS changes at [facility] and "Recent dc from ER  03/21/15 for TIA= slurred Speech and Hallucinations." He has had some issues with Insurance covering his op Insulin per op Therapist, sports. He currently is confused not able to give me much history except remembers being confused and admitted. Mother from home  He lives with not available to give history. Serum glucose was 306 in ER,with WBC was 7.8,afebrile /hemoglobin 14.1.hgb  EKG = SR  without any ST-T wave changes. hemodynamically stable oxygen saturation 100% on room air. MRI Brain yest.=  Mild sm. chronic sm. vessel ischemic brain dz, no acute abnormalities.     Past Medical History  Diagnosis Date  . Diabetes mellitus without complication   . Renal disorder   . Blood disease     states that he makes too many red blood cells.  . Hypertension     history of, off medication now  . Hyperlipidemia   . GERD (gastroesophageal reflux disease)     history  . Arthritis   . Carpal tunnel syndrome   . ESRD (end stage renal disease)     on Dialysis M, W, F    Past Surgical History  Procedure Laterality Date  . Hernia repair  1995    umbiccal  . Hand surgery Right     tendon and muscle repair after injury  . Av fistula placement Left 09/14/2013    Procedure: ARTERIOVENOUS (AV) [place];  Surgeon: Rosetta Posner, MD;  Location: [facility] OR;  Service: Vascular;  Laterality: Left;      Family History  Problem Relation Age of Onset  . Cancer Father   . Diabetes Father   . Heart disease Father     Heart Disease before age 64  . Heart attack Father   . Hypertension Daughter   . Hyperlipidemia Son   . Glaucoma Mother        reports that he has never smoked. He has never used smokeless tobacco. He reports that he does not drink alcohol or use illicit drugs.   Allergies  Allergen Reactions  . Penicillins Other (See Comments)     Family history of allergies and hence he does not use this - states mom's MD told him never to take it.    Prior to Admission medications   Medication Sig Start Date End Date Taking? Authorizing Provider  acetaminophen (TYLENOL) 500 MG tablet Take 1,000 mg by mouth every 8 (eight) hours as needed for mild pain or moderate pain.    Yes Historical Provider, MD  amLODipine (NORVASC) 2.5 MG tablet Take 1 tablet (2.5 mg total) by mouth daily. 02/26/15  Yes Radene Gunning, NP  aspirin EC 81 MG tablet Take 81 mg by mouth daily.   Yes Historical Provider, MD  clopidogrel (PLAVIX) 75 MG tablet Take 1 tablet (75 mg total) by mouth daily. 02/26/15  Yes Lezlie Octave Black, NP  ibuprofen (ADVIL,MOTRIN) 200 MG tablet Take 200 mg by mouth every 6 (six) hours as needed for headache, mild pain or moderate pain.   Yes Historical Provider, MD  Insulin Detemir (LEVEMIR FLEXTOUCH) 100 UNIT/ML Pen Inject 60 Units into the skin every morning.   Yes  Historical Provider, MD  lisinopril (PRINIVIL,ZESTRIL) 20 MG tablet Take 20 mg by mouth daily at 12 noon. 03/07/15  Yes Historical Provider, MD  multivitamin (RENA-VIT) TABS tablet Take 1 tablet by mouth daily.   Yes Historical Provider, MD  simvastatin (ZOCOR) 40 MG tablet Take 40 mg by mouth daily at 6 PM.   Yes Historical Provider, MD  traMADol (ULTRAM) 50 MG tablet Take 50 mg by mouth every 6 (six) hours as needed for moderate pain or severe pain.  03/27/15  Yes Historical Provider, MD  valACYclovir (VALTREX) 500 MG tablet Take 100 mg by mouth 3 (three) times daily.   Yes Historical Provider, MD  atorvastatin (LIPITOR) 20 MG tablet Take 1 tablet (20 mg total) by mouth daily at 6 PM. Patient not taking: Reported on 03/21/2015 02/26/15   Radene Gunning, NP      Anti-infectives    None      Results for orders placed or performed during the hospital encounter of 03/30/15 (from the past 48 hour(s))  CBG monitoring, ED     Status: Abnormal   Collection Time: 03/30/15 11:45 AM  Result Value Ref Range   Glucose-Capillary 285 (H) 65 - 99 mg/dL   Comment 1 Notify RN    Comment 2 Document in Chart   Comprehensive metabolic panel     Status: Abnormal   Collection Time: 03/30/15 11:48 AM  Result Value Ref Range   Sodium 134 (L) 135 - 145 mmol/L   Potassium 4.2 3.5 - 5.1 mmol/L   Chloride 100 (L) 101 - 111 mmol/L   CO2 23 22 - 32 mmol/L   Glucose, Bld 306 (H) 65 - 99 mg/dL   BUN 33 (H) 6 - 20 mg/dL   Creatinine, Ser 6.48 (H) 0.61 - 1.24 mg/dL   Calcium 8.9 8.9 - 10.3 mg/dL   Total Protein 6.1 (L) 6.5 - 8.1 g/dL   Albumin 2.8 (L) 3.5 - 5.0 g/dL   AST 18 15 - 41 U/L   ALT 19 17 - 63 U/L   Alkaline Phosphatase 50 38 - 126 U/L   Total Bilirubin 0.6 0.3 - 1.2 mg/dL   GFR calc non Af Amer 8 (L) >60 mL/min   GFR calc Af Amer 9 (L) >60 mL/min    Comment: (NOTE) The eGFR has been calculated using the CKD EPI equation. This calculation has not been validated in all clinical situations. eGFR's persistently <60 mL/min signify possible Chronic Kidney Disease.    Anion gap 11 5 - 15  CBC     Status: Abnormal   Collection Time: 03/30/15 11:48 AM  Result Value Ref Range   WBC 7.8 4.0 - 10.5 K/uL   RBC 4.49 4.22 - 5.81 MIL/uL   Hemoglobin 14.1 13.0 - 17.0 g/dL   HCT 39.7 39.0 - 52.0 %   MCV 88.4 78.0 - 100.0 fL   MCH 31.4 26.0 - 34.0 pg   MCHC 35.5 30.0 - 36.0 g/dL   RDW 13.6 11.5 - 15.5 %   Platelets 127 (L) 150 - 400 K/uL  Glucose, capillary     Status: Abnormal   Collection Time: 03/30/15  4:04 PM  Result Value Ref Range   Glucose-Capillary 176 (H) 65 - 99 mg/dL  Ammonia     Status: None   Collection Time: 03/30/15  8:05 PM  Result Value Ref Range   Ammonia 19 9 - 35 umol/L  TSH     Status: None   Collection Time: 03/30/15  8:05  PM   Result Value Ref Range   TSH 2.618 0.350 - 4.500 uIU/mL  Vitamin B12     Status: None   Collection Time: 03/30/15  8:05 PM  Result Value Ref Range   Vitamin B-12 836 180 - 914 pg/mL    Comment: (NOTE) This assay is not validated for testing neonatal or myeloproliferative syndrome specimens for Vitamin B12 levels.   MRSA PCR Screening     Status: None   Collection Time: 03/30/15  9:27 PM  Result Value Ref Range   MRSA by PCR NEGATIVE NEGATIVE    Comment:        The GeneXpert MRSA Assay (FDA approved for NASAL specimens only), is one component of a comprehensive MRSA colonization surveillance program. It is not intended to diagnose MRSA infection nor to guide or monitor treatment for MRSA infections.   Glucose, capillary     Status: Abnormal   Collection Time: 03/30/15  9:47 PM  Result Value Ref Range   Glucose-Capillary 156 (H) 65 - 99 mg/dL  Basic metabolic panel     Status: Abnormal   Collection Time: 03/31/15  5:50 AM  Result Value Ref Range   Sodium 137 135 - 145 mmol/L   Potassium 3.8 3.5 - 5.1 mmol/L   Chloride 104 101 - 111 mmol/L   CO2 21 (L) 22 - 32 mmol/L   Glucose, Bld 85 65 - 99 mg/dL   BUN 36 (H) 6 - 20 mg/dL   Creatinine, Ser 7.36 (H) 0.61 - 1.24 mg/dL   Calcium 8.8 (L) 8.9 - 10.3 mg/dL   GFR calc non Af Amer 7 (L) >60 mL/min   GFR calc Af Amer 8 (L) >60 mL/min    Comment: (NOTE) The eGFR has been calculated using the CKD EPI equation. This calculation has not been validated in all clinical situations. eGFR's persistently <60 mL/min signify possible Chronic Kidney Disease.    Anion gap 12 5 - 15  CBC     Status: Abnormal   Collection Time: 03/31/15  5:50 AM  Result Value Ref Range   WBC 7.4 4.0 - 10.5 K/uL   RBC 4.51 4.22 - 5.81 MIL/uL   Hemoglobin 13.7 13.0 - 17.0 g/dL   HCT 40.3 39.0 - 52.0 %   MCV 89.4 78.0 - 100.0 fL   MCH 30.4 26.0 - 34.0 pg   MCHC 34.0 30.0 - 36.0 g/dL   RDW 13.8 11.5 - 15.5 %   Platelets 128 (L) 150 - 400 K/uL   Glucose, capillary     Status: None   Collection Time: 03/31/15  7:52 AM  Result Value Ref Range   Glucose-Capillary 91 65 - 99 mg/dL     ROS: see hpi for pos   Physical Exam: Filed Vitals:   03/31/15 0515  BP: 181/66  Pulse: 82  Temp: 97.8 F (36.6 C)  Resp:      General: slightly  Agitated  but NAD / alert WM  HEENT: [place], non icterc, MMM Neck: no jvd Heart: regular  Tachy, no rub or mur. Lungs: CTA , non labored breathing  Abdomen: BS pos , soft , NT ND Extremities: no pedal edema Skin: warm dry , no acute rash Neuro: alert, agitated , disoriented to "friday, 16th''  But recognizes me from [facility] and knows [facility]. moves all extrem indep . "i want to walk some"  Dialysis Orders: [facility]   on MWF . 200 dialyzer  EDW 110  KG but Last 3 tx leaving below 106.4, 106.8 , 108.1  And stable bp  HD Bath 2k, 2 ca  Time 4hrs 75mn  Heparin  10,000 initial then 3000 bolius. Access LUA AVF      PO Calcitriol 0.780m/HD    Other op labs  hgb 13.2, ca 9.1 phos 6.4 pth 400   Assessment/Plan 1. Encephalopathy = Admit team wu / ?TIA  2. ESRD -  MWF HD , HD today  3. HO TIA in recent past 03/21/15  4. Hypertension/volume  - 181/66 some agitation palying  role with Sbp >??/ bp meds and uf to edw on hd 5. Anemia  - hggb 13.7  No esa  6. Metabolic bone disease -  bindersa and Vit d per kid center hx 7. Nutrition - renal / carb mod  8. DM Type 2- per Admit 9. Thrombocytopenia - Chronic   DaErnest HaberPA-C [phone]/15/2016, 10:21 AM

## 2015-03-31 NOTE — Progress Notes (Signed)
Patient requested to go home and refused to have TELE removed. MD aware. CCMD called and notified of pt refusal of TELE application. Sitter at bedside.   Ave Filter, RN

## 2015-03-31 NOTE — Consult Note (Signed)
[place] [facility] Consult   Reason for Consult:  AMS, confusion, agitation, and intermittent hallucinations,  and suicide ideattion Referring Physician:  Dr. Candiss Norse Patient Identification: Kenneth [name] MRN:  [id number] Principal Diagnosis: Adjustment disorder with mixed anxiety and depressed mood Diagnosis:   Patient Active Problem List   Diagnosis Date Noted  . Acute encephalopathy [G93.40] 03/30/2015  . 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 ESRD on dialysis [N18.6, Z99.2]   . TIA (transient ischemic attack) [G45.9] 02/25/2015  . ESRD (end stage renal disease) [N18.6]   . Encounter for surgical aftercare following surgery of circulatory system [Z48.812] 01/09/2015  . End stage renal disease [N18.6] 10/16/2013  . Chronic kidney disease, stage IV (severe) [N18.4] 08/28/2013  . Diabetes mellitus with kidney disease [E11.21] 12/25/2007  . HYPERLIPIDEMIA [E78.5] 07/14/2006  . MORBID OBESITY [E66.01] 07/14/2006  . CARPAL TUNNEL SYNDROME [G56.00] 07/14/2006  . Hypertension [I10] 07/14/2006  . RENAL DISEASE, CHRONIC, STAGE III [N18.3] 07/14/2006  . NEPHROLITHIASIS [N20.0] 07/14/2006  . 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 PROTEINURIA [R80.9] 07/14/2006    Total Time spent with patient: 1 hour  Subjective:   Kenneth [name] is a 64 y.o. male patient admitted with confusion, agitation and intermittent hallucinations.  HPI: Kenneth [name] is a 64 years old male seen today and chart reviewed for increased symptoms of depression, anxiety, agitation and also possible suicidal ideation. Reportedly patient was brought him by his friend to the hospital for increased confusion, agitation and intermittent hallucinations and possible suicidal ideation. Patient reported he has been taking care of his elderly mother who has been legally blind. Patient reportedly separated from his wife since 27 and he has 3  grownup children. Patient daughter has power of attorney who lives in [place]. Patient has been suffering with the multiple medical problems including chronic renal failure and been on hemodialysis 3 times a week. Patient reportedly compliant with his medications and dialysis treatment as prescribed to him. Patient had a mainly sedation for possible stroke or TIA when he presented with slurred speech. Patient has no appetite neurovascular emergency. Patient has no previous history of acute psychiatric hospitalization or outpatient medication management. Patient endorses symptoms of depression, anxiety, insomnia but denies current suicidal/homicidal ideation, intention or plans. Patient has no symptoms of psychosis. Patient has intact cognitions including orientation, concentration, memory and language functions. Patient is willing to participate in medication management for depression but does not want to be in hospital because he has to take care of his mother at home.  Patient has been driving around the community and able to care for himself on his mother. Patient is also able to hire the people if needed at home.    HPI Elements:   Location:  Depression and anxiety. Quality:  Fair. Severity:  Chronic. Timing:  Some forgetfulness. Duration:  More than a month. Context:  Psychosocial stresses and chronic medical problems.  Past Medical History:  Past Medical History  Diagnosis Date  . Diabetes mellitus without complication   . Renal disorder   . Blood disease     states that he makes too many red blood cells.  . Hypertension     history of, off medication now  . Hyperlipidemia   . GERD (gastroesophageal reflux disease)     history  . Arthritis   . Carpal tunnel syndrome   . ESRD (end stage renal disease)     on Dialysis M, W, F  Past Surgical History  Procedure Laterality Date  . Hernia repair  1995    umbiccal  . Hand surgery Right     tendon and muscle repair after injury  .  Av fistula placement Left 09/14/2013    Procedure: ARTERIOVENOUS (AV) [place];  Surgeon: Rosetta Posner, MD;  Location: [facility] OR;  Service: Vascular;  Laterality: Left;   Family History:  Family History  Problem Relation Age of Onset  . Cancer Father   . Diabetes Father   . Heart disease Father     Heart Disease before age 36  . Heart attack Father   . Hypertension Daughter   . Hyperlipidemia Son   . Glaucoma Mother    Social History:  History  Alcohol Use No     History  Drug Use No    Social History   Social History  . Marital Status: Divorced    Spouse Name: N/A  . Number of Children: N/A  . Years of Education: N/A   Social History Main Topics  . Smoking status: Never Smoker   . Smokeless tobacco: Never Used  . Alcohol Use: No  . Drug Use: No  . Sexual Activity: Not Asked   Other Topics Concern  . None   Social History Narrative   Additional Social History:                          Allergies:   Allergies  Allergen Reactions  . Penicillins Other (See Comments)     Family history of allergies and hence he does not use this - states mom's MD told him never to take it.    Labs:  Results for orders placed or performed during the hospital encounter of 03/30/15 (from the past 48 hour(s))  CBG monitoring, ED     Status: Abnormal   Collection Time: 03/30/15 11:45 AM  Result Value Ref Range   Glucose-Capillary 285 (H) 65 - 99 mg/dL   Comment 1 Notify RN    Comment 2 Document in Chart   Comprehensive metabolic panel     Status: Abnormal   Collection Time: 03/30/15 11:48 AM  Result Value Ref Range   Sodium 134 (L) 135 - 145 mmol/L   Potassium 4.2 3.5 - 5.1 mmol/L   Chloride 100 (L) 101 - 111 mmol/L   CO2 23 22 - 32 mmol/L   Glucose, Bld 306 (H) 65 - 99 mg/dL   BUN 33 (H) 6 - 20 mg/dL   Creatinine, Ser 6.48 (H) 0.61 - 1.24 mg/dL   Calcium 8.9 8.9 - 10.3 mg/dL   Total Protein 6.1 (L) 6.5 - 8.1 g/dL   Albumin 2.8 (L) 3.5 - 5.0 g/dL    AST 18 15 - 41 U/L   ALT 19 17 - 63 U/L   Alkaline Phosphatase 50 38 - 126 U/L   Total Bilirubin 0.6 0.3 - 1.2 mg/dL   GFR calc non Af Amer 8 (L) >60 mL/min   GFR calc Af Amer 9 (L) >60 mL/min    Comment: (NOTE) The eGFR has been calculated using the CKD EPI equation. This calculation has not been validated in all clinical situations. eGFR's persistently <60 mL/min signify possible Chronic Kidney Disease.    Anion gap 11 5 - 15  CBC     Status: Abnormal   Collection Time: 03/30/15 11:48 AM  Result Value Ref Range   WBC 7.8 4.0 - 10.5 K/uL  RBC 4.49 4.22 - 5.81 MIL/uL   Hemoglobin 14.1 13.0 - 17.0 g/dL   HCT 39.7 39.0 - 52.0 %   MCV 88.4 78.0 - 100.0 fL   MCH 31.4 26.0 - 34.0 pg   MCHC 35.5 30.0 - 36.0 g/dL   RDW 13.6 11.5 - 15.5 %   Platelets 127 (L) 150 - 400 K/uL  Glucose, capillary     Status: Abnormal   Collection Time: 03/30/15  4:04 PM  Result Value Ref Range   Glucose-Capillary 176 (H) 65 - 99 mg/dL  Ammonia     Status: None   Collection Time: 03/30/15  8:05 PM  Result Value Ref Range   Ammonia 19 9 - 35 umol/L  TSH     Status: None   Collection Time: 03/30/15  8:05 PM  Result Value Ref Range   TSH 2.618 0.350 - 4.500 uIU/mL  Vitamin B12     Status: None   Collection Time: 03/30/15  8:05 PM  Result Value Ref Range   Vitamin B-12 836 180 - 914 pg/mL    Comment: (NOTE) This assay is not validated for testing neonatal or myeloproliferative syndrome specimens for Vitamin B12 levels.   MRSA PCR Screening     Status: None   Collection Time: 03/30/15  9:27 PM  Result Value Ref Range   MRSA by PCR NEGATIVE NEGATIVE    Comment:        The GeneXpert MRSA Assay (FDA approved for NASAL specimens only), is one component of a comprehensive MRSA colonization surveillance program. It is not intended to diagnose MRSA infection nor to guide or monitor treatment for MRSA infections.   Glucose, capillary     Status: Abnormal   Collection Time: 03/30/15  9:47 PM   Result Value Ref Range   Glucose-Capillary 156 (H) 65 - 99 mg/dL  Basic metabolic panel     Status: Abnormal   Collection Time: 03/31/15  5:50 AM  Result Value Ref Range   Sodium 137 135 - 145 mmol/L   Potassium 3.8 3.5 - 5.1 mmol/L   Chloride 104 101 - 111 mmol/L   CO2 21 (L) 22 - 32 mmol/L   Glucose, Bld 85 65 - 99 mg/dL   BUN 36 (H) 6 - 20 mg/dL   Creatinine, Ser 7.36 (H) 0.61 - 1.24 mg/dL   Calcium 8.8 (L) 8.9 - 10.3 mg/dL   GFR calc non Af Amer 7 (L) >60 mL/min   GFR calc Af Amer 8 (L) >60 mL/min    Comment: (NOTE) The eGFR has been calculated using the CKD EPI equation. This calculation has not been validated in all clinical situations. eGFR's persistently <60 mL/min signify possible Chronic Kidney Disease.    Anion gap 12 5 - 15  CBC     Status: Abnormal   Collection Time: 03/31/15  5:50 AM  Result Value Ref Range   WBC 7.4 4.0 - 10.5 K/uL   RBC 4.51 4.22 - 5.81 MIL/uL   Hemoglobin 13.7 13.0 - 17.0 g/dL   HCT 40.3 39.0 - 52.0 %   MCV 89.4 78.0 - 100.0 fL   MCH 30.4 26.0 - 34.0 pg   MCHC 34.0 30.0 - 36.0 g/dL   RDW 13.8 11.5 - 15.5 %   Platelets 128 (L) 150 - 400 K/uL  Glucose, capillary     Status: None   Collection Time: 03/31/15  7:52 AM  Result Value Ref Range   Glucose-Capillary 91 65 - 99 mg/dL  Vitals: Blood pressure 181/66, pulse 82, temperature 97.8 F (36.6 C), temperature source Oral, resp. rate 17, weight 105.688 kg (233 lb), SpO2 94 %.  Risk to Self: Is patient at risk for suicide?: Yes Risk to Others:   Prior Inpatient Therapy:   Prior Outpatient Therapy:    Current Facility-Administered Medications  Medication Dose Route Frequency Provider Last Rate Last Dose  . acetaminophen (TYLENOL) tablet 650 mg  650 mg Oral Q6H PRN Orson Eva, MD       Or  . acetaminophen (TYLENOL) suppository 650 mg  650 mg Rectal Q6H PRN Orson Eva, MD      . amLODipine (NORVASC) tablet 2.5 mg  2.5 mg Oral Daily Orson Eva, MD   2.5 mg at 03/31/15 0827  . aspirin EC  tablet 81 mg  81 mg Oral Daily Orson Eva, MD   81 mg at 03/31/15 0827  . clopidogrel (PLAVIX) tablet 75 mg  75 mg Oral Daily Orson Eva, MD   75 mg at 03/31/15 3354  . enoxaparin (LOVENOX) injection 30 mg  30 mg Subcutaneous Q24H Orson Eva, MD   30 mg at 03/30/15 2209  . feeding supplement (NEPRO CARB STEADY) liquid 237 mL  237 mL Oral BID BM Orson Eva, MD   237 mL at 03/31/15 0828  . haloperidol lactate (HALDOL) injection 5 mg  5 mg Intravenous Q6H PRN Orson Eva, MD   5 mg at 03/31/15 0333  . hydrALAZINE (APRESOLINE) injection 10 mg  10 mg Intravenous Q6H PRN Dianne Dun, NP   10 mg at 03/31/15 5625  . insulin aspart (novoLOG) injection 0-15 Units  0-15 Units Subcutaneous TID WC Orson Eva, MD   0 Units at 03/31/15 [id number]  . insulin aspart (novoLOG) injection 0-5 Units  0-5 Units Subcutaneous QHS Orson Eva, MD   0 Units at 03/30/15 2211  . insulin aspart (novoLOG) injection 10 Units  10 Units Subcutaneous Once Lajean Saver, MD   Stopped at 03/30/15 1616  . insulin detemir (LEVEMIR) injection 30 Units  30 Units Subcutaneous QHS Orson Eva, MD   30 Units at 03/30/15 2258  . lisinopril (PRINIVIL,ZESTRIL) tablet 20 mg  20 mg Oral Q1200 Orson Eva, MD   20 mg at 03/31/15 0827  . multivitamin (RENA-VIT) tablet 1 tablet  1 tablet Oral Daily Orson Eva, MD   1 tablet at 03/31/15 0827  . ondansetron (ZOFRAN) tablet 4 mg  4 mg Oral Q6H PRN Orson Eva, MD       Or  . ondansetron [facility]) injection 4 mg  4 mg Intravenous Q6H PRN Orson Eva, MD      . simvastatin (ZOCOR) tablet 40 mg  40 mg Oral q1800 Orson Eva, MD      . sodium chloride 0.9 % injection 3 mL  3 mL Intravenous Q12H Orson Eva, MD   3 mL at 03/31/15 0830    Musculoskeletal: Strength & Muscle Tone: within normal limits Gait & Station: normal Patient leans: N/A  Psychiatric Specialty Exam: Physical ExamAs per history and physical   ROSDenied nausea vomiting shortness of breath and chest pain No Fever-chills, No Headache, No changes  with Vision or hearing, reports vertigo No problems swallowing food or Liquids, No Chest pain, Cough or Shortness of Breath, No Abdominal pain, No Nausea or Vommitting, Bowel movements are regular, No Blood in stool or Urine, No dysuria, No new skin rashes or bruises, No new joints pains-aches,  No new weakness, tingling, numbness in any extremity, No recent weight  gain or loss, No polyuria, polydypsia or polyphagia,   A full 10 point Review of Systems was done, except as stated above, all other Review of Systems were negative.  Blood pressure 181/66, pulse 82, temperature 97.8 F (36.6 C), temperature source Oral, resp. rate 17, weight 105.688 kg (233 lb), SpO2 94 %.Body mass index is 35.44 kg/(m^2).  General Appearance: Casual  Eye Contact::  Good  Speech:  Clear and Coherent  Volume:  Normal  Mood:  Anxious and Depressed  Affect:  Appropriate and Congruent  Thought Process:  Coherent and Goal Directed  Orientation:  Full (Time, Place, and Person)  Thought Content:  WDL  Suicidal Thoughts:  No  Homicidal Thoughts:  No  Memory:  Immediate;   Fair Recent;   Fair  Judgement:  Intact  Insight:  Fair  Psychomotor Activity:  Decreased  Concentration:  Fair  Recall:  Good  Fund of Knowledge:Good  Language: Good  Akathisia:  Negative  Handed:  Right  AIMS (if indicated):     Assets:  Communication Skills Desire for Improvement Financial Resources/Insurance Housing Leisure Time Resilience Social Support Talents/Skills Transportation  ADL's:  Intact  Cognition: WNL  Sleep:      Medical Decision Making: New problem, with additional work up planned, Review of Psycho-Social Stressors (1), Review or order clinical lab tests (1), Review of Last Therapy Session (1), Review or order medicine tests (1), Review of Medication Regimen & Side Effects (2) and Review of New Medication or Change in Dosage (2)  Treatment Plan Summary: Daily contact with patient to assess and evaluate  symptoms and progress in treatment and Medication management  Plan:  Discontinue safety sitter as patient contract for safety and denies current suicidal ideation We start fluoxetine 20 mg daily for depression and anxiety Patient does not meet criteria for psychiatric inpatient admission. Supportive therapy provided about ongoing stressors. Appreciate psychiatric consultation and follow-up as needed Please contact [phone] or [phone] if needs further assistance  Disposition: Patient will be referred to the outpatient medication management and medically stable.   Kenneth [name],JANARDHAHA R. 03/31/2015 11:04 AM

## 2015-03-31 NOTE — Progress Notes (Addendum)
Patient Demographics:    Kenneth [name], is a 64 y.o. male, DOB - 1951-03-05, OAC:[id number]  Admit date - 03/30/2015   Admitting Physician Orson Eva, MD  Outpatient Primary MD for the patient is Leonides Grills, MD  LOS - 1   Chief Complaint  Patient presents with  . Altered Mental Status  . Aphasia        Subjective:    Kenneth [name] today has, No headache, No chest pain, No abdominal pain - No Nausea, No new weakness tingling or numbness, No Cough - SOB.     Assessment  & Plan :    1. Encephalopathy. Question hypertensive versus undiagnosed vascular dementia - will control blood pressure, have increased Norvasc and added hydralazine, as needed IV hydralazine, ammonia level, TSH and B-12 stable. Will check RPR and HIV. Have requested neuro to evaluate one time.   2. Central hypertension. Meds adjusted for better control   3. ESRD. On Monday Wednesday Friday dialysis, refusing dialysis, renal consulted. Her daughter to come and talk to the patient.   4. Dyslipidemia. Continue home dose statin.    5. Reported suicidal ideation in ER. Sitter bedside, psych consulted. He denies any suicida ideation or homicidal ideation at this time.    6. DM type II. Lantus and sliding-scale continue to monitor.  Lab Results  Component Value Date   HGBA1C 11.1* 02/25/2015    CBG (last 3)   Recent Labs  03/30/15 1604 03/30/15 2147 03/31/15 0752  GLUCAP 176* 156* 91       Code Status : Full  Family Communication  : daughter in detail  Disposition Plan  : TBD  Consults  :  Renal, Neuro, Psych  Procedures  :   CT and MRI Brain - Non Acute  DVT Prophylaxis  :  Lovenox    Lab Results  Component Value Date   PLT 128* 03/31/2015    Inpatient Medications  Scheduled Meds: .  amLODipine  10 mg Oral Daily  . aspirin EC  81 mg Oral Daily  . clopidogrel  75 mg Oral Daily  . enoxaparin (LOVENOX) injection  30 mg Subcutaneous Q24H  . feeding supplement (NEPRO CARB STEADY)  237 mL Oral BID BM  . hydrALAZINE  50 mg Oral 3 times per day  . insulin aspart  0-15 Units Subcutaneous TID WC  . insulin aspart  0-5 Units Subcutaneous QHS  . insulin aspart  10 Units Subcutaneous Once  . insulin detemir  30 Units Subcutaneous QHS  . lisinopril  20 mg Oral Q1200  . multivitamin  1 tablet Oral Daily  . simvastatin  40 mg Oral q1800  . sodium chloride  3 mL Intravenous Q12H   Continuous Infusions:  PRN Meds:.acetaminophen **OR** acetaminophen, haloperidol lactate, hydrALAZINE, ondansetron **OR** ondansetron (ZOFRAN) IV  Antibiotics  :     Anti-infectives    None        Objective:   Filed Vitals:   03/30/15 1830 03/30/15 1847 03/30/15 2140 03/31/15 0515  BP: 162/69 193/82 187/69 181/66  Pulse:  70 73 82  Temp:  97.7 F (36.5 C) 98.4 F (36.9 C) 97.8 F (36.6 C)  TempSrc:   Oral Oral  Resp: 21 16 17    Weight:  SpO2:  99% 98% 94%    Wt Readings from Last 3 Encounters:  03/30/15 105.688 kg (233 lb)  03/21/15 108.863 kg (240 lb)  02/25/15 108.863 kg (240 lb)    No intake or output data in the 24 hours ending 03/31/15 1140   Physical Exam  Awake Alert, Oriented X 3, No new F.N deficits, Normal affect [place].AT,PERRAL Supple Neck,No JVD, No cervical lymphadenopathy appriciated.  Symmetrical Chest wall movement, Good air movement bilaterally, CTAB RRR,No Gallops,Rubs or new Murmurs, No Parasternal Heave +ve B.Sounds, Abd Soft, No tenderness, No organomegaly appriciated, No rebound - guarding or rigidity. No Cyanosis, Clubbing or edema, No new Rash or bruise       Data Review:   Micro Results Recent Results (from the past 240 hour(s))  MRSA PCR Screening     Status: None   Collection Time: 03/30/15  9:27 PM  Result Value Ref Range Status   MRSA by  PCR NEGATIVE NEGATIVE Final    Comment:        The GeneXpert MRSA Assay (FDA approved for NASAL specimens only), is one component of a comprehensive MRSA colonization surveillance program. It is not intended to diagnose MRSA infection nor to guide or monitor treatment for MRSA infections.     Radiology Reports Dg Chest 2 View  03/21/2015   CLINICAL DATA:  Right chest pain for 3 months  EXAM: CHEST  2 VIEW  COMPARISON:  02/25/2015  FINDINGS: The heart size and mediastinal contours are within normal limits. Both lungs are clear. Stable degenerative changes thoracic spine.  IMPRESSION: No active cardiopulmonary disease.   Electronically Signed   By: Lahoma Crocker M.D.   On: 03/21/2015 17:01   Ct Head Wo Contrast  03/21/2015   CLINICAL DATA:  Hallucinations, slurred speech since this morning  EXAM: CT HEAD WITHOUT CONTRAST  TECHNIQUE: Contiguous axial images were obtained from the base of the skull through the vertex without intravenous contrast.  COMPARISON:  02/25/2015  FINDINGS: Calvarium intact. Mild diffuse atrophy. No focal attenuation abnormality to suggest infarct or mass. Mild low attenuation diffusely in the white matter suggests involutional change. No hemorrhage or extra-axial fluid.  IMPRESSION: No acute findings.   Electronically Signed   By: Skipper Cliche M.D.   On: 03/21/2015 18:49   Mr Brain Wo Contrast  03/30/2015   CLINICAL DATA:  Slurred speech. Confusion. End-stage renal disease on dialysis.  EXAM: MRI HEAD WITHOUT CONTRAST  TECHNIQUE: Multiplanar, multiecho pulse sequences of the brain and surrounding structures were obtained without intravenous contrast.  COMPARISON:  Head CT 03/21/2015  FINDINGS: Images are mildly to moderately degraded by motion artifact.  There is no evidence of acute infarct, intracranial hemorrhage, mass, midline shift, or extra-axial fluid collection. Mild cerebral atrophy is within normal limits for age. Small foci of T2 hyperintensity in the subcortical  and deep cerebral white matter are nonspecific but compatible with mild chronic small vessel ischemic disease.  Orbits are unremarkable. No significant inflammatory disease is seen in the paranasal sinuses or mastoid air cells. The distal right vertebral artery appears small. Major intracranial vascular flow voids are grossly preserved.  IMPRESSION: 1. No acute intracranial abnormality. 2. Mild chronic small vessel ischemic disease.   Electronically Signed   By: Logan Bores   On: 03/30/2015 16:33     CBC  Recent Labs Lab 03/30/15 1148 03/31/15 0550  WBC 7.8 7.4  HGB 14.1 13.7  HCT 39.7 40.3  PLT 127* 128*  MCV 88.4 89.4  MCH  31.4 30.4  MCHC 35.5 34.0  RDW 13.6 13.8    Chemistries   Recent Labs Lab 03/30/15 1148 03/31/15 0550  NA 134* 137  K 4.2 3.8  CL 100* 104  CO2 23 21*  GLUCOSE 306* 85  BUN 33* 36*  CREATININE 6.48* 7.36*  CALCIUM 8.9 8.8*  AST 18  --   ALT 19  --   ALKPHOS 50  --   BILITOT 0.6  --    ------------------------------------------------------------------------------------------------------------------ estimated creatinine clearance is 12.1 mL/min (by C-G formula based on Cr of 7.36). ------------------------------------------------------------------------------------------------------------------ No results for input(s): HGBA1C in the last 72 hours. ------------------------------------------------------------------------------------------------------------------ No results for input(s): CHOL, HDL, LDLCALC, TRIG, CHOLHDL, LDLDIRECT in the last 72 hours. ------------------------------------------------------------------------------------------------------------------  Recent Labs  03/30/15 2005  TSH 2.618   ------------------------------------------------------------------------------------------------------------------  Recent Labs  03/30/15 2005  VITAMINB12 836    Coagulation profile No results for input(s): INR, PROTIME in the last 168  hours.  No results for input(s): DDIMER in the last 72 hours.  Cardiac Enzymes No results for input(s): CKMB, TROPONINI, MYOGLOBIN in the last 168 hours.  Invalid input(s): CK ------------------------------------------------------------------------------------------------------------------ Invalid input(s): POCBNP   Time Spent in minutes   35   Janele Lague K M.D on 03/31/2015 at 11:40 AM  Between 7am to 7pm - Pager - [phone]  After 7pm go to [email or web address] - password [facility]  [facility] -  Office  [phone]

## 2015-03-31 NOTE — Progress Notes (Signed)
EEG completed, results pending. 

## 2015-03-31 NOTE — Progress Notes (Signed)
Initial Nutrition Assessment  DOCUMENTATION CODES:   Obesity unspecified  INTERVENTION:   -Continue Nepro Shake po BID, each supplement provides 425 kcal and 19 grams protein  NUTRITION DIAGNOSIS:   Inadequate oral intake related to lethargy/confusion as evidenced by meal completion < 25%.  GOAL:   Patient will meet greater than or equal to 90% of their needs  MONITOR:   PO intake, Supplement acceptance, Labs, Weight trends, Skin, I & O's  REASON FOR ASSESSMENT:   Malnutrition Screening Tool    ASSESSMENT:   64 year old male with history of ESRD, diabetes mellitus, hypertension, hyperlipidemia presents with approximately one-month history of progressive confusion, agitation, and intermittent hallucinations. The patient is awake and alert, but he is unable to provide any significant history secondary to his confusion. Unfortunately, his mother with whom he lives is unable to provide any significant history other than the fact that the patient has been increasingly confused. A family friend in the room states that the patient has had some slurred speech in the past 24 hours. He denies any focal collection or weakness, chest discomfort, shortness breath, headache, vomiting, diarrhea. He has been confused to the point where he has been forgetting to take his insulin and not checking his CBGs it is unclear whether the patient has actually been taking any of his other medications. Interestingly, the patient has still been going to dialysis. Apparently the patient went to dialysis on Friday, 03/28/2015. According to the medical record, the patient was also agitated during his last admission when he was discharged on 02/26/2015. Apparently, the patient had a working diagnosis of a TIA when he presented with slurred speech. The patient refused dialysis and refuses MRI of the brain at that time.  Pt admitted with acute encephalopathy.   Pt out of room at time of visit. Unable to complete  nutrition-focused physical exam at this time.   Noted UBW around 250#. Noted small amount of weight loss within the past month, however, no significant.   Per staff, pt has been confused and refusing treatments (including HD). Psychiatry consulted for suicidal ideation; does not meet criteria for inpatient admission. He was started on antidepressants.  Noted poor meal intake (25%). Pt accepted morning dose of Nepro. Will continue supplement order.    Diet Order:  Diet renal/carb modified with fluid restriction Diet-HS Snack?: Nothing; Room service appropriate?: Yes; Fluid consistency:: Thin  Skin:  Reviewed, no issues  Last BM:  PTA  Height:   Ht Readings from Last 1 Encounters:  03/21/15 5\' 8"  (1.727 m)    Weight:   Wt Readings from Last 1 Encounters:  03/30/15 233 lb (105.688 kg)    Ideal Body Weight:  77.3 kg  BMI:  Body mass index is 35.44 kg/(m^2).  Estimated Nutritional Needs:   Kcal:  2100-2300  Protein:  95-105 grams  Fluid:  per MD  EDUCATION NEEDS:   No education needs identified at this time  Gladies Sofranko A. Jimmye Norman, RD, LDN, CDE Pager: [phone] After hours Pager: [phone]

## 2015-03-31 NOTE — Consult Note (Signed)
NEURO HOSPITALIST CONSULT NOTE   Referring physician: Candiss Norse   Reason for Consult: hallucinations and confusion.   HPI:                                                                                                                                          Kenneth [name] is an 64 y.o. male who presented to the hospital earlier this month for slurred speech and hallucinations. At that time CT head and Stroke work up including Echo and Carotids were obtained and were negative.  He left AMA prior to MRI being done.  Patient states he had these symptoms when he was placed on medication "for his shingles." looking in chart he was placed on Valtrex int he past month.  Since stopping this medications patient feels he is improving.  Currently he is alert and oriented and follows all commands.   Of note he was on Valtrex on admission 8/14 (non-renal dose 1 gram TID) and this has been held--since being held he has improved.   Past Medical History  Diagnosis Date  . Diabetes mellitus without complication   . Renal disorder   . Blood disease     states that he makes too many red blood cells.  . Hypertension     history of, off medication now  . Hyperlipidemia   . GERD (gastroesophageal reflux disease)     history  . Arthritis   . Carpal tunnel syndrome   . ESRD (end stage renal disease)     on Dialysis M, W, F    Past Surgical History  Procedure Laterality Date  . Hernia repair  1995    umbiccal  . Hand surgery Right     tendon and muscle repair after injury  . Av fistula placement Left 09/14/2013    Procedure: ARTERIOVENOUS (AV) Garceno;  Surgeon: Rosetta Posner, MD;  Location: [facility] OR;  Service: Vascular;  Laterality: Left;    Family History  Problem Relation Age of Onset  . Cancer Father   . Diabetes Father   . Heart disease Father     Heart Disease before age 63  . Heart attack Father   . Hypertension Daughter   . Hyperlipidemia Son   .  Glaucoma Mother      Social History:  reports that he has never smoked. He has never used smokeless tobacco. He reports that he does not drink alcohol or use illicit drugs.  Allergies  Allergen Reactions  . Penicillins Other (See Comments)     Family history of allergies and hence he does not use this - states mom's MD told him never to take it.    MEDICATIONS:  Prior to Admission:  Prescriptions prior to admission  Medication Sig Dispense Refill Last Dose  . acetaminophen (TYLENOL) 500 MG tablet Take 1,000 mg by mouth every 8 (eight) hours as needed for mild pain or moderate pain.    unknown  . amLODipine (NORVASC) 2.5 MG tablet Take 1 tablet (2.5 mg total) by mouth daily. 30 tablet 1 03/29/2015 at Unknown time  . aspirin EC 81 MG tablet Take 81 mg by mouth daily.   03/29/2015 at Unknown time  . clopidogrel (PLAVIX) 75 MG tablet Take 1 tablet (75 mg total) by mouth daily. 30 tablet 1 03/29/2015 at Unknown time  . ibuprofen (ADVIL,MOTRIN) 200 MG tablet Take 200 mg by mouth every 6 (six) hours as needed for headache, mild pain or moderate pain.   unknown  . Insulin Detemir (LEVEMIR FLEXTOUCH) 100 UNIT/ML Pen Inject 60 Units into the skin every morning.   03/29/2015 at Unknown time  . lisinopril (PRINIVIL,ZESTRIL) 20 MG tablet Take 20 mg by mouth daily at 12 noon.  5 03/29/2015 at Unknown time  . multivitamin (RENA-VIT) TABS tablet Take 1 tablet by mouth daily.   03/29/2015 at Unknown time  . simvastatin (ZOCOR) 40 MG tablet Take 40 mg by mouth daily at 6 PM.   03/29/2015 at Unknown time  . traMADol (ULTRAM) 50 MG tablet Take 50 mg by mouth every 6 (six) hours as needed for moderate pain or severe pain.   0 03/29/2015 at Unknown time  . valACYclovir (VALTREX) 500 MG tablet Take 100 mg by mouth 3 (three) times daily.   03/29/2015 at Unknown time  . atorvastatin (LIPITOR) 20 MG tablet Take  1 tablet (20 mg total) by mouth daily at 6 PM. (Patient not taking: Reported on 03/21/2015) 30 tablet 1 Not Taking at Unknown time   Scheduled: . amLODipine  10 mg Oral Daily  . aspirin EC  81 mg Oral Daily  . atorvastatin  20 mg Oral q1800  . calcium acetate  1,334 mg Oral TID WC  . clopidogrel  75 mg Oral Daily  . enoxaparin (LOVENOX) injection  30 mg Subcutaneous Q24H  . feeding supplement (NEPRO CARB STEADY)  237 mL Oral BID BM  . FLUoxetine  20 mg Oral Daily  . hydrALAZINE  50 mg Oral 3 times per day  . insulin aspart  0-15 Units Subcutaneous TID WC  . insulin aspart  0-5 Units Subcutaneous QHS  . insulin aspart  10 Units Subcutaneous Once  . insulin detemir  30 Units Subcutaneous QHS  . lisinopril  20 mg Oral Q1200  . multivitamin  1 tablet Oral Daily  . sodium chloride  3 mL Intravenous Q12H     ROS:                                                                                                                                       History obtained from the  patient  General ROS: negative for - chills, fatigue, fever, night sweats, weight gain or weight loss Psychological ROS: negative for - behavioral disorder, hallucinations, memory difficulties, mood swings or suicidal ideation Ophthalmic ROS: negative for - blurry vision, double vision, eye pain or loss of vision ENT ROS: negative for - epistaxis, nasal discharge, oral lesions, sore throat, tinnitus or vertigo Allergy and Immunology ROS: negative for - hives or itchy/watery eyes Hematological and Lymphatic ROS: negative for - bleeding problems, bruising or swollen lymph nodes Endocrine ROS: negative for - galactorrhea, hair pattern changes, polydipsia/polyuria or temperature intolerance Respiratory ROS: negative for - cough, hemoptysis, shortness of breath or wheezing Cardiovascular ROS: negative for - chest pain, dyspnea on exertion, edema or irregular heartbeat Gastrointestinal ROS: negative for - abdominal pain, diarrhea,  hematemesis, nausea/vomiting or stool incontinence Genito-Urinary ROS: negative for - dysuria, hematuria, incontinence or urinary frequency/urgency Musculoskeletal ROS: negative for - joint swelling or muscular weakness Neurological ROS: as noted in HPI Dermatological ROS: negative for rash and skin lesion changes   Blood pressure 160/126, pulse 94, temperature 98.2 F (36.8 C), temperature source Oral, resp. rate 17, weight 105.688 kg (233 lb), SpO2 97 %.   Neurologic Examination:                                                                                                      HEENT-  Normocephalic, no lesions, without obvious abnormality.  Normal external eye and conjunctiva.  Normal TM's bilaterally.  Normal auditory canals and external ears. Normal external nose, mucus membranes and septum.  Normal pharynx. Cardiovascular- S1, S2 normal, pulses palpable throughout   Lungs- chest clear, no wheezing, rales, normal symmetric air entry Abdomen- normal findings: bowel sounds normal Extremities- no edema Lymph-no adenopathy palpable Musculoskeletal-no joint tenderness, deformity or swelling Skin-warm and dry, no hyperpigmentation, vitiligo, or suspicious lesions  Neurological Examination Mental Status: Alert, oriented to date, month, year, president and the fact his symptoms started when taking valtrex.  Speech fluent without evidence of aphasia.  Able to follow 3 step commands without difficulty. Cranial Nerves: II: Discs flat bilaterally; Visual fields grossly normal, pupils equal, round, reactive to light and accommodation III,IV, VI: ptosis not present, extra-ocular motions intact bilaterally V,VII: smile symmetric, facial light touch sensation normal bilaterally VIII: hearing normal bilaterally IX,X: uvula rises symmetrically XI: bilateral shoulder shrug XII: midline tongue extension Motor: Right : Upper extremity   5/5    Left:     Upper extremity   5/5  Lower extremity    5/5     Lower extremity   5/5 Tone and bulk:normal tone throughout; no atrophy noted Sensory: Pinprick and light touch intact throughout, bilaterally Deep Tendon Reflexes: 1+ and symmetric throughout Plantars: Right: downgoing   Left: downgoing Cerebellar: normal finger-to-nose,  and normal heel-to-shin test Gait: not tested for patient safety      Lab Results: Basic Metabolic Panel:  Recent Labs Lab 03/30/15 1148 03/31/15 0550  NA 134* 137  K 4.2 3.8  CL 100* 104  CO2 23 21*  GLUCOSE 306* 85  BUN 33* 36*  CREATININE 6.48* 7.36*  CALCIUM 8.9 8.8*    Liver Function Tests:  Recent Labs Lab 03/30/15 1148  AST 18  ALT 19  ALKPHOS 50  BILITOT 0.6  PROT 6.1*  ALBUMIN 2.8*   No results for input(s): LIPASE, AMYLASE in the last 168 hours.  Recent Labs Lab 03/30/15 2005  AMMONIA 19    CBC:  Recent Labs Lab 03/30/15 1148 03/31/15 0550  WBC 7.8 7.4  HGB 14.1 13.7  HCT 39.7 40.3  MCV 88.4 89.4  PLT 127* 128*    Cardiac Enzymes: No results for input(s): CKTOTAL, CKMB, CKMBINDEX, TROPONINI in the last 168 hours.  Lipid Panel: No results for input(s): CHOL, TRIG, HDL, CHOLHDL, VLDL, LDLCALC in the last 168 hours.  CBG:  Recent Labs Lab 03/30/15 1145 03/30/15 1604 03/30/15 2147 03/31/15 0752 03/31/15 1219  GLUCAP 285* 176* 156* 91 142*    Microbiology: Results for orders placed or performed during the hospital encounter of 03/30/15  MRSA PCR Screening     Status: None   Collection Time: 03/30/15  9:27 PM  Result Value Ref Range Status   MRSA by PCR NEGATIVE NEGATIVE Final    Comment:        The GeneXpert MRSA Assay (FDA approved for NASAL specimens only), is one component of a comprehensive MRSA colonization surveillance program. It is not intended to diagnose MRSA infection nor to guide or monitor treatment for MRSA infections.     Coagulation Studies: No results for input(s): LABPROT, INR in the last 72 hours.  Imaging: Mr  Herby Abraham Contrast  03/30/2015   CLINICAL DATA:  Slurred speech. Confusion. End-stage renal disease on dialysis.  EXAM: MRI HEAD WITHOUT CONTRAST  TECHNIQUE: Multiplanar, multiecho pulse sequences of the brain and surrounding structures were obtained without intravenous contrast.  COMPARISON:  Head CT 03/21/2015  FINDINGS: Images are mildly to moderately degraded by motion artifact.  There is no evidence of acute infarct, intracranial hemorrhage, mass, midline shift, or extra-axial fluid collection. Mild cerebral atrophy is within normal limits for age. Small foci of T2 hyperintensity in the subcortical and deep cerebral white matter are nonspecific but compatible with mild chronic small vessel ischemic disease.  Orbits are unremarkable. No significant inflammatory disease is seen in the paranasal sinuses or mastoid air cells. The distal right vertebral artery appears small. Major intracranial vascular flow voids are grossly preserved.  IMPRESSION: 1. No acute intracranial abnormality. 2. Mild chronic small vessel ischemic disease.   Electronically Signed   By: Logan Bores   On: 03/30/2015 16:33       Assessment and plan per attending neurologist  Etta Quill PA-C [facility] [phone]  03/31/2015, 2:09 PM   Assessment/Plan:  63y/o hx of ESRD on HD, HTN presenting with one month history of progressive confusion and recent onset of unformed visual hallucinations. Patient reports symptom onset coincides with the start of valtrex for shingles. Per review of records he was taking 1000mg  TID of valtrex which is likely too high of a dose for a patient with ESRD. MRI brain imaging reviewed and overall unremarkable. Mental status appears overall intact on exam with minimal confusion noted.   Suspect his presentation is related to neurotoxicity from high dose valtrex.   -agree with holding valtrex -check EEG -if no improvement with discontinuation of valtrex will consider workup for  possible rapid onset dementia -will follow up

## 2015-04-01 LAB — RENAL FUNCTION PANEL
Albumin: 2.8 g/dL — ABNORMAL LOW (ref 3.5–5.0)
Anion gap: 14 (ref 5–15)
BUN: 43 mg/dL — ABNORMAL HIGH (ref 6–20)
CALCIUM: 9.1 mg/dL (ref 8.9–10.3)
CO2: 19 mmol/L — AB (ref 22–32)
CREATININE: 8.34 mg/dL — AB (ref 0.61–1.24)
Chloride: 104 mmol/L (ref 101–111)
GFR calc non Af Amer: 6 mL/min — ABNORMAL LOW (ref >60)
GFR, EST AFRICAN AMERICAN: 7 mL/min — AB (ref >60)
GLUCOSE: 147 mg/dL — AB (ref 65–99)
Phosphorus: 5.3 mg/dL — ABNORMAL HIGH (ref 2.5–4.6)
Potassium: 3.9 mmol/L (ref 3.5–5.1)
SODIUM: 137 mmol/L (ref 135–145)

## 2015-04-01 LAB — CBC
HCT: 41.3 % (ref 39.0–52.0)
Hemoglobin: 14.1 g/dL (ref 13.0–17.0)
MCH: 30.5 pg (ref 26.0–34.0)
MCHC: 34.1 g/dL (ref 30.0–36.0)
MCV: 89.2 fL (ref 78.0–100.0)
PLATELETS: 134 10*3/uL — AB (ref 150–400)
RBC: 4.63 MIL/uL (ref 4.22–5.81)
RDW: 13.9 % (ref 11.5–15.5)
WBC: 8.3 10*3/uL (ref 4.0–10.5)

## 2015-04-01 LAB — GLUCOSE, CAPILLARY
Glucose-Capillary: 126 mg/dL — ABNORMAL HIGH (ref 65–99)
Glucose-Capillary: 88 mg/dL (ref 65–99)
Glucose-Capillary: 242 mg/dL — ABNORMAL HIGH (ref 65–99)

## 2015-04-01 LAB — RPR: RPR Ser Ql: NONREACTIVE

## 2015-04-01 LAB — HIV ANTIBODY (ROUTINE TESTING W REFLEX): HIV Screen 4th Generation wRfx: NONREACTIVE

## 2015-04-01 MED ORDER — PENTAFLUOROPROP-TETRAFLUOROETH EX AERO: 1.0000 "application " | INHALATION_SPRAY | CUTANEOUS | Status: DC | PRN

## 2015-04-01 MED ORDER — SODIUM CHLORIDE 0.9 % IV SOLN: 100.0000 mL | INTRAVENOUS | Status: DC | PRN

## 2015-04-01 MED ORDER — ALTEPLASE 2 MG IJ SOLR
2.0000 mg | Freq: Once | INTRAMUSCULAR | Status: AC | PRN
  Filled 2015-04-01: qty 2

## 2015-04-01 MED ORDER — NEPRO/CARBSTEADY PO LIQD: 237.0000 mL | ORAL | Status: DC | PRN

## 2015-04-01 MED ORDER — HEPARIN SODIUM (PORCINE) 1000 UNIT/ML DIALYSIS
1000.0000 [IU] | INTRAMUSCULAR | Status: DC | PRN
  Filled 2015-04-01: qty 1

## 2015-04-01 MED ORDER — LIDOCAINE HCL (PF) 1 % IJ SOLN
5.0000 mL | INTRAMUSCULAR | Status: DC | PRN
  Filled 2015-04-01: qty 5

## 2015-04-01 MED ORDER — LIDOCAINE-PRILOCAINE 2.5-2.5 % EX CREA
1.0000 "application " | TOPICAL_CREAM | CUTANEOUS | Status: DC | PRN
  Filled 2015-04-01: qty 5

## 2015-04-01 MED ORDER — CALCITRIOL 0.25 MCG PO CAPS: 0.7500 ug | ORAL_CAPSULE | ORAL | Status: DC

## 2015-04-01 MED ORDER — HEPARIN 1000 UNIT/ML FOR PERITONEAL DIALYSIS
2000.0000 [IU] | Freq: Once | INTRAMUSCULAR | Status: AC
  Administered 2015-04-01: 2000 [IU] via INTRAVENOUS_CENTRAL
  Filled 2015-04-01: qty 2

## 2015-04-01 NOTE — Care Management Note (Signed)
Case Management Note  Patient Details  Name: EDELMIRO INNOCENT MRN: [id number] Date of Birth: 03-07-1951  Subjective/Objective:                 Patient from home (apartment) lives with mom and daughter. Cleared by psychiatry after he had been on suicide precautions. Patient confused, probably due to toxicity from Valtrex he took for shingles. Patient should clear Valtrex with HD today. Anticipate discharge to home.   Action/Plan:  Will continue to follow and offer resources and [facility] services as needed. Expected Discharge Date:                  Expected Discharge Plan:  Home/Self Care  In-House Referral:     Discharge planning Services  CM Consult  Post Acute Care Choice:    Choice offered to:     DME Arranged:    DME Agency:     HH Arranged:    HH Agency:     Status of Service:  In process, will continue to follow  Medicare Important Message Given:    Date Medicare IM Given:    Medicare IM give by:    Date Additional Medicare IM Given:    Additional Medicare Important Message give by:     If discussed at [place] of Stay Meetings, dates discussed:    Additional Comments:  Carles Collet, RN 04/01/2015, 12:03 PM

## 2015-04-01 NOTE — Progress Notes (Signed)
Subjective: Patient improved from admission.  No further hallucinations.    Objective: Current vital signs: BP 159/70 mmHg  Pulse 84  Temp(Src) 98.6 F (37 C) (Oral)  Resp 17  Wt 99.9 kg (220 lb 3.8 oz)  SpO2 94% Vital signs in last 24 hours: Temp:  [97.4 F (36.3 C)-99 F (37.2 C)] 98.6 F (37 C) (08/16 9767) Pulse Rate:  [82-97] 84 (08/16 0634) Resp:  [16-22] 17 (08/16 0634) BP: (112-182)/(64-126) 159/70 mmHg (08/16 0634) SpO2:  [94 %-97 %] 94 % (08/16 0634) Weight:  [99.9 kg (220 lb 3.8 oz)-101.1 kg (222 lb 14.2 oz)] 99.9 kg (220 lb 3.8 oz) (08/16 0457)  Intake/Output from previous day: 08/15 0701 - 08/16 0700 In: 720 [P.O.:720] Out: 978  Intake/Output this shift:   Nutritional status: Diet renal/carb modified with fluid restriction Diet-HS Snack?: Nothing; Room service appropriate?: Yes; Fluid consistency:: Thin  Neurologic Exam: Mental Status: Alert.  Not oriented to day of the week but does know the date, year and month.  Knows his dialysis days.  Able to perform some simple calculations.  Speech fluent without evidence of aphasia. Able to follow 3 step commands without difficulty. Cranial Nerves: II: Discs flat bilaterally; Visual fields grossly normal, pupils equal, round, reactive to light and accommodation III,IV, VI: ptosis not present, extra-ocular motions intact bilaterally V,VII: smile symmetric, facial light touch sensation normal bilaterally VIII: hearing normal bilaterally IX,X: uvula rises symmetrically XI: bilateral shoulder shrug XII: midline tongue extension Motor: Right :Upper extremity 5/5Left: Upper extremity 5/5 Lower extremity 5/5Lower extremity 5/5 Tone and bulk:normal tone throughout; no atrophy noted   Lab Results: Basic Metabolic Panel:  Recent Labs Lab 03/30/15 1148 03/31/15 0550 04/01/15 0258  NA 134* 137 137  K 4.2  3.8 3.9  CL 100* 104 104  CO2 23 21* 19*  GLUCOSE 306* 85 147*  BUN 33* 36* 43*  CREATININE 6.48* 7.36* 8.34*  CALCIUM 8.9 8.8* 9.1  PHOS  --   --  5.3*    Liver Function Tests:  Recent Labs Lab 03/30/15 1148 04/01/15 0258  AST 18  --   ALT 19  --   ALKPHOS 50  --   BILITOT 0.6  --   PROT 6.1*  --   ALBUMIN 2.8* 2.8*   No results for input(s): LIPASE, AMYLASE in the last 168 hours.  Recent Labs Lab 03/30/15 2005  AMMONIA 19    CBC:  Recent Labs Lab 03/30/15 1148 03/31/15 0550 04/01/15 0258  WBC 7.8 7.4 8.3  HGB 14.1 13.7 14.1  HCT 39.7 40.3 41.3  MCV 88.4 89.4 89.2  PLT 127* 128* 134*    Cardiac Enzymes: No results for input(s): CKTOTAL, CKMB, CKMBINDEX, TROPONINI in the last 168 hours.  Lipid Panel: No results for input(s): CHOL, TRIG, HDL, CHOLHDL, VLDL, LDLCALC in the last 168 hours.  CBG:  Recent Labs Lab 03/31/15 0752 03/31/15 1219 03/31/15 1642 03/31/15 2108 04/01/15 0802  GLUCAP 91 142* 160* 179* 126*    Microbiology: Results for orders placed or performed during the hospital encounter of 03/30/15  MRSA PCR Screening     Status: None   Collection Time: 03/30/15  9:27 PM  Result Value Ref Range Status   MRSA by PCR NEGATIVE NEGATIVE Final    Comment:        The GeneXpert MRSA Assay (FDA approved for NASAL specimens only), is one component of a comprehensive MRSA colonization surveillance program. It is not intended to diagnose MRSA infection nor to guide or  monitor treatment for MRSA infections.     Coagulation Studies: No results for input(s): LABPROT, INR in the last 72 hours.  Imaging: Mr Herby Abraham Contrast  03/30/2015   CLINICAL DATA:  Slurred speech. Confusion. End-stage renal disease on dialysis.  EXAM: MRI HEAD WITHOUT CONTRAST  TECHNIQUE: Multiplanar, multiecho pulse sequences of the brain and surrounding structures were obtained without intravenous contrast.  COMPARISON:  Head CT 03/21/2015  FINDINGS: Images are  mildly to moderately degraded by motion artifact.  There is no evidence of acute infarct, intracranial hemorrhage, mass, midline shift, or extra-axial fluid collection. Mild cerebral atrophy is within normal limits for age. Small foci of T2 hyperintensity in the subcortical and deep cerebral white matter are nonspecific but compatible with mild chronic small vessel ischemic disease.  Orbits are unremarkable. No significant inflammatory disease is seen in the paranasal sinuses or mastoid air cells. The distal right vertebral artery appears small. Major intracranial vascular flow voids are grossly preserved.  IMPRESSION: 1. No acute intracranial abnormality. 2. Mild chronic small vessel ischemic disease.   Electronically Signed   By: Logan Bores   On: 03/30/2015 16:33    Medications:  I have reviewed the patient's current medications. Scheduled: . amLODipine  10 mg Oral Daily  . aspirin EC  81 mg Oral Daily  . atorvastatin  20 mg Oral q1800  . calcium acetate  1,334 mg Oral TID WC  . clopidogrel  75 mg Oral Daily  . enoxaparin (LOVENOX) injection  30 mg Subcutaneous Q24H  . feeding supplement (NEPRO CARB STEADY)  237 mL Oral BID BM  . FLUoxetine  20 mg Oral QHS  . hydrALAZINE  50 mg Oral 3 times per day  . insulin aspart  0-15 Units Subcutaneous TID WC  . insulin aspart  0-5 Units Subcutaneous QHS  . insulin aspart  10 Units Subcutaneous Once  . insulin detemir  30 Units Subcutaneous QHS  . lisinopril  20 mg Oral Q1200  . multivitamin  1 tablet Oral Daily  . sodium chloride  3 mL Intravenous Q12H    Assessment/Plan: No hallucinations.  Oriented.  EEG unremarkable.    Recommendations: 1.  Will continue to follow with you.     LOS: 2 days   Alexis Goodell, MD [facility] [phone] 04/01/2015  9:20 AM

## 2015-04-01 NOTE — Evaluation (Signed)
Physical Therapy Evaluation Patient Details Name: Kenneth [name] MRN: [id number] DOB: 10/02/1950 Today's Date: 04/01/2015   History of Present Illness  Pt adm with encephalopathy due to medication. PMH- ESRD on HD, HTN, DM  Clinical Impression  Pt doing well with mobility and no further PT needed.  Ready for dc from PT standpoint.      Follow Up Recommendations No PT follow up    Equipment Recommendations  None recommended by PT    Recommendations for Other Services       Precautions / Restrictions        Mobility  Bed Mobility Overal bed mobility: Modified Independent                Transfers Overall transfer level: Modified independent                  Ambulation/Gait Ambulation/Gait assistance: Modified independent (Device/Increase time) Ambulation Distance (Feet): 175 Feet Assistive device: None Gait Pattern/deviations: Step-through pattern;Decreased step length - right;Decreased step length - left   Gait velocity interpretation: Below normal speed for age/gender General Gait Details: guarded gait due to chronic back pain.  Stairs            Wheelchair Mobility    Modified Rankin (Stroke Patients Only)       Balance Overall balance assessment: Needs assistance   Sitting balance-Leahy Scale: Normal       Standing balance-Leahy Scale: Good                               Pertinent Vitals/Pain Pain Assessment: No/denies pain    Home Living Family/patient expects to be discharged to:: Private residence   Available Help at Discharge: Family                  Prior Function Level of Independence: Independent               Hand Dominance        Extremity/Trunk Assessment   Upper Extremity Assessment: Overall WFL for tasks assessed           Lower Extremity Assessment: Overall WFL for tasks assessed         Communication   Communication: No difficulties  Cognition Arousal/Alertness:  Awake/alert Behavior During Therapy: WFL for tasks assessed/performed Overall Cognitive Status: Within Functional Limits for tasks assessed                      General Comments      Exercises        Assessment/Plan    PT Assessment Patent does not need any further PT services  PT Diagnosis Difficulty walking   PT Problem List    PT Treatment Interventions     PT Goals (Current goals can be found in the Care Plan section) Acute Rehab PT Goals PT Goal Formulation: All assessment and education complete, DC therapy    Frequency     Barriers to discharge        Co-evaluation               End of Session   Activity Tolerance: Patient tolerated treatment well Patient left: in bed;with call bell/phone within reach           Time: 1421-1431 PT Time Calculation (min) (ACUTE ONLY): 10 min   Charges:   PT Evaluation $Initial PT Evaluation Tier I: 1 Procedure  PT G Codes:        Alayia Meggison 2015/04/04, 2:43 PM  [facility] PT [phone]

## 2015-04-01 NOTE — Progress Notes (Signed)
Patient Demographics:    Kenneth [name], is a 64 y.o. male, DOB - 1951/03/22, QPY:[id number]  Admit date - 03/30/2015   Admitting Physician Orson Eva, MD  Outpatient Primary MD for the patient is Leonides Grills, MD  LOS - 2   Chief Complaint  Patient presents with  . Altered Mental Status  . Aphasia        Subjective:    [name] today has, No headache, No chest pain, No abdominal pain - No Nausea, No new weakness tingling or numbness, No Cough - SOB.     Assessment  & Plan :    1. Encephalopathy. Question hypertensive versus undiagnosed vascular dementia - he be much improved with adjusted medications, have increased Norvasc and added hydralazine, as needed IV hydralazine, stable ammonia level, TSH and B-12 stable. Unremarkable RPR and HIV. EEG stable. MRI stable.  Neuro on board, per neuro symptoms could have been due to recently started Valtrex which has been held, agitation has slightly improved, removed bedside sitter, monitor. If back to baseline and then this was Valtrex-induced encephalopathy. If in the next few days he continues to be agitated this could be new onset dementia.    2. Hypertension. Meds adjusted for better control, BP better.    3. ESRD. On Monday Wednesday Friday dialysis, renal on board.   4. Dyslipidemia. Continue home dose statin.     5. Reported suicidal ideation in ER. Sitter bedside, psych consulted. Not suicidal cleared by psych, removed bedside sitter.     6. DM type II. Lantus and sliding-scale continue to monitor.   Lab Results  Component Value Date   HGBA1C 11.1* 02/25/2015    CBG (last 3)   Recent Labs  03/31/15 1642 03/31/15 2108 04/01/15 0802  GLUCAP 160* 179* 126*     Code Status : Full  Family Communication  : daughter  in detail over the phone on 03/31/2015  Disposition Plan  : TBD will have PT see.  Consults  :  Renal, Neuro, Psych  Procedures  :   CT and MRI Brain - Non Acute  DVT Prophylaxis  :  Lovenox    Lab Results  Component Value Date   PLT 134* 04/01/2015    Inpatient Medications  Scheduled Meds: . amLODipine  10 mg Oral Daily  . aspirin EC  81 mg Oral Daily  . atorvastatin  20 mg Oral q1800  . calcium acetate  1,334 mg Oral TID WC  . clopidogrel  75 mg Oral Daily  . enoxaparin (LOVENOX) injection  30 mg Subcutaneous Q24H  . feeding supplement (NEPRO CARB STEADY)  237 mL Oral BID BM  . FLUoxetine  20 mg Oral QHS  . hydrALAZINE  50 mg Oral 3 times per day  . insulin aspart  0-15 Units Subcutaneous TID WC  . insulin aspart  0-5 Units Subcutaneous QHS  . insulin aspart  10 Units Subcutaneous Once  . insulin detemir  30 Units Subcutaneous QHS  . lisinopril  20 mg Oral Q1200  . multivitamin  1 tablet Oral Daily  . sodium chloride  3 mL Intravenous Q12H   Continuous Infusions:  PRN Meds:.sodium chloride, sodium chloride, acetaminophen **OR** acetaminophen, alteplase, feeding supplement (NEPRO CARB STEADY), haloperidol lactate, heparin, hydrALAZINE,  lidocaine (PF), lidocaine-prilocaine, ondansetron **OR** ondansetron (ZOFRAN) IV, pentafluoroprop-tetrafluoroeth  Antibiotics  :     Anti-infectives    None        Objective:   Filed Vitals:   04/01/15 0457 04/01/15 0520 04/01/15 0634 04/01/15 0800  BP: 156/87 139/78 159/70   Pulse: 96 94 84   Temp: 97.4 F (36.3 C)  98.6 F (37 C)   TempSrc: Oral  Oral   Resp: 18  17   Height:    5\' 8"  (1.727 m)  Weight: 99.9 kg (220 lb 3.8 oz)   98.793 kg (217 lb 12.8 oz)  SpO2: 97%  94%     Wt Readings from Last 3 Encounters:  04/01/15 98.793 kg (217 lb 12.8 oz)  03/21/15 108.863 kg (240 lb)  02/25/15 108.863 kg (240 lb)     Intake/Output Summary (Last 24 hours) at 04/01/15 1015 Last data filed at 04/01/15 0457  Gross per 24  hour  Intake    480 ml  Output    978 ml  Net   -498 ml     Physical Exam  Awake Alert, Oriented X 3, agitation seems to have improved, No new F.N deficits, Normal affect [place].AT,PERRAL Supple Neck,No JVD, No cervical lymphadenopathy appriciated.  Symmetrical Chest wall movement, Good air movement bilaterally, CTAB RRR,No Gallops,Rubs or new Murmurs, No Parasternal Heave +ve B.Sounds, Abd Soft, No tenderness, No organomegaly appriciated, No rebound - guarding or rigidity. No Cyanosis, Clubbing or edema, No new Rash or bruise       Data Review:   Micro Results Recent Results (from the past 240 hour(s))  MRSA PCR Screening     Status: None   Collection Time: 03/30/15  9:27 PM  Result Value Ref Range Status   MRSA by PCR NEGATIVE NEGATIVE Final    Comment:        The GeneXpert MRSA Assay (FDA approved for NASAL specimens only), is one component of a comprehensive MRSA colonization surveillance program. It is not intended to diagnose MRSA infection nor to guide or monitor treatment for MRSA infections.     Radiology Reports Dg Chest 2 View  03/21/2015   CLINICAL DATA:  Right chest pain for 3 months  EXAM: CHEST  2 VIEW  COMPARISON:  02/25/2015  FINDINGS: The heart size and mediastinal contours are within normal limits. Both lungs are clear. Stable degenerative changes thoracic spine.  IMPRESSION: No active cardiopulmonary disease.   Electronically Signed   By: Lahoma Crocker M.D.   On: 03/21/2015 17:01   Ct Head Wo Contrast  03/21/2015   CLINICAL DATA:  Hallucinations, slurred speech since this morning  EXAM: CT HEAD WITHOUT CONTRAST  TECHNIQUE: Contiguous axial images were obtained from the base of the skull through the vertex without intravenous contrast.  COMPARISON:  02/25/2015  FINDINGS: Calvarium intact. Mild diffuse atrophy. No focal attenuation abnormality to suggest infarct or mass. Mild low attenuation diffusely in the white matter suggests involutional change. No hemorrhage  or extra-axial fluid.  IMPRESSION: No acute findings.   Electronically Signed   By: Skipper Cliche M.D.   On: 03/21/2015 18:49   Mr Brain Wo Contrast  03/30/2015   CLINICAL DATA:  Slurred speech. Confusion. End-stage renal disease on dialysis.  EXAM: MRI HEAD WITHOUT CONTRAST  TECHNIQUE: Multiplanar, multiecho pulse sequences of the brain and surrounding structures were obtained without intravenous contrast.  COMPARISON:  Head CT 03/21/2015  FINDINGS: Images are mildly to moderately degraded by motion artifact.  There is no  evidence of acute infarct, intracranial hemorrhage, mass, midline shift, or extra-axial fluid collection. Mild cerebral atrophy is within normal limits for age. Small foci of T2 hyperintensity in the subcortical and deep cerebral white matter are nonspecific but compatible with mild chronic small vessel ischemic disease.  Orbits are unremarkable. No significant inflammatory disease is seen in the paranasal sinuses or mastoid air cells. The distal right vertebral artery appears small. Major intracranial vascular flow voids are grossly preserved.  IMPRESSION: 1. No acute intracranial abnormality. 2. Mild chronic small vessel ischemic disease.   Electronically Signed   By: Logan Bores   On: 03/30/2015 16:33     CBC  Recent Labs Lab 03/30/15 1148 03/31/15 0550 04/01/15 0258  WBC 7.8 7.4 8.3  HGB 14.1 13.7 14.1  HCT 39.7 40.3 41.3  PLT 127* 128* 134*  MCV 88.4 89.4 89.2  MCH 31.4 30.4 30.5  MCHC 35.5 34.0 34.1  RDW 13.6 13.8 13.9    Chemistries   Recent Labs Lab 03/30/15 1148 03/31/15 0550 04/01/15 0258  NA 134* 137 137  K 4.2 3.8 3.9  CL 100* 104 104  CO2 23 21* 19*  GLUCOSE 306* 85 147*  BUN 33* 36* 43*  CREATININE 6.48* 7.36* 8.34*  CALCIUM 8.9 8.8* 9.1  AST 18  --   --   ALT 19  --   --   ALKPHOS 50  --   --   BILITOT 0.6  --   --     ------------------------------------------------------------------------------------------------------------------ estimated creatinine clearance is 10.3 mL/min (by C-G formula based on Cr of 8.34). ------------------------------------------------------------------------------------------------------------------ No results for input(s): HGBA1C in the last 72 hours. ------------------------------------------------------------------------------------------------------------------ No results for input(s): CHOL, HDL, LDLCALC, TRIG, CHOLHDL, LDLDIRECT in the last 72 hours. ------------------------------------------------------------------------------------------------------------------  Recent Labs  03/30/15 2005  TSH 2.618   ------------------------------------------------------------------------------------------------------------------  Recent Labs  03/30/15 2005  VITAMINB12 836    Coagulation profile No results for input(s): INR, PROTIME in the last 168 hours.  No results for input(s): DDIMER in the last 72 hours.  Cardiac Enzymes No results for input(s): CKMB, TROPONINI, MYOGLOBIN in the last 168 hours.  Invalid input(s): CK ------------------------------------------------------------------------------------------------------------------ Invalid input(s): POCBNP   Time Spent in minutes   35   Lala Lund K M.D on 04/01/2015 at 10:15 AM  Between 7am to 7pm - Pager - [phone]  After 7pm go to [email or web address] - password [facility]  [facility] -  Office  [phone]

## 2015-04-01 NOTE — Clinical Social Work Psych Note (Signed)
Patient has been set up with [facility] in [place] (patient lives in [place] and [place] lives in [place]).  Appointment set for September 23 8:00am.  This information has been placed on the discharge summary for patient convenience.  Nonnie Done, LCSW [phone]  Psychiatric & Orthopedics (5N 1-8) Clinical Social Worker

## 2015-04-01 NOTE — Progress Notes (Signed)
Subjective:   Tolerated HD yest. On schedule, no cos, appears back to normal MS off Valtrex Objective Vital signs in last 24 hours: Filed Vitals:   04/01/15 0457 04/01/15 0520 04/01/15 0634 04/01/15 0800  BP: 156/87 139/78 159/70   Pulse: 96 94 84   Temp: 97.4 F (36.3 C)  98.6 F (37 C)   TempSrc: Oral  Oral   Resp: 18  17   Height:    5\' 8"  (1.727 m)  Weight: 99.9 kg (220 lb 3.8 oz)   98.793 kg (217 lb 12.8 oz)  SpO2: 97%  94%    Weight change: -4.588 kg (-10 lb 1.8 oz)  Physical Exam: General:NAD / alert WM / Appearing back to baseline MS/Daughter and granddaughter in room  Heart: RRR,  no rub or mur. Lungs: CTA , non labored breathing  Abdomen: BS pos , obese, soft , NT ND Extremities: no pedal edema Dialysis Acces= pos bruit LAVF    Op  HD Orders: Center: [facility] on MWF . 200 dialyzer  EDW 110 KG but Last 3 tx leaving below 106.4, 106.8 , 108.1 And stable bp HD Bath 2k, 2 ca Time 4hrs 55min Heparin 10,000 initial then 3000 bolius. Access LUA AVF  PO Calcitriol 0.7mcg/HD  Other op labs hgb 13.2, ca 9.1 phos 6.4 pth 400   Problem/Plan: 1. Encephalopathy = Probably  secondary to med effect Valtrex ( nonrenal dose)   appears back to baseline MS this am off Valtrex / neuro. And  Admit team wu 2. ESRD - MWF HD ,k 3.9  HD yest.    3. Hypertension/volume - 159/70 bp   Now On bp meds Amlodipine/ Lisinopril / and hydralazine added.  Below op edw sec to decr po .new edw~~100kg with clothes  4. Anemia - hgb 14.1 No esa  5. Metabolic bone disease - binders and Vit d per [facility] hx 6. Nutrition - renal / carb mod  7. DM Type 2- per Admit 8. Thrombocytopenia - Chronic   Ernest Haber, PA-C [facility] [phone] 04/01/2015,9:59 AM  LOS: 2 days   Labs: Basic Metabolic Panel:  Recent Labs Lab 03/30/15 1148 03/31/15 0550 04/01/15 0258  NA 134* 137 137  K 4.2 3.8 3.9  CL 100* 104 104  CO2 23 21* 19*  GLUCOSE 306* 85 147*   BUN 33* 36* 43*  CREATININE 6.48* 7.36* 8.34*  CALCIUM 8.9 8.8* 9.1  PHOS  --   --  5.3*   Liver Function Tests:  Recent Labs Lab 03/30/15 1148 04/01/15 0258  AST 18  --   ALT 19  --   ALKPHOS 50  --   BILITOT 0.6  --   PROT 6.1*  --   ALBUMIN 2.8* 2.8*   No results for input(s): LIPASE, AMYLASE in the last 168 hours.  Recent Labs Lab 03/30/15 2005  AMMONIA 19   CBC:  Recent Labs Lab 03/30/15 1148 03/31/15 0550 04/01/15 0258  WBC 7.8 7.4 8.3  HGB 14.1 13.7 14.1  HCT 39.7 40.3 41.3  MCV 88.4 89.4 89.2  PLT 127* 128* 134*   CBG:  Recent Labs Lab 03/31/15 0752 03/31/15 1219 03/31/15 1642 03/31/15 2108 04/01/15 0802  GLUCAP 91 142* 160* 179* 126*    Studies/Results: Mr Brain Wo Contrast  03/30/2015   CLINICAL DATA:  Slurred speech. Confusion. End-stage renal disease on dialysis.  EXAM: MRI HEAD WITHOUT CONTRAST  TECHNIQUE: Multiplanar, multiecho pulse sequences of the brain and surrounding structures were obtained without  intravenous contrast.  COMPARISON:  Head CT 03/21/2015  FINDINGS: Images are mildly to moderately degraded by motion artifact.  There is no evidence of acute infarct, intracranial hemorrhage, mass, midline shift, or extra-axial fluid collection. Mild cerebral atrophy is within normal limits for age. Small foci of T2 hyperintensity in the subcortical and deep cerebral white matter are nonspecific but compatible with mild chronic small vessel ischemic disease.  Orbits are unremarkable. No significant inflammatory disease is seen in the paranasal sinuses or mastoid air cells. The distal right vertebral artery appears small. Major intracranial vascular flow voids are grossly preserved.  IMPRESSION: 1. No acute intracranial abnormality. 2. Mild chronic small vessel ischemic disease.   Electronically Signed   By: Logan Bores   On: 03/30/2015 16:33   Medications:   . amLODipine  10 mg Oral Daily  . aspirin EC  81 mg Oral Daily  . atorvastatin  20 mg  Oral q1800  . calcium acetate  1,334 mg Oral TID WC  . clopidogrel  75 mg Oral Daily  . enoxaparin (LOVENOX) injection  30 mg Subcutaneous Q24H  . feeding supplement (NEPRO CARB STEADY)  237 mL Oral BID BM  . FLUoxetine  20 mg Oral QHS  . hydrALAZINE  50 mg Oral 3 times per day  . insulin aspart  0-15 Units Subcutaneous TID WC  . insulin aspart  0-5 Units Subcutaneous QHS  . insulin aspart  10 Units Subcutaneous Once  . insulin detemir  30 Units Subcutaneous QHS  . lisinopril  20 mg Oral Q1200  . multivitamin  1 tablet Oral Daily  . sodium chloride  3 mL Intravenous Q12H

## 2015-04-02 DIAGNOSIS — F4323 Adjustment disorder with mixed anxiety and depressed mood: Secondary | ICD-10-CM

## 2015-04-02 LAB — GLUCOSE, CAPILLARY
GLUCOSE-CAPILLARY: 130 mg/dL — AB (ref 65–99)
Glucose-Capillary: 132 mg/dL — ABNORMAL HIGH (ref 65–99)

## 2015-04-02 MED ORDER — INSULIN DETEMIR 100 UNIT/ML FLEXPEN: 30.0000 [IU] | PEN_INJECTOR | Freq: Every morning | SUBCUTANEOUS | Status: DC

## 2015-04-02 MED ORDER — CALCITRIOL 0.25 MCG PO CAPS: 0.7500 ug | ORAL_CAPSULE | ORAL | Status: DC

## 2015-04-02 MED ORDER — CALCIUM ACETATE (PHOS BINDER) 667 MG PO CAPS: 1334.0000 mg | ORAL_CAPSULE | Freq: Three times a day (TID) | ORAL | Status: DC

## 2015-04-02 MED ORDER — INSULIN DETEMIR 100 UNIT/ML FLEXPEN: 20.0000 [IU] | PEN_INJECTOR | Freq: Every morning | SUBCUTANEOUS | Status: DC

## 2015-04-02 MED ORDER — HYDRALAZINE HCL 50 MG PO TABS: 50.0000 mg | ORAL_TABLET | Freq: Three times a day (TID) | ORAL | Status: DC

## 2015-04-02 MED ORDER — FLUOXETINE HCL 20 MG PO CAPS: 20.0000 mg | ORAL_CAPSULE | Freq: Every day | ORAL | Status: DC

## 2015-04-02 NOTE — Progress Notes (Signed)
Patient refusing dialysis today, states "I'm supposed to go home today and I'll go to my place for dialysis". Called [facility] in HD and notified of patient refusal. Will pass on to dayshift nurse.

## 2015-04-02 NOTE — Discharge Instructions (Signed)
Follow with Primary MD Leonides Grills, MD in 7 days   Get CBC, CMP, 2 view Chest X ray checked  by Primary MD next visit.    Activity: As tolerated with Full fall precautions use walker/cane & assistance as needed   Disposition Home    Diet: Heart Healthy ,renal dialysis diet. , with feeding assistance and aspiration precautions.  For Heart failure patients - Check your Weight same time everyday, if you gain over 2 pounds, or you develop in leg swelling, experience more shortness of breath or chest pain, call your Primary MD immediately. Follow Cardiac Low Salt Diet and 1.5 lit/day fluid restriction.   On your next visit with your primary care physician please Get Medicines reviewed and adjusted.   Please request your Prim.MD to go over all Hospital Tests and Procedure/Radiological results at the follow up, please get all Hospital records sent to your Prim MD by signing hospital release before you go home.   If you experience worsening of your admission symptoms, develop shortness of breath, life threatening emergency, suicidal or homicidal thoughts you must seek medical attention immediately by calling 911 or calling your MD immediately  if symptoms less severe.  You Must read complete instructions/literature along with all the possible adverse reactions/side effects for all the Medicines you take and that have been prescribed to you. Take any new Medicines after you have completely understood and accpet all the possible adverse reactions/side effects.   Do not drive, operating heavy machinery, perform activities at heights, swimming or participation in water activities or provide baby sitting services if your were admitted for syncope or siezures until you have seen by Primary MD or a Neurologist and advised to do so again.  Do not drive when taking Pain medications.    Do not take more than prescribed Pain, Sleep and Anxiety Medications  Special Instructions: If you have smoked  or chewed Tobacco  in the last 2 yrs please stop smoking, stop any regular Alcohol  and or any Recreational drug use.  Wear Seat belts while driving.   Please note  You were cared for by a hospitalist during your hospital stay. If you have any questions about your discharge medications or the care you received while you were in the hospital after you are discharged, you can call the unit and asked to speak with the hospitalist on call if the hospitalist that took care of you is not available. Once you are discharged, your primary care physician will handle any further medical issues. Please note that NO REFILLS for any discharge medications will be authorized once you are discharged, as it is imperative that you return to your primary care physician (or establish a relationship with a primary care physician if you do not have one) for your aftercare needs so that they can reassess your need for medications and monitor your lab values.

## 2015-04-02 NOTE — Discharge Summary (Signed)
Kenneth [name], is a 64 y.o. male  DOB 01/13/1951  MRN [id number].  Admission date:  03/30/2015  Admitting Physician  Orson Eva, MD  Discharge Date:  04/02/2015   Primary MD  Leonides Grills, MD  Recommendations for primary care physician for things to follow:  - Continue hemodialysis, and routine labs checked during dialysis.   Admission Diagnosis  Confusion [R41.0] Hyperglycemia [R73.9] ESRD on dialysis [N18.6, Z99.2] Medication adverse effect, initial encounter [T88.7XXA] Valtrex overdose, accidental or unintentional, initial encounter [T37.5X1A] Altered mental status, unspecified altered mental status type [R41.82]   Discharge Diagnosis  Confusion [R41.0] Hyperglycemia [R73.9] ESRD on dialysis [N18.6, Z99.2] Medication adverse effect, initial encounter [T88.7XXA] Valtrex overdose, accidental or unintentional, initial encounter [T37.5X1A] Altered mental status, unspecified altered mental status type [R41.82]    Principal Problem:   Adjustment disorder with mixed anxiety and depressed mood Active Problems:   ESRD (end stage renal disease)   Acute encephalopathy   Type 2 diabetes mellitus with renal complications      Past Medical History  Diagnosis Date  . Diabetes mellitus without complication   . Renal disorder   . Blood disease     states that he makes too many red blood cells.  . Hypertension     history of, off medication now  . Hyperlipidemia   . GERD (gastroesophageal reflux disease)     history  . Arthritis   . Carpal tunnel syndrome   . ESRD (end stage renal disease)     on Dialysis M, W, F    Past Surgical History  Procedure Laterality Date  . Hernia repair  1995    umbiccal  . Hand surgery Right     tendon and muscle repair after injury  . Av fistula placement Left 09/14/2013    Procedure: ARTERIOVENOUS (AV) [place];  Surgeon: Rosetta Posner,  MD;  Location: [facility];  Service: Vascular;  Laterality: Left;       History of present illness and  Hospital Course:     Kindly see H&P for history of present illness and admission details, please review complete Labs, Consult reports and Test reports for all details in brief  HPI  from the history and physical done on the day of admission 03/30/2015 64 year old male with history of ESRD, diabetes mellitus, hypertension, hyperlipidemia presents with approximately one-month history of progressive confusion, agitation, and intermittent hallucinations. The patient is awake and alert, but he is unable to provide any significant history secondary to his confusion. Unfortunately, his mother with whom he lives is unable to provide any significant history other than the fact that the patient has been increasingly confused. A family friend in the room states that the patient has had some slurred speech in the past 24 hours. He denies any focal collection or weakness, chest discomfort, shortness breath, headache, vomiting, diarrhea. He has been confused to the point where he has been forgetting to take his insulin and not checking his CBGs it is unclear whether the patient has  actually been taking any of his other medications. Interestingly, the patient has still been going to dialysis. Apparently the patient went to dialysis on Friday, 03/28/2015. According to the medical record, the patient was also agitated during his last admission when he was discharged on 02/26/2015. Apparently, the patient had a working diagnosis of a TIA when he presented with slurred speech. The patient refused dialysis and refuses MRI of the brain at that time.  In the emergency department, the patient was noted to have essentially unremarkable BMP except for serum creatinine 6.48. Serum glucose was 306. The patient was mildly, cytopenic with bolus of 127,000. WBC was 7.8, hemoglobin 14.1. EKG shows sinus rhythm without any ST-T wave  changes. The patient was afebrile and hemodynamically stable with oxygen saturation 100% on room air.   Hospital Course   1. Encephalopathy. - Resolved, back to baseline,  per neuro symptoms could have been due to recently started Valtrex , symptoms totally resolved after stopping the Valtrex. - Question hypertensive versus undiagnosed vascular dementia - he is much improved with adjusted medications,   added hydralazine, stable ammonia level, TSH and B-12 stable. Unremarkable RPR and HIV. EEG stable. MRI stable.     2. Hypertension. Meds adjusted for better control, BP better.    3. ESRD. On Monday Wednesday Friday dialysis, renal on board.   4. Dyslipidemia. Continue home dose statin.     5. Reported suicidal ideation in ER. Sitter bedside, psych consulted. Not suicidal cleared by psych, removed bedside sitter.     6. DM type II. Lowered Lantus , CBGs has been controlled on 30 UNITS SUBCUTANEOUS DAILY.     Discharge Condition: stable   Follow UP  Follow-up Information    Follow up with [facility]. Go on 05/09/2015.   Why:  8:00am, Outpatient psychiatric follow-up, medication managment   Contact information:   [address]  [place] [address] [phone]      Follow up with Leonides Grills, MD. Call in 1 week.   Specialty:  Family Medicine   Why:  post hospital stay follow up.   Contact information:   [address] [place] [id number] [phone]         Discharge Instructions  and  Discharge Medications     Discharge Instructions    Discharge instructions    Complete by:  As directed   Follow with Primary MD Leonides Grills, MD in 7 days   Get CBC, CMP, 2 view Chest X ray checked  by Primary MD next visit.    Activity: As tolerated with Full fall precautions use walker/cane & assistance as needed   Disposition Home    Diet: Heart Healthy ,renal dialysis diet. , with feeding  assistance and aspiration precautions.  For Heart failure patients - Check your Weight same time everyday, if you gain over 2 pounds, or you develop in leg swelling, experience more shortness of breath or chest pain, call your Primary MD immediately. Follow Cardiac Low Salt Diet and 1.5 lit/day fluid restriction.   On your next visit with your primary care physician please Get Medicines reviewed and adjusted.   Please request your Prim.MD to go over all Hospital Tests and Procedure/Radiological results at the follow up, please get all Hospital records sent to your Prim MD by signing hospital release before you go home.   If you experience worsening of your admission symptoms, develop shortness of breath, life threatening emergency, suicidal or homicidal thoughts you  must seek medical attention immediately by calling 911 or calling your MD immediately  if symptoms less severe.  You Must read complete instructions/literature along with all the possible adverse reactions/side effects for all the Medicines you take and that have been prescribed to you. Take any new Medicines after you have completely understood and accpet all the possible adverse reactions/side effects.   Do not drive, operating heavy machinery, perform activities at heights, swimming or participation in water activities or provide baby sitting services if your were admitted for syncope or siezures until you have seen by Primary MD or a Neurologist and advised to do so again.  Do not drive when taking Pain medications.    Do not take more than prescribed Pain, Sleep and Anxiety Medications  Special Instructions: If you have smoked or chewed Tobacco  in the last 2 yrs please stop smoking, stop any regular Alcohol  and or any Recreational drug use.  Wear Seat belts while driving.   Please note  You were cared for by a hospitalist during your hospital stay. If you have any questions about your discharge medications or the care  you received while you were in the hospital after you are discharged, you can call the unit and asked to speak with the hospitalist on call if the hospitalist that took care of you is not available. Once you are discharged, your primary care physician will handle any further medical issues. Please note that NO REFILLS for any discharge medications will be authorized once you are discharged, as it is imperative that you return to your primary care physician (or establish a relationship with a primary care physician if you do not have one) for your aftercare needs so that they can reassess your need for medications and monitor your lab values.     Increase activity slowly    Complete by:  As directed             Medication List    STOP taking these medications        atorvastatin 20 MG tablet  Commonly known as:  LIPITOR     ibuprofen 200 MG tablet  Commonly known as:  ADVIL,MOTRIN     traMADol 50 MG tablet  Commonly known as:  ULTRAM     valACYclovir 500 MG tablet  Commonly known as:  VALTREX      TAKE these medications        acetaminophen 500 MG tablet  Commonly known as:  TYLENOL  Take 1,000 mg by mouth every 8 (eight) hours as needed for mild pain or moderate pain.     amLODipine 2.5 MG tablet  Commonly known as:  NORVASC  Take 1 tablet (2.5 mg total) by mouth daily.     aspirin EC 81 MG tablet  Take 81 mg by mouth daily.     calcitRIOL 0.25 MCG capsule  Commonly known as:  ROCALTROL  Take 3 capsules (0.75 mcg total) by mouth every Monday, Wednesday, and Friday with hemodialysis.     calcium acetate 667 MG capsule  Commonly known as:  PHOSLO  Take 2 capsules (1,334 mg total) by mouth 3 (three) times daily with meals.     clopidogrel 75 MG tablet  Commonly known as:  PLAVIX  Take 1 tablet (75 mg total) by mouth daily.     FLUoxetine 20 MG capsule  Commonly known as:  PROZAC  Take 1 capsule (20 mg total) by mouth at bedtime.     hydrALAZINE  50 MG tablet    Commonly known as:  APRESOLINE  Take 1 tablet (50 mg total) by mouth every 8 (eight) hours.     Insulin Detemir 100 UNIT/ML Pen  Commonly known as:  LEVEMIR FLEXTOUCH  Inject 30 Units into the skin every morning.     lisinopril 20 MG tablet  Commonly known as:  PRINIVIL,ZESTRIL  Take 20 mg by mouth daily at 12 noon.     multivitamin Tabs tablet  Take 1 tablet by mouth daily.     simvastatin 40 MG tablet  Commonly known as:  ZOCOR  Take 40 mg by mouth daily at 6 PM.          Diet and Activity recommendation: See Discharge Instructions above   Consults obtained -  Neuro Nephro Psych  Major procedures and Radiology Reports - PLEASE review detailed and final reports for all details, in brief -      Dg Chest 2 View  03/21/2015   CLINICAL DATA:  Right chest pain for 3 months  EXAM: CHEST  2 VIEW  COMPARISON:  02/25/2015  FINDINGS: The heart size and mediastinal contours are within normal limits. Both lungs are clear. Stable degenerative changes thoracic spine.  IMPRESSION: No active cardiopulmonary disease.   Electronically Signed   By: Lahoma Crocker M.D.   On: 03/21/2015 17:01   Ct Head Wo Contrast  03/21/2015   CLINICAL DATA:  Hallucinations, slurred speech since this morning  EXAM: CT HEAD WITHOUT CONTRAST  TECHNIQUE: Contiguous axial images were obtained from the base of the skull through the vertex without intravenous contrast.  COMPARISON:  02/25/2015  FINDINGS: Calvarium intact. Mild diffuse atrophy. No focal attenuation abnormality to suggest infarct or mass. Mild low attenuation diffusely in the white matter suggests involutional change. No hemorrhage or extra-axial fluid.  IMPRESSION: No acute findings.   Electronically Signed   By: Skipper Cliche M.D.   On: 03/21/2015 18:49   Mr Brain Wo Contrast  03/30/2015   CLINICAL DATA:  Slurred speech. Confusion. End-stage renal disease on dialysis.  EXAM: MRI HEAD WITHOUT CONTRAST  TECHNIQUE: Multiplanar, multiecho pulse sequences  of the brain and surrounding structures were obtained without intravenous contrast.  COMPARISON:  Head CT 03/21/2015  FINDINGS: Images are mildly to moderately degraded by motion artifact.  There is no evidence of acute infarct, intracranial hemorrhage, mass, midline shift, or extra-axial fluid collection. Mild cerebral atrophy is within normal limits for age. Small foci of T2 hyperintensity in the subcortical and deep cerebral white matter are nonspecific but compatible with mild chronic small vessel ischemic disease.  Orbits are unremarkable. No significant inflammatory disease is seen in the paranasal sinuses or mastoid air cells. The distal right vertebral artery appears small. Major intracranial vascular flow voids are grossly preserved.  IMPRESSION: 1. No acute intracranial abnormality. 2. Mild chronic small vessel ischemic disease.   Electronically Signed   By: Logan Bores   On: 03/30/2015 16:33    Micro Results     Recent Results (from the past 240 hour(s))  MRSA PCR Screening     Status: None   Collection Time: 03/30/15  9:27 PM  Result Value Ref Range Status   MRSA by PCR NEGATIVE NEGATIVE Final    Comment:        The GeneXpert MRSA Assay (FDA approved for NASAL specimens only), is one component of a comprehensive MRSA colonization surveillance program. It is not intended to diagnose MRSA infection nor to guide or monitor treatment for  MRSA infections.        Today   Subjective:   Kenneth [name] today has no headache,no chest abdominal pain,no new weakness tingling or numbness, feels much better wants to go home today.   Objective:   Blood pressure 113/42, pulse 66, temperature 97.8 F (36.6 C), temperature source Oral, resp. rate 20, height 5\' 8"  (1.727 m), weight 98.793 kg (217 lb 12.8 oz), SpO2 99 %.   Intake/Output Summary (Last 24 hours) at 04/02/15 1032 Last data filed at 04/01/15 1828  Gross per 24 hour  Intake    320 ml  Output    200 ml  Net    120 ml      Exam Awake Alert, Oriented x 3, No new F.N deficits, Normal affect .AT,PERRAL Supple Neck,No JVD, No cervical lymphadenopathy appriciated.  Symmetrical Chest wall movement, Good air movement bilaterally, CTAB RRR,No Gallops,Rubs or new Murmurs, No Parasternal Heave +ve B.Sounds, Abd Soft, Non tender, No organomegaly appriciated, No rebound -guarding or rigidity. No Cyanosis, Clubbing or edema, No new Rash or bruise  Data Review   CBC w Diff: Lab Results  Component Value Date   WBC 8.3 04/01/2015   HGB 14.1 04/01/2015   HCT 41.3 04/01/2015   PLT 134* 04/01/2015   LYMPHOPCT 21 03/21/2015   MONOPCT 6 03/21/2015   EOSPCT 2 03/21/2015   BASOPCT 1 03/21/2015    CMP: Lab Results  Component Value Date   NA 137 04/01/2015   K 3.9 04/01/2015   CL 104 04/01/2015   CO2 19* 04/01/2015   BUN 43* 04/01/2015   CREATININE 8.34* 04/01/2015   PROT 6.1* 03/30/2015   ALBUMIN 2.8* 04/01/2015   BILITOT 0.6 03/30/2015   ALKPHOS 50 03/30/2015   AST 18 03/30/2015   ALT 19 03/30/2015  .   Total Time in preparing paper work, data evaluation and todays exam - 35 minutes  Jessicalynn Deshong M.D on 04/02/2015 at 10:32 AM  [facility]   Office  [phone]

## 2015-04-02 NOTE — Progress Notes (Signed)
Subjective: Patient much improved today.  Alert.  OOB.  Oriented.  No hallucinations.  Objective: Current vital signs: BP 113/42 mmHg  Pulse 66  Temp(Src) 97.8 F (36.6 C) (Oral)  Resp 20  Ht 5\' 8"  (1.727 m)  Wt 98.793 kg (217 lb 12.8 oz)  BMI 33.12 kg/m2  SpO2 99% Vital signs in last 24 hours: Temp:  [97.8 F (36.6 C)-98.2 F (36.8 C)] 97.8 F (36.6 C) (08/17 0604) Pulse Rate:  [66-96] 66 (08/17 0604) Resp:  [20] 20 (08/17 0604) BP: (98-113)/(42-58) 113/42 mmHg (08/17 0604) SpO2:  [95 %-99 %] 99 % (08/17 0604)  Intake/Output from previous day: 08/16 0701 - 08/17 0700 In: 320 [P.O.:320] Out: 200 [Urine:200] Intake/Output this shift:   Nutritional status: Diet renal/carb modified with fluid restriction Diet-HS Snack?: Nothing; Room service appropriate?: Yes; Fluid consistency:: Thin  Neurologic Exam: Mental Status: Alert. Oriented X 4. Able to perform some simple calculations. Speech fluent without evidence of aphasia. Able to follow 3 step commands without difficulty. Cranial Nerves: II: Discs flat bilaterally; Visual fields grossly normal, pupils equal, round, reactive to light and accommodation III,IV, VI: ptosis not present, extra-ocular motions intact bilaterally V,VII: smile symmetric, facial light touch sensation normal bilaterally VIII: hearing normal bilaterally IX,X: uvula rises symmetrically XI: bilateral shoulder shrug XII: midline tongue extension Motor: Right :Upper extremity 5/5Left: Upper extremity 5/5 Lower extremity 5/5Lower extremity 5/5 Tone and bulk:normal tone throughout; no atrophy noted Gait: Normal  Lab Results: Basic Metabolic Panel:  Recent Labs Lab 03/30/15 1148 03/31/15 0550 04/01/15 0258  NA 134* 137 137  K 4.2 3.8 3.9  CL 100* 104 104  CO2 23 21* 19*  GLUCOSE 306* 85 147*  BUN 33* 36* 43*  CREATININE 6.48*  7.36* 8.34*  CALCIUM 8.9 8.8* 9.1  PHOS  --   --  5.3*    Liver Function Tests:  Recent Labs Lab 03/30/15 1148 04/01/15 0258  AST 18  --   ALT 19  --   ALKPHOS 50  --   BILITOT 0.6  --   PROT 6.1*  --   ALBUMIN 2.8* 2.8*   No results for input(s): LIPASE, AMYLASE in the last 168 hours.  Recent Labs Lab 03/30/15 2005  AMMONIA 19    CBC:  Recent Labs Lab 03/30/15 1148 03/31/15 0550 04/01/15 0258  WBC 7.8 7.4 8.3  HGB 14.1 13.7 14.1  HCT 39.7 40.3 41.3  MCV 88.4 89.4 89.2  PLT 127* 128* 134*    Cardiac Enzymes: No results for input(s): CKTOTAL, CKMB, CKMBINDEX, TROPONINI in the last 168 hours.  Lipid Panel: No results for input(s): CHOL, TRIG, HDL, CHOLHDL, VLDL, LDLCALC in the last 168 hours.  CBG:  Recent Labs Lab 03/31/15 1642 03/31/15 2108 04/01/15 0802 04/01/15 1130 04/01/15 1632  GLUCAP 160* 179* 126* 101 242*    Microbiology: Results for orders placed or performed during the hospital encounter of 03/30/15  MRSA PCR Screening     Status: None   Collection Time: 03/30/15  9:27 PM  Result Value Ref Range Status   MRSA by PCR NEGATIVE NEGATIVE Final    Comment:        The GeneXpert MRSA Assay (FDA approved for NASAL specimens only), is one component of a comprehensive MRSA colonization surveillance program. It is not intended to diagnose MRSA infection nor to guide or monitor treatment for MRSA infections.     Coagulation Studies: No results for input(s): LABPROT, INR in the last 72 hours.  Imaging: No results found.  Medications:  I have reviewed the patient's current medications. Scheduled: . amLODipine  10 mg Oral Daily  . aspirin EC  81 mg Oral Daily  . atorvastatin  20 mg Oral q1800  . calcitRIOL  0.75 mcg Oral Q M,W,F-HD  . calcium acetate  1,334 mg Oral TID WC  . clopidogrel  75 mg Oral Daily  . enoxaparin (LOVENOX) injection  30 mg Subcutaneous Q24H  . feeding supplement (NEPRO CARB STEADY)  237 mL Oral BID BM  .  FLUoxetine  20 mg Oral QHS  . hydrALAZINE  50 mg Oral 3 times per day  . insulin aspart  0-15 Units Subcutaneous TID WC  . insulin aspart  0-5 Units Subcutaneous QHS  . insulin aspart  10 Units Subcutaneous Once  . insulin detemir  30 Units Subcutaneous QHS  . lisinopril  20 mg Oral Q1200  . multivitamin  1 tablet Oral Daily  . sodium chloride  3 mL Intravenous Q12H    Assessment/Plan: Patient appears at baseline.  Much improved off Valtrex.  No further neurological work up recommended at this time.  Will sign off.       LOS: 3 days   Alexis Goodell, MD [facility] [phone] 04/02/2015  9:04 AM

## 2015-04-02 NOTE — Care Management Note (Signed)
Case Management Note  Patient Details  Name: CORDARYL DECELLES MRN: [id number] Date of Birth: 05-31-1951  Subjective/Objective:                 Spoke with patient and daughter at bedside. Family and patient chose to have [facility] for [facility] RN services. Miranda with [facility] notified of referral. Patient also given information and prices on [employer] brand testing meter and supplies. CM unable to provide samples.   Action/Plan:  DC to home today with [facility] services. Expected Discharge Date:                  Expected Discharge Plan:  [place]  In-House Referral:     Discharge planning Services  CM Consult  Post Acute Care Choice:  Home Health Choice offered to:  Patient, Adult Children  DME Arranged:    DME Agency:     HH Arranged:  RN [place] Agency:  [place]  Status of Service:  Completed, signed off  Medicare Important Message Given:    Date Medicare IM Given:    Medicare IM give by:    Date Additional Medicare IM Given:    Additional Medicare Important Message give by:     If discussed at [place] of Stay Meetings, dates discussed:    Additional Comments:  Carles Collet, RN 04/02/2015, 10:49 AM

## 2015-04-02 NOTE — Progress Notes (Signed)
Subjective:  No cos   Objective Vital signs in last 24 hours: Filed Vitals:   04/01/15 0800 04/01/15 1416 04/01/15 2140 04/02/15 0604  BP:  98/55 113/58 113/42  Pulse:  96 90 66  Temp:  97.8 F (36.6 C) 98.2 F (36.8 C) 97.8 F (36.6 C)  TempSrc:  Oral Oral Oral  Resp:  20 20 20   Height: 5\' 8"  (1.727 m)     Weight: 98.793 kg (217 lb 12.8 oz)     SpO2:  95% 98% 99%   Weight change: -2.307 kg (-5 lb 1.4 oz) Physical Exam: General:NAD / alert WM /back to baseline MSOX3 Heart: RRR, no rub or mur. Lungs: CTA , non labored breathing  Abdomen: BS pos , obese, soft , NT ND Extremities: no pedal edema Dialysis Acces= pos bruit LAVF   Op HD Orders: Center: [facility] on MWF . 200 dialyzer  EDW 110 KG but Last 3 tx leaving below 106.4, 106.8 , 108.1 And stable bp HD Bath 2k, 2 ca Time 4hrs 65min Heparin 10,000 initial then 3000 bolius. Access LUA AVF  PO Calcitriol 0.92mcg/HD  Other op labs hgb 13.2, ca 9.1 phos 6.4 pth 400   Problem/Plan: 1. Encephalopathy = Probably secondary to med effect Valtrex ( nonrenal dose) back to baseline MS this am off Valtrex /  satble for dc from renal  2. ESRD - MWF HD ,k 3.9 HD could be done today as op second shift today in [place]. unit   3. Hypertension/volume - 113/42/ bp Now On bp meds Amlodipine/ Lisinopril / and hydralazine added.tid needs to hold pre hd/ Below op edw sec to decr po .new edw~~99.5 kg with clothes  4. Anemia - hgb 14.1 No esa  5. Metabolic bone disease - binders and Vit d per [facility] hx 6. Nutrition - renal / carb mod  7. DM Type 2- per Admit 8. Thrombocytopenia - Chronic  Ernest Haber, PA-C [place] [facility] [phone] 04/02/2015,10:06 AM  LOS: 3 days   Labs: Basic Metabolic Panel:  Recent Labs Lab 03/30/15 1148 03/31/15 0550 04/01/15 0258  NA 134* 137 137  K 4.2 3.8 3.9  CL 100* 104 104  CO2 23 21* 19*  GLUCOSE 306* 85 147*  BUN 33* 36* 43*  CREATININE  6.48* 7.36* 8.34*  CALCIUM 8.9 8.8* 9.1  PHOS  --   --  5.3*   Liver Function Tests:  Recent Labs Lab 03/30/15 1148 04/01/15 0258  AST 18  --   ALT 19  --   ALKPHOS 50  --   BILITOT 0.6  --   PROT 6.1*  --   ALBUMIN 2.8* 2.8*   No results for input(s): LIPASE, AMYLASE in the last 168 hours.  Recent Labs Lab 03/30/15 2005  AMMONIA 19   CBC:  Recent Labs Lab 03/30/15 1148 03/31/15 0550 04/01/15 0258  WBC 7.8 7.4 8.3  HGB 14.1 13.7 14.1  HCT 39.7 40.3 41.3  MCV 88.4 89.4 89.2  PLT 127* 128* 134*   Cardiac Enzymes: No results for input(s): CKTOTAL, CKMB, CKMBINDEX, TROPONINI in the last 168 hours. CBG:  Recent Labs Lab 03/31/15 1642 03/31/15 2108 04/01/15 0802 04/01/15 1130 04/01/15 1632  GLUCAP 160* 179* 126* 88 242*    Studies/Results: No results found. Medications:   . amLODipine  10 mg Oral Daily  . aspirin EC  81 mg Oral Daily  . atorvastatin  20 mg Oral q1800  . calcitRIOL  0.75 mcg Oral Q M,W,F-HD  .  calcium acetate  1,334 mg Oral TID WC  . clopidogrel  75 mg Oral Daily  . enoxaparin (LOVENOX) injection  30 mg Subcutaneous Q24H  . feeding supplement (NEPRO CARB STEADY)  237 mL Oral BID BM  . FLUoxetine  20 mg Oral QHS  . hydrALAZINE  50 mg Oral 3 times per day  . insulin aspart  0-15 Units Subcutaneous TID WC  . insulin aspart  0-5 Units Subcutaneous QHS  . insulin aspart  10 Units Subcutaneous Once  . insulin detemir  30 Units Subcutaneous QHS  . lisinopril  20 mg Oral Q1200  . multivitamin  1 tablet Oral Daily  . sodium chloride  3 mL Intravenous Q12H

## 2015-05-09 ENCOUNTER — Ambulatory Visit (HOSPITAL_COMMUNITY): Payer: BLUE CROSS/BLUE SHIELD | Admitting: Psychiatry

## 2015-05-19 ENCOUNTER — Encounter: Payer: Self-pay | Admitting: *Deleted

## 2015-05-20 ENCOUNTER — Ambulatory Visit (INDEPENDENT_AMBULATORY_CARE_PROVIDER_SITE_OTHER): Payer: BLUE CROSS/BLUE SHIELD | Admitting: Diagnostic Neuroimaging

## 2015-05-20 ENCOUNTER — Encounter: Payer: Self-pay | Admitting: Diagnostic Neuroimaging

## 2015-05-20 VITALS — BP 117/59 | HR 72 | Ht 68.0 in | Wt 227.2 lb

## 2015-05-20 DIAGNOSIS — G92 Toxic encephalopathy: Secondary | ICD-10-CM | POA: Diagnosis not present

## 2015-05-20 DIAGNOSIS — M5416 Radiculopathy, lumbar region: Secondary | ICD-10-CM | POA: Diagnosis not present

## 2015-05-20 DIAGNOSIS — G929 Unspecified toxic encephalopathy: Secondary | ICD-10-CM

## 2015-05-20 NOTE — Patient Instructions (Signed)
Thank you for coming to see [place] at [facility]. I hope we have been able to provide you high quality care today.  You may receive a patient satisfaction survey over the next few weeks. We would appreciate your feedback and comments so that we may continue to improve ourselves and the health of our patients.  - continue current medications - follow up as needed   ~~~~~~~~~~~~~~~~~~~~~~~~~~~~~~~~~~~~~~~~~~~~~~~~~~~~~~~~~~~~~~~~~  DR. Dat Derksen'S GUIDE TO HAPPY AND HEALTHY LIVING These are some of my general health and wellness recommendations. Some of them may apply to you better than others. Please use common sense as you try these suggestions and feel free to ask me any questions.   ACTIVITY/FITNESS Mental, social, emotional and physical stimulation are very important for brain and body health. Try learning a new activity (arts, music, language, sports, games).  Keep moving your body to the best of your abilities. You can do this at home, inside or outside, the park, community center, gym or anywhere you like. Consider a physical therapist or personal trainer to get started. Consider the app Sworkit. Fitness trackers such as smart-watches, smart-phones or Fitbits can help as well.   RELAXATION Consider practicing mindfulness meditation or other relaxation techniques such as deep breathing, prayer, yoga, tai chi, massage. See website mindful.org or the apps Headspace or Calm to help get started.   SLEEP Try to get at least 7-8+ hours sleep per day. Regular exercise and reduced caffeine will help you sleep better. Practice good sleep hygeine techniques. See website sleep.org for more information.   PLANNING Prepare estate planning, living will, healthcare POA documents. Sometimes this is best planned with the help of an attorney. Theconversationproject.org and agingwithdignity.org are excellent resources.

## 2015-05-20 NOTE — Progress Notes (Signed)
[facility]  PATIENT: Kenneth [name] DOB: February 27, 1951  REFERRING CLINICIAN: F[place]co HISTORY FROM: patient and mother REASON FOR VISIT: new consult    HISTORICAL  CHIEF COMPLAINT:  Chief Complaint  Patient presents with  . Transient Ischemic Attack    rm 6, New Patient, mother- Vira Agar    HISTORY OF PRESENT ILLNESS:   64 year old right-handed male here for evaluation of TIA. Patient has history of hypertension, diabetes, end-stage renal disease on hemodialysis.  02/25/15 patient had onset of slurred speech and confusion. Patient was admitted for TIA workup. He declined MRI was discharged the next day.  At some point patient was diagnosed with possible shingles infection in and of July 2016 and treated with valacyclovir. On 03/21/15 patient presented to the emergency room for visual hallucinations, only when he would close his eyes. Patient was recommended to have MRI scan, but patient declined. Apparently patient left AGAINST MEDICAL ADVICE.  03/30/15 patient had increasing confusion and was brought to the emergency room by friend. Apparently patient was not taking his medications correctly. He was missing dialysis. He was admitted for acute encephalopathy. Patient was hypertensive in the emergency room 183/71. Also possibility of valacyclovir neurotoxicity was raised. Lab testing, MRI of the brain, and other evaluations were negative. Patient gradually improved to baseline.  Patient now back to baseline. He is doing well. He is taking his medications.  Of note patient does report low back pain, radiating to left leg with left leg weakness and numbness. Apparently he saw a spine doctor or sports medicine doctor who recommended steroid injections to help with symptoms. Physical therapy was also recommended. Patient had declined these therapies so far.   REVIEW OF SYSTEMS: Full 14 system review of systems performed and notable only for only as per history of present  illness.  ALLERGIES: Allergies  Allergen Reactions  . Penicillins Other (See Comments)     Family history of allergies and hence he does not use this - states mom's MD told him never to take it.    HOME MEDICATIONS: Outpatient Prescriptions Prior to Visit  Medication Sig Dispense Refill  . aspirin EC 81 MG tablet Take 81 mg by mouth daily.    . calcium acetate (PHOSLO) 667 MG capsule Take 2 capsules (1,334 mg total) by mouth 3 (three) times daily with meals. 180 capsule 0  . clopidogrel (PLAVIX) 75 MG tablet Take 1 tablet (75 mg total) by mouth daily. 30 tablet 1  . Insulin Detemir (LEVEMIR FLEXTOUCH) 100 UNIT/ML Pen Inject 30 Units into the skin every morning. 15 mL 11  . lisinopril (PRINIVIL,ZESTRIL) 20 MG tablet Take 20 mg by mouth daily at 12 noon.  5  . multivitamin (RENA-VIT) TABS tablet Take 1 tablet by mouth daily.    . simvastatin (ZOCOR) 40 MG tablet Take 40 mg by mouth daily at 6 PM.    . acetaminophen (TYLENOL) 500 MG tablet Take 1,000 mg by mouth every 8 (eight) hours as needed for mild pain or moderate pain.     . calcitRIOL (ROCALTROL) 0.25 MCG capsule Take 3 capsules (0.75 mcg total) by mouth every Monday, Wednesday, and Friday with hemodialysis. 30 capsule 0  . amLODipine (NORVASC) 2.5 MG tablet Take 1 tablet (2.5 mg total) by mouth daily. 30 tablet 1  . FLUoxetine (PROZAC) 20 MG capsule Take 1 capsule (20 mg total) by mouth at bedtime. 30 capsule 0  . hydrALAZINE (APRESOLINE) 50 MG tablet Take 1 tablet (50 mg total) by mouth every 8 (  eight) hours. 90 tablet 0   No facility-administered medications prior to visit.    PAST MEDICAL HISTORY: Past Medical History  Diagnosis Date  . Diabetes mellitus without complication ([place])   . Renal disorder   . Blood disease     states that he makes too many red blood cells.  . Hypertension     history of, off medication now  . Hyperlipidemia   . GERD (gastroesophageal reflux disease)     history  . Arthritis   . Carpal  tunnel syndrome   . ESRD (end stage renal disease) ([place])     on Dialysis M, W, F  . TIA (transient ischemic attack) 03/2015    PAST SURGICAL HISTORY: Past Surgical History  Procedure Laterality Date  . Hernia repair  2683    umbilical  . Hand surgery Right     tendon and muscle repair after injury  . Av fistula placement Left 09/14/2013    Procedure: ARTERIOVENOUS (AV) [place];  Surgeon: Rosetta Posner, MD;  Location: [facility] OR;  Service: Vascular;  Laterality: Left;    FAMILY HISTORY: Family History  Problem Relation Age of Onset  . Cancer Father   . Diabetes Father   . Heart disease Father     Heart Disease before age 31  . Heart attack Father   . Hypertension Daughter   . Hyperlipidemia Son   . Glaucoma Mother     SOCIAL HISTORY:  Social History   Social History  . Marital Status: Divorced    Spouse Name: N/A  . Number of Children: 3  . Years of Education: 13   Occupational History  .      disabled, dialysis   Social History Main Topics  . Smoking status: Never Smoker   . Smokeless tobacco: Never Used  . Alcohol Use: No     Comment: quit 1977  . Drug Use: No  . Sexual Activity: Not on file   Other Topics Concern  . Not on file   Social History Narrative   Lives in apartment with mother   Caffeine use- soft drinks 3 a day     PHYSICAL EXAM  GENERAL EXAM/CONSTITUTIONAL: Vitals:  Filed Vitals:   05/20/15 1118  BP: 117/59  Pulse: 72  Height: 5\' 8"  (1.727 m)  Weight: 227 lb 3.2 oz (103.057 kg)     Body mass index is 34.55 kg/(m^2).  Visual Acuity Screening   Right eye Left eye Both eyes  Without correction:     With correction: 20/40 20/50      Patient is in no distress; well developed, nourished and groomed; neck is supple  CARDIOVASCULAR:  Examination of carotid arteries is normal; no carotid bruits  Regular rate and rhythm, no murmurs  Examination of peripheral vascular system by observation and palpation is  normal  EYES:  Ophthalmoscopic exam of optic discs and posterior segments is normal; no papilledema or hemorrhages  MUSCULOSKELETAL:  Gait, strength, tone, movements noted in Neurologic exam below  NEUROLOGIC: MENTAL STATUS:  No flowsheet data found.  awake, alert, oriented to person, place and time  recent and remote memory intact  normal attention and concentration  language fluent, comprehension intact, naming intact,   fund of knowledge appropriate  CRANIAL NERVE:   2nd - no papilledema on fundoscopic exam  2nd, 3rd, 4th, 6th - pupils equal and reactive to light, visual fields full to confrontation, extraocular muscles intact, no nystagmus  5th - facial sensation symmetric  7th - facial strength symmetric  8th - hearing intact  9th - palate elevates symmetrically, uvula midline  11th - shoulder shrug symmetric  12th - tongue protrusion midline  MOTOR:   normal bulk and tone, full strength in the BUE, BLE  SENSORY:   normal and symmetric to light touch, pinprick, temperature, vibration;EXCEPT DECR IN RIGHT HAND (DIGITS 1-3) AND LEFT FOOT  COORDINATION:   finger-nose-finger, fine finger movements normal  REFLEXES:   deep tendon reflexes TRACE and symmetric  GAIT/STATION:   narrow based gait; ANTALGIC; LIMPING    DIAGNOSTIC DATA (LABS, IMAGING, TESTING) - I reviewed patient records, labs, notes, testing and imaging myself where available.  Lab Results  Component Value Date   WBC 8.3 04/01/2015   HGB 14.1 04/01/2015   HCT 41.3 04/01/2015   MCV 89.2 04/01/2015   PLT 134* 04/01/2015      Component Value Date/Time   NA 137 04/01/2015 0258   K 3.9 04/01/2015 0258   CL 104 04/01/2015 0258   CO2 19* 04/01/2015 0258   GLUCOSE 147* 04/01/2015 0258   BUN 43* 04/01/2015 0258   CREATININE 8.34* 04/01/2015 0258   CALCIUM 9.1 04/01/2015 0258   PROT 6.1* 03/30/2015 1148   ALBUMIN 2.8* 04/01/2015 0258   AST 18 03/30/2015 1148   ALT 19  03/30/2015 1148   ALKPHOS 50 03/30/2015 1148   BILITOT 0.6 03/30/2015 1148   GFRNONAA 6* 04/01/2015 0258   GFRAA 7* 04/01/2015 0258   Lab Results  Component Value Date   CHOL 187 02/26/2015   HDL 33* 02/26/2015   LDLCALC 109* 02/26/2015   TRIG 226* 02/26/2015   CHOLHDL 5.7 02/26/2015   Lab Results  Component Value Date   HGBA1C 11.1* 02/25/2015   Lab Results  Component Value Date   VITAMINB12 836 03/30/2015   Lab Results  Component Value Date   TSH 2.618 03/30/2015    02/26/15 carotid u/s  - No hemodynamically significant stenosis or plaque is noted in either cervical carotid artery.  02/26/15 TTE  - Left ventricle: The cavity size was normal. Wall thickness wasnormal. Systolic function was normal. The estimated ejection fraction was in the range of 55% to 60%. Left ventriculardiastolic function parameters were normal. - Aortic valve: Mildly calcified annulus. Trileaflet; normalthickness leaflets. Valve area (VTI): 1.87 cm^2. Valve area(Vmax): 1.84 cm^2. Valve area (Vmean): 1.78 cm^2. - Mitral valve: Mildly calcified annulus. Mildly thickened leaflets. - Left atrium: The atrium was moderately dilated. - Technically difficult study.  02/25/15 CT head [I reviewed images myself and agree with interpretation. -VRP]  - No acute intracranial pathology.  03/21/15 CT head [I reviewed images myself and agree with interpretation. -VRP]  - No acute findings.  03/30/15 MRI BRAIN [I reviewed images myself and agree with interpretation. -VRP]  1. No acute intracranial abnormality. 2. Mild chronic small vessel ischemic disease.  03/31/15 EEG  - Normal electroencephalogram. There are no focal lateralizing or epileptiform features.     ASSESSMENT AND PLAN  64 y.o. year old male here with transient slurred speech and confusion in July 2016 possibly TIA, with negative workup. Then with possible valacyclovir neurotoxicity and encephalopathy in August 2016. Now patient back to  baseline. No further neurologic testing or treatment advised. Advised patient to follow-up with PCP, sports medicine or spine surgeon for low back pain/lumbar radiculopathy.   Dx:  Toxic encephalopathy  Lumbar radiculopathy    PLAN: - continue current medications - follow up as needed   Return if symptoms worsen or  fail to improve, for return to PCP.    Penni Bombard, MD 88/03/2799, 34:91 PM Certified in Neurology, Neurophysiology and Neuroimaging  [facility] [address] [place], [place] [address] [phone]

## 2015-09-04 ENCOUNTER — Other Ambulatory Visit (HOSPITAL_COMMUNITY): Payer: Self-pay | Admitting: Family Medicine

## 2015-09-04 ENCOUNTER — Ambulatory Visit (HOSPITAL_COMMUNITY)
Admission: RE | Admit: 2015-09-04 | Discharge: 2015-09-04 | Disposition: A | Discharge status: Home / Self Care | Payer: BLUE CROSS/BLUE SHIELD | Source: Ambulatory Visit | Attending: Family Medicine | Admitting: Family Medicine

## 2015-09-04 DIAGNOSIS — M898X1 Other specified disorders of bone, shoulder: Secondary | ICD-10-CM | POA: Diagnosis not present

## 2015-09-04 DIAGNOSIS — R0781 Pleurodynia: Secondary | ICD-10-CM | POA: Insufficient documentation

## 2015-09-04 DIAGNOSIS — R079 Chest pain, unspecified: Secondary | ICD-10-CM | POA: Diagnosis not present

## 2015-10-14 ENCOUNTER — Encounter: Payer: Self-pay | Admitting: Cardiovascular Disease

## 2015-10-14 ENCOUNTER — Encounter: Payer: Self-pay | Admitting: *Deleted

## 2015-10-14 ENCOUNTER — Ambulatory Visit (INDEPENDENT_AMBULATORY_CARE_PROVIDER_SITE_OTHER): Payer: BLUE CROSS/BLUE SHIELD | Admitting: Cardiovascular Disease

## 2015-10-14 VITALS — BP 148/58 | HR 72 | Ht 67.0 in | Wt 221.0 lb

## 2015-10-14 DIAGNOSIS — I10 Essential (primary) hypertension: Secondary | ICD-10-CM | POA: Diagnosis not present

## 2015-10-14 DIAGNOSIS — R079 Chest pain, unspecified: Secondary | ICD-10-CM

## 2015-10-14 DIAGNOSIS — N186 End stage renal disease: Secondary | ICD-10-CM | POA: Diagnosis not present

## 2015-10-14 DIAGNOSIS — R5383 Other fatigue: Secondary | ICD-10-CM

## 2015-10-14 DIAGNOSIS — E785 Hyperlipidemia, unspecified: Secondary | ICD-10-CM

## 2015-10-14 MED ORDER — CLOPIDOGREL BISULFATE 75 MG PO TABS: 75.0000 mg | ORAL_TABLET | Freq: Every day | ORAL | Status: DC

## 2015-10-14 NOTE — Progress Notes (Signed)
Patient ID: Kenneth [name], male   DOB: 10/03/1950, 65 y.o.   MRN: [id number]       CARDIOLOGY CONSULT NOTE  Patient ID: Kenneth [name] MRN: [id number] DOB/AGE: 08-18-1950 65 y.o.  Admit date: (Not on file) Primary Physician Rocky Morel, MD  Reason for Consultation: chest pain  HPI: The patient is a 65 year old male with a past medical history significant for end-stage renal disease on hemodialysis, hypertension, diabetes mellitus, and dyslipidemia. He is referred for the evaluation of chest pain. Echocardiogram on 02/26/15 demonstrated normal left ventricular systolic function, EF 0000000. ECG on 03/30/15 demonstrated normal sinus rhythm with a left anterior fascicular block.  He has had chest pain in the left precordium since August. He has also noticed progressive fatigue and diminished activity level since that time. The chest pain is not provoked by exertion. He also has chest wall tenderness. He dialyzes on Monday, Wednesday, and Friday. Denies shortness of breath, orthopnea, and paroxysmal nocturnal dyspnea.  He is here with his mother.  Allergies  Allergen Reactions  . Penicillins Other (See Comments)     Family history of allergies and hence he does not use this - states mom's MD told him never to take it.    Current Outpatient Prescriptions  Medication Sig Dispense Refill  . acetaminophen (TYLENOL) 500 MG tablet Take 1,000 mg by mouth every 8 (eight) hours as needed for mild pain or moderate pain.     Marland Kitchen aspirin EC 81 MG tablet Take 81 mg by mouth daily.    . calcitRIOL (ROCALTROL) 0.25 MCG capsule Take 3 capsules (0.75 mcg total) by mouth every Monday, Wednesday, and Friday with hemodialysis. 30 capsule 0  . calcium acetate (PHOSLO) 667 MG capsule Take 2 capsules (1,334 mg total) by mouth 3 (three) times daily with meals. 180 capsule 0  . clopidogrel (PLAVIX) 75 MG tablet Take 1 tablet (75 mg total) by mouth daily. 30 tablet 1  . Insulin Detemir (LEVEMIR  FLEXTOUCH) 100 UNIT/ML Pen Inject 30 Units into the skin every morning. 15 mL 11  . lisinopril (PRINIVIL,ZESTRIL) 20 MG tablet Take 20 mg by mouth daily at 12 noon.  5  . multivitamin (RENA-VIT) TABS tablet Take 1 tablet by mouth daily.    . simvastatin (ZOCOR) 40 MG tablet Take 40 mg by mouth daily at 6 PM.     No current facility-administered medications for this visit.    Past Medical History  Diagnosis Date  . Diabetes mellitus without complication ([place])   . Renal disorder   . Blood disease     states that he makes too many red blood cells.  . Hypertension     history of, off medication now  . Hyperlipidemia   . GERD (gastroesophageal reflux disease)     history  . Arthritis   . Carpal tunnel syndrome   . ESRD (end stage renal disease) ([place])     on Dialysis M, W, F  . TIA (transient ischemic attack) 03/2015    Past Surgical History  Procedure Laterality Date  . Hernia repair  [id number]    umbilical  . Hand surgery Right     tendon and muscle repair after injury  . Av fistula placement Left 09/14/2013    Procedure: ARTERIOVENOUS (AV) [place];  Surgeon: Rosetta Posner, MD;  Location: [facility] OR;  Service: Vascular;  Laterality: Left;    Social History   Social History  . Marital Status: Divorced    Spouse Name:  N/A  . Number of Children: 3  . Years of Education: 13   Occupational History  .      disabled, dialysis   Social History Main Topics  . Smoking status: Never Smoker   . Smokeless tobacco: Never Used  . Alcohol Use: No     Comment: quit 1977  . Drug Use: No  . Sexual Activity: Not on file   Other Topics Concern  . Not on file   Social History Narrative   Lives in apartment with mother   Caffeine use- soft drinks 3 a day     No family history of premature CAD in 1st degree relatives.  Prior to Admission medications   Medication Sig Start Date End Date Taking? Authorizing Provider  acetaminophen (TYLENOL) 500 MG tablet Take 1,000 mg by  mouth every 8 (eight) hours as needed for mild pain or moderate pain.    Yes Historical Provider, MD  aspirin EC 81 MG tablet Take 81 mg by mouth daily.   Yes Historical Provider, MD  calcitRIOL (ROCALTROL) 0.25 MCG capsule Take 3 capsules (0.75 mcg total) by mouth every Monday, Wednesday, and Friday with hemodialysis. 04/02/15  Yes Albertine Patricia, MD  calcium acetate (PHOSLO) 667 MG capsule Take 2 capsules (1,334 mg total) by mouth 3 (three) times daily with meals. 04/02/15  Yes Albertine Patricia, MD  clopidogrel (PLAVIX) 75 MG tablet Take 1 tablet (75 mg total) by mouth daily. 02/26/15  Yes Lezlie Octave Black, NP  Insulin Detemir (LEVEMIR FLEXTOUCH) 100 UNIT/ML Pen Inject 30 Units into the skin every morning. 04/02/15  Yes Silver Huguenin Elgergawy, MD  lisinopril (PRINIVIL,ZESTRIL) 20 MG tablet Take 20 mg by mouth daily at 12 noon. 03/07/15  Yes Historical Provider, MD  multivitamin (RENA-VIT) TABS tablet Take 1 tablet by mouth daily.   Yes Historical Provider, MD  simvastatin (ZOCOR) 40 MG tablet Take 40 mg by mouth daily at 6 PM.   Yes Historical Provider, MD     Review of systems complete and found to be negative unless listed above in HPI     Physical exam Blood pressure 148/58, pulse 72, height 5\' 7"  (1.702 m), weight 221 lb (100.245 kg), SpO2 95 %. General: NAD Neck: No JVD, no thyromegaly or thyroid nodule.  Lungs: Clear to auscultation bilaterally with normal respiratory effort. CV: Nondisplaced PMI. +chest wall tenderness. Regular rate and rhythm, normal S1/S2, no [id number], soft 1/6 systolic murmur over b/l upper sternal borders.  1+ pitting pretibial edema.  No carotid bruit.   Abdomen: Soft, nontender, no distention.  Skin: Intact without lesions or rashes.  Neurologic: Alert and oriented x 3.  Psych: Normal affect. Extremities: No clubbing or cyanosis.  HEENT: Normal.   ECG: Most recent ECG reviewed.  Labs:   Lab Results  Component Value Date   WBC 8.3 04/01/2015   HGB 14.1  04/01/2015   HCT 41.3 04/01/2015   MCV 89.2 04/01/2015   PLT 134* 04/01/2015   No results for input(s): NA, K, CL, CO2, BUN, CREATININE, CALCIUM, PROT, BILITOT, ALKPHOS, ALT, AST, GLUCOSE in the last 168 hours.  Invalid input(s): LABALBU No results found for: CKTOTAL, CKMB, CKMBINDEX, TROPONINI  Lab Results  Component Value Date   CHOL 187 02/26/2015   CHOL 238 12/10/2008   CHOL 144 10/21/2007   Lab Results  Component Value Date   HDL 33* 02/26/2015   HDL 42 12/10/2008   HDL 41 10/21/2007   Lab Results  Component Value Date  LDLCALC 109* 02/26/2015   LDLCALC 144 12/10/2008   LDLCALC 66 10/21/2007   Lab Results  Component Value Date   TRIG 226* 02/26/2015   TRIG 259 12/10/2008   TRIG 183 10/21/2007   Lab Results  Component Value Date   CHOLHDL 5.7 02/26/2015   CHOLHDL 5.7 12/10/2008   CHOLHDL 3.4 Ratio 03/28/2007   No results found for: LDLDIRECT       Studies: No results found.  ASSESSMENT AND PLAN:  1. Chest pain and fatigue: Chest pain symptoms are somewhat atypical for ischemic heart disease, but progressive fatigue is concerning. Has several cardiovascular risk factors. Will proceed with a Lexiscan Cardiolite stress test for further clarification. Continue ASA 81 mg daily for primary prevention.  2. Essential HTN: Elevated today but has not taken meds. Will monitor.  3. Dyslipidemia: On statin therapy.  Dispo: f/u 6 weeks.   Signed: Kate Sable, M.D., F.A.C.C.  10/14/2015, 9:07 AM

## 2015-10-14 NOTE — Patient Instructions (Signed)
Your physician has requested that you have a lexiscan myoview. For further information please visit [email or web address]. Please follow instruction sheet, as given. Office will contact with results via phone or letter.   Continue all current medications. Follow up in  6 weeks   

## 2015-11-04 ENCOUNTER — Encounter (HOSPITAL_COMMUNITY)
Admission: RE | Admit: 2015-11-04 | Discharge: 2015-11-04 | Disposition: A | Discharge status: Home / Self Care | Payer: BLUE CROSS/BLUE SHIELD | Source: Ambulatory Visit | Attending: Cardiovascular Disease | Admitting: Cardiovascular Disease

## 2015-11-04 ENCOUNTER — Encounter (HOSPITAL_COMMUNITY): Payer: Self-pay

## 2015-11-04 DIAGNOSIS — R079 Chest pain, unspecified: Secondary | ICD-10-CM

## 2015-11-04 DIAGNOSIS — R931 Abnormal findings on diagnostic imaging of heart and coronary circulation: Secondary | ICD-10-CM | POA: Insufficient documentation

## 2015-11-04 HISTORY — DX: Disorder of kidney and ureter, unspecified: N28.9

## 2015-11-04 LAB — NM MYOCAR MULTI W/SPECT W/WALL MOTION / EF
CHL CUP RESTING HR STRESS: 99 {beats}/min
CSEPPHR: 106 {beats}/min
LV dias vol: 185 mL (ref 62–150)
LVSYSVOL: 96 mL
NUC STRESS TID: 1.05
RATE: 0.38
SDS: 0
SRS: 6
SSS: 6

## 2015-11-04 MED ORDER — TECHNETIUM TC 99M SESTAMIBI - CARDIOLITE
10.0000 | Freq: Once | INTRAVENOUS | Status: AC | PRN
Start: 2015-11-04 — End: 2015-11-04
  Administered 2015-11-04: 08:00:00 9.4 via INTRAVENOUS

## 2015-11-04 MED ORDER — REGADENOSON 0.4 MG/5ML IV SOLN
INTRAVENOUS | Status: AC
  Administered 2015-11-04: 0.4 mg via INTRAVENOUS
  Filled 2015-11-04: qty 5

## 2015-11-04 MED ORDER — SODIUM CHLORIDE 0.9% FLUSH
INTRAVENOUS | Status: AC
  Administered 2015-11-04: 10 mL via INTRAVENOUS
  Filled 2015-11-04: qty 10

## 2015-11-04 MED ORDER — TECHNETIUM TC 99M SESTAMIBI GENERIC - CARDIOLITE
30.0000 | Freq: Once | INTRAVENOUS | Status: AC | PRN
  Administered 2015-11-04: 29.6 via INTRAVENOUS

## 2015-11-25 ENCOUNTER — Ambulatory Visit (INDEPENDENT_AMBULATORY_CARE_PROVIDER_SITE_OTHER): Payer: Self-pay | Admitting: Cardiovascular Disease

## 2015-11-25 ENCOUNTER — Encounter: Payer: Self-pay | Admitting: Cardiovascular Disease

## 2015-11-25 ENCOUNTER — Ambulatory Visit (INDEPENDENT_AMBULATORY_CARE_PROVIDER_SITE_OTHER): Payer: BLUE CROSS/BLUE SHIELD | Admitting: *Deleted

## 2015-11-25 VITALS — BP 156/69 | HR 66 | Ht 67.0 in | Wt 220.0 lb

## 2015-11-25 DIAGNOSIS — N186 End stage renal disease: Secondary | ICD-10-CM

## 2015-11-25 DIAGNOSIS — Z5181 Encounter for therapeutic drug level monitoring: Secondary | ICD-10-CM | POA: Diagnosis not present

## 2015-11-25 DIAGNOSIS — R5383 Other fatigue: Secondary | ICD-10-CM

## 2015-11-25 DIAGNOSIS — R931 Abnormal findings on diagnostic imaging of heart and coronary circulation: Secondary | ICD-10-CM

## 2015-11-25 DIAGNOSIS — I10 Essential (primary) hypertension: Secondary | ICD-10-CM

## 2015-11-25 DIAGNOSIS — R079 Chest pain, unspecified: Secondary | ICD-10-CM

## 2015-11-25 DIAGNOSIS — I4892 Unspecified atrial flutter: Secondary | ICD-10-CM

## 2015-11-25 DIAGNOSIS — G459 Transient cerebral ischemic attack, unspecified: Secondary | ICD-10-CM

## 2015-11-25 DIAGNOSIS — Z7901 Long term (current) use of anticoagulants: Secondary | ICD-10-CM

## 2015-11-25 DIAGNOSIS — I25118 Atherosclerotic heart disease of native coronary artery with other forms of angina pectoris: Secondary | ICD-10-CM

## 2015-11-25 DIAGNOSIS — E785 Hyperlipidemia, unspecified: Secondary | ICD-10-CM

## 2015-11-25 LAB — POCT INR: INR: 1.1

## 2015-11-25 MED ORDER — WARFARIN SODIUM 5 MG PO TABS: 5.0000 mg | ORAL_TABLET | Freq: Once | ORAL | Status: DC

## 2015-11-25 MED ORDER — ISOSORBIDE MONONITRATE ER 30 MG PO TB24
30.0000 mg | ORAL_TABLET | Freq: Every day | ORAL | Status: DC
Start: 2015-11-25 — End: 2016-06-03

## 2015-11-25 NOTE — Patient Instructions (Addendum)
   Begin Imdur 30mg  daily - new sent to [facility] today. Continue all other medications for now.  Stop your Plavix - AFTER you have visit with our anticoagulation nurse Lattie Haw). You will begin Warfarin after visit above.  Enroll in coumadin clinic.   Follow up in  3 months.

## 2015-11-25 NOTE — Progress Notes (Signed)
Patient ID: Kenneth [name], male   DOB: [birth year], 65 y.o.   MRN: [id number]      SUBJECTIVE: The patient returns for follow-up after undergoing cardiovascular testing performed for the evaluation of chest pain. Nuclear stress test on 11/04/15 demonstrated prior inferior myocardial infarction with no significant ischemia. It also showed that he was in atrial flutter which is a new diagnosis for him. He has a prior history of stroke and takes both aspirin and Plavix. He continues to have left infra-axillary intermittent chest pains and also has some mild shortness of breath at times.   Review of Systems: As per "subjective", otherwise negative.  Allergies  Allergen Reactions  . Penicillins Other (See Comments)     Family history of allergies and hence he does not use this - states mom's MD told him never to take it.    Current Outpatient Prescriptions  Medication Sig Dispense Refill  . acetaminophen (TYLENOL) 500 MG tablet Take 1,000 mg by mouth every 8 (eight) hours as needed for mild pain or moderate pain.     Marland Kitchen aspirin EC 81 MG tablet Take 81 mg by mouth daily.    . calcitRIOL (ROCALTROL) 0.25 MCG capsule Take 3 capsules (0.75 mcg total) by mouth every Monday, Wednesday, and Friday with hemodialysis. 30 capsule 0  . calcium acetate (PHOSLO) 667 MG capsule Take 2 capsules (1,334 mg total) by mouth 3 (three) times daily with meals. 180 capsule 0  . clopidogrel (PLAVIX) 75 MG tablet Take 1 tablet (75 mg total) by mouth daily. 30 tablet 6  . Insulin Detemir (LEVEMIR FLEXTOUCH) 100 UNIT/ML Pen Inject 30 Units into the skin every morning. 15 mL 11  . lisinopril (PRINIVIL,ZESTRIL) 20 MG tablet Take 20 mg by mouth daily at 12 noon.  5  . multivitamin (RENA-VIT) TABS tablet Take 1 tablet by mouth daily.    . simvastatin (ZOCOR) 40 MG tablet Take 40 mg by mouth daily at 6 PM.     No current facility-administered medications for this visit.    Past Medical History  Diagnosis Date  .  Diabetes mellitus without complication ([place])   . Renal disorder   . Blood disease     states that he makes too many red blood cells.  . Hypertension     history of, off medication now  . Hyperlipidemia   . GERD (gastroesophageal reflux disease)     history  . Arthritis   . Carpal tunnel syndrome   . ESRD (end stage renal disease) ([place])     on Dialysis M, W, F  . TIA (transient ischemic attack) 03/2015  . Renal insufficiency     Past Surgical History  Procedure Laterality Date  . Hernia repair  [id number]    umbilical  . Hand surgery Right     tendon and muscle repair after injury  . Av fistula placement Left 09/14/2013    Procedure: ARTERIOVENOUS (AV) [place];  Surgeon: Rosetta Posner, MD;  Location: [facility];  Service: Vascular;  Laterality: Left;    Social History   Social History  . Marital Status: Divorced    Spouse Name: N/A  . Number of Children: 3  . Years of Education: 13   Occupational History  .      disabled, dialysis   Social History Main Topics  . Smoking status: Never Smoker   . Smokeless tobacco: Never Used  . Alcohol Use: No     Comment: quit 1977  . Drug  Use: No  . Sexual Activity: Not on file   Other Topics Concern  . Not on file   Social History Narrative   Lives in apartment with mother   Caffeine use- soft drinks 3 a day     Filed Vitals:   11/25/15 0814  BP: 156/69  Pulse: 66  Height: 5\' 7"  (1.702 m)  Weight: 220 lb (99.791 kg)    PHYSICAL EXAM General: NAD HEENT: Normal. Neck: No JVD, no thyromegaly. Lungs: Clear to auscultation bilaterally with normal respiratory effort. CV: Nondisplaced PMI.  Regular rate and rhythm, normal S1/S2, no S3/S4, no murmur. No pretibial or periankle edema.     Abdomen: Soft, nontender, no distention.  Neurologic: Alert and oriented.  Psych: Normal affect. Skin: Normal. Musculoskeletal: No gross deformities.  ECG: Most recent ECG reviewed.      ASSESSMENT AND PLAN: 1. Chest pain  and fatigue/CAD with prior inferior MI: Continue ASA 81 mg daily and simvastatin for secondary prevention. Will add Imdur 30 mg.  2. Essential HTN: Elevated today. Will monitor given addition of Imdur.  3. Dyslipidemia: On statin therapy.  4. New onset atrial flutter: May have been etiology of CVA. Will d/c Plavix and start warfarin at time of new anticoagulation management encounter in our clinic.  Dispo: f/u 3 months.  Time spent: 40 minutes, of which greater than 50% was spent reviewing symptoms, relevant blood tests and studies, and discussing management plan with the patient.   Kate Sable, M.D., F.A.C.C.

## 2015-12-01 ENCOUNTER — Ambulatory Visit (INDEPENDENT_AMBULATORY_CARE_PROVIDER_SITE_OTHER): Payer: BLUE CROSS/BLUE SHIELD | Admitting: *Deleted

## 2015-12-01 DIAGNOSIS — G459 Transient cerebral ischemic attack, unspecified: Secondary | ICD-10-CM

## 2015-12-01 DIAGNOSIS — I4892 Unspecified atrial flutter: Secondary | ICD-10-CM

## 2015-12-01 DIAGNOSIS — Z5181 Encounter for therapeutic drug level monitoring: Secondary | ICD-10-CM | POA: Diagnosis not present

## 2015-12-04 ENCOUNTER — Ambulatory Visit (INDEPENDENT_AMBULATORY_CARE_PROVIDER_SITE_OTHER): Payer: BLUE CROSS/BLUE SHIELD | Admitting: *Deleted

## 2015-12-04 DIAGNOSIS — G459 Transient cerebral ischemic attack, unspecified: Secondary | ICD-10-CM

## 2015-12-04 DIAGNOSIS — I4892 Unspecified atrial flutter: Secondary | ICD-10-CM

## 2015-12-04 DIAGNOSIS — Z5181 Encounter for therapeutic drug level monitoring: Secondary | ICD-10-CM

## 2015-12-04 LAB — POCT INR: INR: 1.1

## 2015-12-08 ENCOUNTER — Ambulatory Visit (INDEPENDENT_AMBULATORY_CARE_PROVIDER_SITE_OTHER): Payer: BLUE CROSS/BLUE SHIELD | Admitting: *Deleted

## 2015-12-08 DIAGNOSIS — I4892 Unspecified atrial flutter: Secondary | ICD-10-CM

## 2015-12-08 DIAGNOSIS — Z5181 Encounter for therapeutic drug level monitoring: Secondary | ICD-10-CM | POA: Diagnosis not present

## 2015-12-08 DIAGNOSIS — G459 Transient cerebral ischemic attack, unspecified: Secondary | ICD-10-CM | POA: Diagnosis not present

## 2015-12-08 LAB — POCT INR: INR: 2.5

## 2015-12-15 ENCOUNTER — Ambulatory Visit (INDEPENDENT_AMBULATORY_CARE_PROVIDER_SITE_OTHER): Payer: BLUE CROSS/BLUE SHIELD | Admitting: *Deleted

## 2015-12-15 DIAGNOSIS — Z5181 Encounter for therapeutic drug level monitoring: Secondary | ICD-10-CM | POA: Diagnosis not present

## 2015-12-15 DIAGNOSIS — G459 Transient cerebral ischemic attack, unspecified: Secondary | ICD-10-CM

## 2015-12-15 DIAGNOSIS — I4892 Unspecified atrial flutter: Secondary | ICD-10-CM

## 2015-12-15 LAB — POCT INR: INR: 3.5

## 2015-12-22 ENCOUNTER — Ambulatory Visit (INDEPENDENT_AMBULATORY_CARE_PROVIDER_SITE_OTHER): Payer: BLUE CROSS/BLUE SHIELD | Admitting: *Deleted

## 2015-12-22 DIAGNOSIS — I4892 Unspecified atrial flutter: Secondary | ICD-10-CM

## 2015-12-22 DIAGNOSIS — G459 Transient cerebral ischemic attack, unspecified: Secondary | ICD-10-CM | POA: Diagnosis not present

## 2015-12-22 DIAGNOSIS — Z5181 Encounter for therapeutic drug level monitoring: Secondary | ICD-10-CM | POA: Diagnosis not present

## 2015-12-22 LAB — POCT INR: INR: 3.6

## 2016-01-05 ENCOUNTER — Ambulatory Visit (INDEPENDENT_AMBULATORY_CARE_PROVIDER_SITE_OTHER): Payer: BLUE CROSS/BLUE SHIELD | Admitting: *Deleted

## 2016-01-05 DIAGNOSIS — I4892 Unspecified atrial flutter: Secondary | ICD-10-CM | POA: Diagnosis not present

## 2016-01-05 DIAGNOSIS — Z5181 Encounter for therapeutic drug level monitoring: Secondary | ICD-10-CM | POA: Diagnosis not present

## 2016-01-05 DIAGNOSIS — G459 Transient cerebral ischemic attack, unspecified: Secondary | ICD-10-CM

## 2016-01-05 LAB — POCT INR: INR: 1.5

## 2016-01-13 ENCOUNTER — Ambulatory Visit (INDEPENDENT_AMBULATORY_CARE_PROVIDER_SITE_OTHER): Payer: BLUE CROSS/BLUE SHIELD | Admitting: *Deleted

## 2016-01-13 DIAGNOSIS — I4892 Unspecified atrial flutter: Secondary | ICD-10-CM

## 2016-01-13 DIAGNOSIS — G459 Transient cerebral ischemic attack, unspecified: Secondary | ICD-10-CM

## 2016-01-13 DIAGNOSIS — Z5181 Encounter for therapeutic drug level monitoring: Secondary | ICD-10-CM | POA: Diagnosis not present

## 2016-01-13 LAB — POCT INR: INR: 1.5

## 2016-01-20 ENCOUNTER — Ambulatory Visit (INDEPENDENT_AMBULATORY_CARE_PROVIDER_SITE_OTHER): Payer: BLUE CROSS/BLUE SHIELD | Admitting: *Deleted

## 2016-01-20 DIAGNOSIS — Z5181 Encounter for therapeutic drug level monitoring: Secondary | ICD-10-CM | POA: Diagnosis not present

## 2016-01-20 DIAGNOSIS — G459 Transient cerebral ischemic attack, unspecified: Secondary | ICD-10-CM

## 2016-01-20 DIAGNOSIS — I4892 Unspecified atrial flutter: Secondary | ICD-10-CM

## 2016-01-20 LAB — POCT INR: INR: 1.8

## 2016-01-27 ENCOUNTER — Ambulatory Visit (INDEPENDENT_AMBULATORY_CARE_PROVIDER_SITE_OTHER): Payer: BLUE CROSS/BLUE SHIELD | Admitting: *Deleted

## 2016-01-27 DIAGNOSIS — Z5181 Encounter for therapeutic drug level monitoring: Secondary | ICD-10-CM

## 2016-01-27 DIAGNOSIS — I4892 Unspecified atrial flutter: Secondary | ICD-10-CM

## 2016-01-27 DIAGNOSIS — G459 Transient cerebral ischemic attack, unspecified: Secondary | ICD-10-CM

## 2016-01-27 LAB — POCT INR: INR: 2.3

## 2016-02-10 ENCOUNTER — Ambulatory Visit (INDEPENDENT_AMBULATORY_CARE_PROVIDER_SITE_OTHER): Payer: BLUE CROSS/BLUE SHIELD | Admitting: *Deleted

## 2016-02-10 DIAGNOSIS — I4892 Unspecified atrial flutter: Secondary | ICD-10-CM

## 2016-02-10 DIAGNOSIS — Z5181 Encounter for therapeutic drug level monitoring: Secondary | ICD-10-CM | POA: Diagnosis not present

## 2016-02-10 DIAGNOSIS — G459 Transient cerebral ischemic attack, unspecified: Secondary | ICD-10-CM | POA: Diagnosis not present

## 2016-02-10 LAB — POCT INR: INR: 2.2

## 2016-03-01 ENCOUNTER — Ambulatory Visit (INDEPENDENT_AMBULATORY_CARE_PROVIDER_SITE_OTHER): Payer: BLUE CROSS/BLUE SHIELD | Admitting: Cardiovascular Disease

## 2016-03-02 ENCOUNTER — Encounter: Payer: Self-pay | Admitting: Cardiovascular Disease

## 2016-03-02 ENCOUNTER — Ambulatory Visit (INDEPENDENT_AMBULATORY_CARE_PROVIDER_SITE_OTHER): Payer: BLUE CROSS/BLUE SHIELD | Admitting: Cardiovascular Disease

## 2016-03-02 ENCOUNTER — Ambulatory Visit (INDEPENDENT_AMBULATORY_CARE_PROVIDER_SITE_OTHER): Payer: BLUE CROSS/BLUE SHIELD | Admitting: *Deleted

## 2016-03-02 VITALS — BP 152/72 | HR 112 | Ht 67.0 in | Wt 212.0 lb

## 2016-03-02 DIAGNOSIS — I10 Essential (primary) hypertension: Secondary | ICD-10-CM

## 2016-03-02 DIAGNOSIS — G459 Transient cerebral ischemic attack, unspecified: Secondary | ICD-10-CM | POA: Diagnosis not present

## 2016-03-02 DIAGNOSIS — I4892 Unspecified atrial flutter: Secondary | ICD-10-CM

## 2016-03-02 DIAGNOSIS — R0602 Shortness of breath: Secondary | ICD-10-CM | POA: Diagnosis not present

## 2016-03-02 DIAGNOSIS — E785 Hyperlipidemia, unspecified: Secondary | ICD-10-CM

## 2016-03-02 DIAGNOSIS — I25118 Atherosclerotic heart disease of native coronary artery with other forms of angina pectoris: Secondary | ICD-10-CM

## 2016-03-02 DIAGNOSIS — R079 Chest pain, unspecified: Secondary | ICD-10-CM | POA: Diagnosis not present

## 2016-03-02 DIAGNOSIS — I4891 Unspecified atrial fibrillation: Secondary | ICD-10-CM

## 2016-03-02 DIAGNOSIS — N186 End stage renal disease: Secondary | ICD-10-CM

## 2016-03-02 DIAGNOSIS — Z5181 Encounter for therapeutic drug level monitoring: Secondary | ICD-10-CM | POA: Diagnosis not present

## 2016-03-02 DIAGNOSIS — R931 Abnormal findings on diagnostic imaging of heart and coronary circulation: Secondary | ICD-10-CM

## 2016-03-02 LAB — POCT INR: INR: 2.7

## 2016-03-02 MED ORDER — METOPROLOL TARTRATE 25 MG PO TABS: 25.0000 mg | ORAL_TABLET | Freq: Two times a day (BID) | ORAL | Status: DC

## 2016-03-02 MED ORDER — AMLODIPINE BESYLATE 5 MG PO TABS: 5.0000 mg | ORAL_TABLET | Freq: Every day | ORAL | Status: DC

## 2016-03-02 NOTE — Progress Notes (Signed)
Patient ID: Kenneth [name], male   DOB: 1951-06-22, 65 y.o.   MRN: [id number]      SUBJECTIVE: The patient presents for follow-up of coronary artery disease and atrial flutter. He denies chest pain but he does have shortness of breath both at rest and with exertion. ECG performed in the office today which I personally interpreted shows rapid atrial fibrillation/flutter.   Review of Systems: As per "subjective", otherwise negative.  Allergies  Allergen Reactions  . Penicillins Other (See Comments)     Family history of allergies and hence he does not use this - states mom's MD told him never to take it.    Current Outpatient Prescriptions  Medication Sig Dispense Refill  . acetaminophen (TYLENOL) 500 MG tablet Take 1,000 mg by mouth every 8 (eight) hours as needed for mild pain or moderate pain.     Marland Kitchen aspirin EC 81 MG tablet Take 81 mg by mouth daily.    . calcitRIOL (ROCALTROL) 0.25 MCG capsule Take 3 capsules (0.75 mcg total) by mouth every Monday, Wednesday, and Friday with hemodialysis. 30 capsule 0  . calcium acetate (PHOSLO) 667 MG capsule Take 2 capsules (1,334 mg total) by mouth 3 (three) times daily with meals. 180 capsule 0  . Insulin Detemir (LEVEMIR FLEXTOUCH) 100 UNIT/ML Pen Inject 30 Units into the skin every morning. 15 mL 11  . isosorbide mononitrate (IMDUR) 30 MG 24 hr tablet Take 1 tablet (30 mg total) by mouth daily. 30 tablet 6  . lisinopril (PRINIVIL,ZESTRIL) 20 MG tablet Take 20 mg by mouth daily at 12 noon.  5  . multivitamin (RENA-VIT) TABS tablet Take 1 tablet by mouth daily.    . simvastatin (ZOCOR) 40 MG tablet Take 40 mg by mouth daily at 6 PM.    . warfarin (COUMADIN) 5 MG tablet Take 1 tablet (5 mg total) by mouth one time only at 6 PM. 45 tablet 3   No current facility-administered medications for this visit.    Past Medical History  Diagnosis Date  . Diabetes mellitus without complication ([place])   . Renal disorder   . Blood disease     states that  he makes too many red blood cells.  . Hypertension     history of, off medication now  . Hyperlipidemia   . GERD (gastroesophageal reflux disease)     history  . Arthritis   . Carpal tunnel syndrome   . ESRD (end stage renal disease) ([place])     on Dialysis M, W, F  . TIA (transient ischemic attack) 03/2015  . Renal insufficiency     Past Surgical History  Procedure Laterality Date  . Hernia repair  [id number]    umbilical  . Hand surgery Right     tendon and muscle repair after injury  . Av fistula placement Left 09/14/2013    Procedure: ARTERIOVENOUS (AV) [place];  Surgeon: Rosetta Posner, MD;  Location: [facility];  Service: Vascular;  Laterality: Left;    Social History   Social History  . Marital Status: Divorced    Spouse Name: N/A  . Number of Children: 3  . Years of Education: 13   Occupational History  .      disabled, dialysis   Social History Main Topics  . Smoking status: Never Smoker   . Smokeless tobacco: Never Used  . Alcohol Use: No     Comment: quit 1977  . Drug Use: No  . Sexual Activity: Not on  file   Other Topics Concern  . Not on file   Social History Narrative   Lives in apartment with mother   Caffeine use- soft drinks 3 a day     Filed Vitals:   03/02/16 0915  BP: 152/72  Pulse: 112  Height: 5\' 7"  (1.702 m)  Weight: 212 lb (96.163 kg)  SpO2: 100%    PHYSICAL EXAM General: NAD HEENT: Normal. Neck: No JVD, no thyromegaly. Lungs: Clear to auscultation bilaterally with normal respiratory effort. CV: Tachycardic, irregular rhythm, normal S1/S2, no S3, no murmur. Trace pretibial and periankle edema.    Abdomen: Soft, nontender, no distention.  Neurologic: Alert and oriented.  Psych: Normal affect. Skin: Normal. Musculoskeletal: No gross deformities.    ECG: Most recent ECG reviewed.      ASSESSMENT AND PLAN: 1. Chest pain and fatigue/CAD with prior inferior MI: Continue ASA 81 mg daily and simvastatin for secondary  prevention. Continue Imdur 30 mg.  2. Essential HTN: Elevated today. Will switch lisinopril to amlodipine 5 mg daily given ESRD.  3. Dyslipidemia: On statin therapy.  4.  Rapid atrial flutter/fibrillation: Start metoprolol 25 mg bid. Will have him return in 2 weeks for an ECG to see if HR is controlled. Continue warfarin.  Dispo: f/u 6 months.   Kate Sable, M.D., F.A.C.C.

## 2016-03-02 NOTE — Patient Instructions (Signed)
Medication Instructions:   Stop Lisinopril.   Begin Amlodipine 5mg  daily.  Begin Metoprolol Tart 25mg  twice a day.  Continue all other medications.    New medicaitons above sent to [facility] today.   Labwork:  NONE  Testing/Procedures:  NONE  Follow-Up:  Your physician wants you to follow up in: 6 months.  You will receive a reminder letter in the mail one-two months in advance.  If you don't receive a letter, please call our office to schedule the follow up appointment   Any Other Special Instructions Will Be Listed Below (If Applicable).  Nurse visit due in 2 weeks for EKG.  If you need a refill on your cardiac medications before your next appointment, please call your pharmacy.

## 2016-03-18 ENCOUNTER — Ambulatory Visit (INDEPENDENT_AMBULATORY_CARE_PROVIDER_SITE_OTHER): Payer: BLUE CROSS/BLUE SHIELD | Admitting: *Deleted

## 2016-03-18 DIAGNOSIS — I4892 Unspecified atrial flutter: Secondary | ICD-10-CM | POA: Diagnosis not present

## 2016-03-18 NOTE — Progress Notes (Signed)
Patient notified and verbalized understanding. 

## 2016-03-18 NOTE — Progress Notes (Signed)
Patient in office for EKG today.  Will forward to Dr. Bronson Ing for review.

## 2016-03-18 NOTE — Progress Notes (Signed)
HR controlled. Continue metoprolol at current dose.

## 2016-03-30 ENCOUNTER — Ambulatory Visit (INDEPENDENT_AMBULATORY_CARE_PROVIDER_SITE_OTHER): Payer: BLUE CROSS/BLUE SHIELD | Admitting: *Deleted

## 2016-03-30 DIAGNOSIS — G459 Transient cerebral ischemic attack, unspecified: Secondary | ICD-10-CM | POA: Diagnosis not present

## 2016-03-30 DIAGNOSIS — Z5181 Encounter for therapeutic drug level monitoring: Secondary | ICD-10-CM

## 2016-03-30 DIAGNOSIS — I4892 Unspecified atrial flutter: Secondary | ICD-10-CM | POA: Diagnosis not present

## 2016-03-30 LAB — POCT INR: INR: 2.7

## 2016-04-27 ENCOUNTER — Ambulatory Visit (INDEPENDENT_AMBULATORY_CARE_PROVIDER_SITE_OTHER): Payer: BLUE CROSS/BLUE SHIELD | Admitting: *Deleted

## 2016-04-27 DIAGNOSIS — Z5181 Encounter for therapeutic drug level monitoring: Secondary | ICD-10-CM

## 2016-04-27 DIAGNOSIS — I4892 Unspecified atrial flutter: Secondary | ICD-10-CM | POA: Diagnosis not present

## 2016-04-27 DIAGNOSIS — G459 Transient cerebral ischemic attack, unspecified: Secondary | ICD-10-CM | POA: Diagnosis not present

## 2016-04-27 LAB — POCT INR: INR: 2.3

## 2016-06-03 ENCOUNTER — Other Ambulatory Visit: Payer: Self-pay | Admitting: Cardiovascular Disease

## 2016-06-24 ENCOUNTER — Ambulatory Visit (INDEPENDENT_AMBULATORY_CARE_PROVIDER_SITE_OTHER): Payer: Medicare HMO | Admitting: *Deleted

## 2016-06-24 DIAGNOSIS — I4892 Unspecified atrial flutter: Secondary | ICD-10-CM | POA: Diagnosis not present

## 2016-06-24 DIAGNOSIS — Z5181 Encounter for therapeutic drug level monitoring: Secondary | ICD-10-CM

## 2016-06-24 LAB — POCT INR: INR: 2.3

## 2016-08-05 ENCOUNTER — Ambulatory Visit (INDEPENDENT_AMBULATORY_CARE_PROVIDER_SITE_OTHER): Payer: Medicare Other | Admitting: *Deleted

## 2016-08-05 DIAGNOSIS — I4892 Unspecified atrial flutter: Secondary | ICD-10-CM

## 2016-08-05 DIAGNOSIS — Z5181 Encounter for therapeutic drug level monitoring: Secondary | ICD-10-CM

## 2016-08-05 DIAGNOSIS — G459 Transient cerebral ischemic attack, unspecified: Secondary | ICD-10-CM | POA: Diagnosis not present

## 2016-08-05 LAB — POCT INR: INR: 1.5

## 2016-08-19 ENCOUNTER — Ambulatory Visit (INDEPENDENT_AMBULATORY_CARE_PROVIDER_SITE_OTHER): Payer: Medicare Other | Admitting: *Deleted

## 2016-08-19 DIAGNOSIS — G459 Transient cerebral ischemic attack, unspecified: Secondary | ICD-10-CM

## 2016-08-19 DIAGNOSIS — Z5181 Encounter for therapeutic drug level monitoring: Secondary | ICD-10-CM | POA: Diagnosis not present

## 2016-08-19 DIAGNOSIS — G458 Other transient cerebral ischemic attacks and related syndromes: Secondary | ICD-10-CM

## 2016-08-19 DIAGNOSIS — I4892 Unspecified atrial flutter: Secondary | ICD-10-CM | POA: Diagnosis not present

## 2016-08-19 LAB — POCT INR: INR: 1.5

## 2016-08-26 ENCOUNTER — Ambulatory Visit (INDEPENDENT_AMBULATORY_CARE_PROVIDER_SITE_OTHER): Payer: Medicare Other | Admitting: *Deleted

## 2016-08-26 DIAGNOSIS — I4892 Unspecified atrial flutter: Secondary | ICD-10-CM

## 2016-08-26 DIAGNOSIS — G459 Transient cerebral ischemic attack, unspecified: Secondary | ICD-10-CM

## 2016-08-26 DIAGNOSIS — Z5181 Encounter for therapeutic drug level monitoring: Secondary | ICD-10-CM | POA: Diagnosis not present

## 2016-08-26 LAB — POCT INR: INR: 1.9

## 2016-09-11 ENCOUNTER — Encounter (HOSPITAL_COMMUNITY): Payer: Self-pay | Admitting: Emergency Medicine

## 2016-09-11 ENCOUNTER — Emergency Department (HOSPITAL_COMMUNITY)
Admission: EM | Admit: 2016-09-11 | Discharge: 2016-09-12 | Disposition: A | Discharge status: Home / Self Care | Payer: Medicare Other | Attending: Emergency Medicine | Admitting: Emergency Medicine

## 2016-09-11 DIAGNOSIS — R300 Dysuria: Secondary | ICD-10-CM | POA: Diagnosis present

## 2016-09-11 DIAGNOSIS — N3001 Acute cystitis with hematuria: Secondary | ICD-10-CM | POA: Diagnosis not present

## 2016-09-11 DIAGNOSIS — Z794 Long term (current) use of insulin: Secondary | ICD-10-CM | POA: Insufficient documentation

## 2016-09-11 DIAGNOSIS — Z7982 Long term (current) use of aspirin: Secondary | ICD-10-CM | POA: Diagnosis not present

## 2016-09-11 DIAGNOSIS — I120 Hypertensive chronic kidney disease with stage 5 chronic kidney disease or end stage renal disease: Secondary | ICD-10-CM | POA: Diagnosis not present

## 2016-09-11 DIAGNOSIS — E1122 Type 2 diabetes mellitus with diabetic chronic kidney disease: Secondary | ICD-10-CM | POA: Insufficient documentation

## 2016-09-11 DIAGNOSIS — Z79899 Other long term (current) drug therapy: Secondary | ICD-10-CM | POA: Diagnosis not present

## 2016-09-11 DIAGNOSIS — N186 End stage renal disease: Secondary | ICD-10-CM | POA: Insufficient documentation

## 2016-09-11 DIAGNOSIS — Z992 Dependence on renal dialysis: Secondary | ICD-10-CM | POA: Insufficient documentation

## 2016-09-11 LAB — URINALYSIS, ROUTINE W REFLEX MICROSCOPIC
Bilirubin Urine: NEGATIVE
Glucose, UA: >=500 mg/dL — AB
Ketones, ur: NEGATIVE mg/dL
NITRITE: NEGATIVE
SPECIFIC GRAVITY, URINE: 1.014 (ref 1.005–1.030)
pH: 8 (ref 5.0–8.0)

## 2016-09-11 LAB — CBC WITH DIFFERENTIAL/PLATELET
BASOS ABS: 0 10*3/uL (ref 0.0–0.1)
BASOS PCT: 0 %
EOS ABS: 0.2 10*3/uL (ref 0.0–0.7)
EOS PCT: 2 %
HEMATOCRIT: 38.5 % — AB (ref 39.0–52.0)
Hemoglobin: 12.9 g/dL — ABNORMAL LOW (ref 13.0–17.0)
Lymphocytes Relative: 9 %
Lymphs Abs: 0.8 10*3/uL (ref 0.7–4.0)
MCH: 31.2 pg (ref 26.0–34.0)
MCHC: 33.5 g/dL (ref 30.0–36.0)
MCV: 93 fL (ref 78.0–100.0)
MONO ABS: 0.6 10*3/uL (ref 0.1–1.0)
MONOS PCT: 7 %
NEUTROS ABS: 7.1 10*3/uL (ref 1.7–7.7)
Neutrophils Relative %: 82 %
PLATELETS: 125 10*3/uL — AB (ref 150–400)
RBC: 4.14 MIL/uL — ABNORMAL LOW (ref 4.22–5.81)
RDW: 15.4 % (ref 11.5–15.5)
WBC: 8.8 10*3/uL (ref 4.0–10.5)

## 2016-09-11 LAB — BASIC METABOLIC PANEL
Anion gap: 13 (ref 5–15)
BUN: 53 mg/dL — AB (ref 6–20)
CO2: 29 mmol/L (ref 22–32)
CREATININE: 6.5 mg/dL — AB (ref 0.61–1.24)
Calcium: 9.6 mg/dL (ref 8.9–10.3)
Chloride: 95 mmol/L — ABNORMAL LOW (ref 101–111)
GFR calc non Af Amer: 8 mL/min — ABNORMAL LOW (ref >60)
GFR, EST AFRICAN AMERICAN: 9 mL/min — AB (ref >60)
GLUCOSE: 123 mg/dL — AB (ref 65–99)
Potassium: 4.7 mmol/L (ref 3.5–5.1)
Sodium: 137 mmol/L (ref 135–145)

## 2016-09-11 NOTE — ED Provider Notes (Signed)
[facility] DEPT Provider Note   CSN: [id number] Arrival date & time: 09/11/16  2047  By signing my name below, I, Gwenlyn Fudge, attest that this documentation has been prepared under the direction and in the presence of Caryl Ada, [place]. Electronically Signed: Gwenlyn Fudge, ED Scribe. 09/11/16. 9:59 PM.  History   Chief Complaint Chief Complaint  Patient presents with  . Recurrent UTI    x 2 weeks   The history is provided by the patient. No language interpreter was used.   HPI Comments: Kenneth [name] is a 66 y.o. male with PMHx of DM, ESRD, GERD, HLD, HTN and TIA who presents to the Emergency Department complaining of gradual onset, constant, moderate burning dysuria for 2 weeks PTA. Pain is exacerbated while urinating, but he still has pain when he is not urinating. He has associated episodic hematuria and decreased urine. Pt was diagnosed with a UTI by his PCP, Dr. Armandina Gemma, and has been compliant with prescribed medications without relief to symptoms. He is on dialysis on M, W, F. He states his blood sugar levels have been normal (276) until today. Denies risk of exposure to STD.   He also complains of a area of discoloration to the penis. Pain is exacerbated with light palpation. He has an appointment on 2/13 with an endocrinologist.   Past Medical History:  Diagnosis Date  . Arthritis   . Blood disease    states that he makes too many red blood cells.  . Carpal tunnel syndrome   . Diabetes mellitus without complication ([place])   . ESRD (end stage renal disease) ([place])    on Dialysis M, W, F  . GERD (gastroesophageal reflux disease)    history  . Hyperlipidemia   . Hypertension    history of, off medication now  . Renal disorder   . Renal insufficiency   . TIA (transient ischemic attack) 03/2015    Patient Active Problem List   Diagnosis Date Noted  . Atrial flutter ([place]) 11/25/2015  . Encounter for therapeutic drug monitoring 11/25/2015  . Adjustment disorder with  mixed anxiety and depressed mood 03/31/2015  . Acute encephalopathy 03/30/2015  . Type 2 diabetes mellitus with renal complications [id number]  . ESRD on dialysis ([place])   . TIA (transient ischemic attack) 02/25/2015  . ESRD (end stage renal disease) ([place])   . Encounter for surgical aftercare following surgery of circulatory system 01/09/2015  . End stage renal disease ([place]) 10/16/2013  . Chronic kidney disease, stage IV (severe) ([place]) 08/28/2013  . Diabetes mellitus with kidney disease ([place]) 12/25/2007  . HYPERLIPIDEMIA 07/14/2006  . MORBID OBESITY 07/14/2006  . CARPAL TUNNEL SYNDROME 07/14/2006  . Hypertension 07/14/2006  . RENAL DISEASE, CHRONIC, STAGE III 07/14/2006  . NEPHROLITHIASIS 07/14/2006  . 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 DEGENERATION, DISC NOS 07/14/2006  . PROTEINURIA 07/14/2006    Past Surgical History:  Procedure Laterality Date  . AV FISTULA PLACEMENT Left 09/14/2013   Procedure: ARTERIOVENOUS (AV) [place];  Surgeon: Rosetta Posner, MD;  Location: [facility];  Service: Vascular;  Laterality: Left;  . HAND SURGERY Right    tendon and muscle repair after injury  . HERNIA REPAIR  [id number]   umbilical       Home Medications    Prior to Admission medications   Medication Sig Start Date End Date Taking? Authorizing Provider  acetaminophen (TYLENOL) 500 MG tablet Take 1,000 mg by mouth every 8 (eight) hours as needed for mild pain or moderate  pain.     Historical Provider, MD  amLODipine (NORVASC) 5 MG tablet Take 1 tablet (5 mg total) by mouth daily. 03/02/16   Herminio Commons, MD  aspirin EC 81 MG tablet Take 81 mg by mouth daily.    Historical Provider, MD  calcitRIOL (ROCALTROL) 0.25 MCG capsule Take 3 capsules (0.75 mcg total) by mouth every Monday, Wednesday, and Friday with hemodialysis. 04/02/15   Albertine Patricia, MD  calcium acetate (PHOSLO) 667 MG capsule Take 2 capsules (1,334 mg total) by mouth 3 (three) times daily with meals. 04/02/15    Silver Huguenin Elgergawy, MD  Insulin Detemir (LEVEMIR FLEXTOUCH) 100 UNIT/ML Pen Inject 30 Units into the skin every morning. 04/02/15   Silver Huguenin Elgergawy, MD  isosorbide mononitrate (IMDUR) 30 MG 24 hr tablet TAKE ONE TABLET BY MOUTH ONCE DAILY. 06/03/16   Herminio Commons, MD  metoprolol tartrate (LOPRESSOR) 25 MG tablet Take 1 tablet (25 mg total) by mouth 2 (two) times daily. 03/02/16   Herminio Commons, MD  multivitamin (RENA-VIT) TABS tablet Take 1 tablet by mouth daily.    Historical Provider, MD  simvastatin (ZOCOR) 40 MG tablet Take 40 mg by mouth daily at 6 PM.    Historical Provider, MD  warfarin (COUMADIN) 5 MG tablet TAKE 1 TABLET BY MOUTH ONE TIME ONLY AT 6PM. 06/03/16   Herminio Commons, MD    Family History Family History  Problem Relation Age of Onset  . Cancer Father   . Diabetes Father   . Heart disease Father     Heart Disease before age 50  . Heart attack Father   . Glaucoma Mother   . Hypertension Daughter   . Hyperlipidemia Son     Social History Social History  Substance Use Topics  . Smoking status: Never Smoker  . Smokeless tobacco: Never Used  . Alcohol use No     Comment: quit 1977     Allergies   Penicillins   Review of Systems Review of Systems  Genitourinary: Positive for decreased urine volume, difficulty urinating, discharge, dysuria, hematuria and penile pain.  Skin: Positive for rash.  All other systems reviewed and are negative.  Physical Exam Updated Vital Signs BP 138/79 (BP Location: Right Arm)   Pulse 105   Temp 97.5 F (36.4 C) (Oral)   Resp 18   Ht 5\' 8"  (1.727 m)   Wt 212 lb (96.2 kg)   SpO2 98%   BMI 32.23 kg/m   Physical Exam  Constitutional: He is oriented to person, place, and time. He appears well-developed and well-nourished. He is active. No distress.  HENT:  Head: Normocephalic and atraumatic.  Eyes: Conjunctivae are normal.  Cardiovascular: Normal rate.   Pulmonary/Chest: Effort normal. No respiratory  distress.  Genitourinary:  Genitourinary Comments: Purplish, bluish, 4 mm area of discoloration about 11 o'clock  Thick white discharge underneath the foreskin  Musculoskeletal: Normal range of motion.  Neurological: He is alert and oriented to person, place, and time.  Skin: Skin is warm and dry.  Psychiatric: He has a normal mood and affect. His behavior is normal.  Nursing note and vitals reviewed.  ED Treatments / Results  DIAGNOSTIC STUDIES: Oxygen Saturation is 98% on RA, normal by my interpretation.    COORDINATION OF CARE: 9:45 PM Discussed treatment plan with pt at bedside which includes Urinalysis, CBC and pt agreed to plan.  Labs (all labs ordered are listed, but only abnormal results are displayed) Labs Reviewed - No  data to display  EKG  EKG Interpretation None       Radiology No results found.  Procedures Procedures (including critical care time)  Medications Ordered in ED Medications - No data to display   Initial Impression / Assessment and Plan / ED Course  I have reviewed the triage vital signs and the nursing notes.  Pertinent labs & imaging results that were available during my care of the patient were reviewed by me and considered in my medical decision making (see chart for details).     Pt makes only small amount of urine.  Urine shows wbc's and moderate hemoglobin.  Final Clinical Impressions(s) / ED Diagnoses   Final diagnoses:  Acute cystitis with hematuria    New Prescriptions New Prescriptions   CEPHALEXIN (KEFLEX) 500 MG CAPSULE    Take 1 capsule (500 mg total) by mouth 2 (two) times daily.   HYDROCODONE-ACETAMINOPHEN (NORCO/VICODIN) 5-325 MG TABLET    Take 2 tablets by mouth every 4 (four) hours as needed.     I personally performed the services in this documentation, which was scribed in my presence.  The recorded information has been reviewed and considered.   Ronnald Collum.   [place], PA-C 09/12/16 [place], DO 09/15/16 1344

## 2016-09-11 NOTE — ED Triage Notes (Signed)
UTI x 2 weeks, Dr Armandina Gemma has been treating

## 2016-09-11 NOTE — ED Triage Notes (Signed)
Pt takes dialysis M,W,F

## 2016-09-11 NOTE — ED Notes (Signed)
Pt reports he sometimes creates urine, has blood in urine. States "itching" and penile pain. Denies fever, V/D/.  Had full dialysis tx yesterday.

## 2016-09-12 DIAGNOSIS — N3001 Acute cystitis with hematuria: Secondary | ICD-10-CM | POA: Diagnosis not present

## 2016-09-12 MED ORDER — CEPHALEXIN 500 MG PO CAPS: 500.0000 mg | ORAL_CAPSULE | Freq: Two times a day (BID) | ORAL | 0 refills | Status: DC

## 2016-09-12 MED ORDER — HYDROCODONE-ACETAMINOPHEN 5-325 MG PO TABS: 2.0000 | ORAL_TABLET | ORAL | 0 refills | Status: DC | PRN

## 2016-09-12 MED ORDER — CEPHALEXIN 500 MG PO CAPS
500.0000 mg | ORAL_CAPSULE | Freq: Once | ORAL | Status: AC
  Administered 2016-09-12: 500 mg via ORAL
  Filled 2016-09-12: qty 1

## 2016-09-13 MED FILL — Hydrocodone-Acetaminophen Tab 5-325 MG: ORAL | Qty: 6 | Status: AC

## 2016-09-14 ENCOUNTER — Ambulatory Visit (INDEPENDENT_AMBULATORY_CARE_PROVIDER_SITE_OTHER): Payer: Medicare Other | Admitting: *Deleted

## 2016-09-14 DIAGNOSIS — Z5181 Encounter for therapeutic drug level monitoring: Secondary | ICD-10-CM

## 2016-09-14 DIAGNOSIS — G459 Transient cerebral ischemic attack, unspecified: Secondary | ICD-10-CM

## 2016-09-14 DIAGNOSIS — I4892 Unspecified atrial flutter: Secondary | ICD-10-CM

## 2016-09-14 DIAGNOSIS — G458 Other transient cerebral ischemic attacks and related syndromes: Secondary | ICD-10-CM

## 2016-09-14 LAB — POCT INR: INR: 2.8

## 2016-09-28 ENCOUNTER — Ambulatory Visit: Payer: Self-pay | Admitting: Endocrinology

## 2016-10-05 ENCOUNTER — Ambulatory Visit (INDEPENDENT_AMBULATORY_CARE_PROVIDER_SITE_OTHER): Payer: Medicare Other | Admitting: *Deleted

## 2016-10-05 ENCOUNTER — Other Ambulatory Visit: Payer: Self-pay | Admitting: Urology

## 2016-10-05 DIAGNOSIS — Z5181 Encounter for therapeutic drug level monitoring: Secondary | ICD-10-CM | POA: Diagnosis not present

## 2016-10-05 DIAGNOSIS — G459 Transient cerebral ischemic attack, unspecified: Secondary | ICD-10-CM

## 2016-10-05 DIAGNOSIS — I4892 Unspecified atrial flutter: Secondary | ICD-10-CM | POA: Diagnosis not present

## 2016-10-05 LAB — POCT INR: INR: 5.5

## 2016-10-05 NOTE — Progress Notes (Signed)
Please place orders in EPIC as patient is being scheduled for a Pre-op appointment! Thank you! 

## 2016-10-07 ENCOUNTER — Ambulatory Visit (INDEPENDENT_AMBULATORY_CARE_PROVIDER_SITE_OTHER): Payer: Medicare Other | Admitting: *Deleted

## 2016-10-07 DIAGNOSIS — Z5181 Encounter for therapeutic drug level monitoring: Secondary | ICD-10-CM

## 2016-10-07 DIAGNOSIS — G459 Transient cerebral ischemic attack, unspecified: Secondary | ICD-10-CM

## 2016-10-07 DIAGNOSIS — I4892 Unspecified atrial flutter: Secondary | ICD-10-CM | POA: Diagnosis not present

## 2016-10-07 LAB — POCT INR: INR: 3.9

## 2016-10-07 MED ORDER — ENOXAPARIN SODIUM 100 MG/ML ~~LOC~~ SOLN: 100.0000 mg | SUBCUTANEOUS | 1 refills | Status: DC

## 2016-10-07 NOTE — Patient Instructions (Signed)
Labs:  09/11/16  SCr 6.50  CrCl 15.42  Hgb 12.9  Hct 38.5  Lovenox 100mg  SQ at 6am daily.  (Renal dose)  2/22  No coumadin or lovenox  INR 3.9 2/23  No lovenox or coumadin 2/24  No lovenox or coumadin 2/25  Lovenox 100mg  sq @ 6am 2/26  Lovenox 100mg  sq @ 6am 2/27  Lovenox 100mg  sq @ 6am 2/28  Lovenox 100mg  sq @ 6am 3/1  No Lovenox-----procedure----admit to hospital

## 2016-10-11 NOTE — Progress Notes (Signed)
LOV cardio Bronson Ing 03-02-16 epic Stress Test 11-04-15 epic ECHO 02-26-15 epic EKG 03-18-16 epic

## 2016-10-11 NOTE — Patient Instructions (Addendum)
Dicky Chace  10/11/2016   Your procedure is scheduled on: 10-14-16  Report to [facility]  Entrance take [facility]  elevators to 3rd floor to  [place] [facility] [place].  Call this number if you have problems the morning of surgery [phone]   Remember: ONLY 1 PERSON MAY GO WITH YOU TO SHORT STAY TO GET  READY MORNING OF [place].  Do not eat food or drink liquids :After Midnight.     Take these medicines the morning of surgery with A SIP OF WATER: metoprolol(lopressor), isosorbide mononitrate( IMDUR),  hydrocodone as needed, oxycodone IR if needed ,                                  You may not have any metal on your body including hair pins and              piercings  Do not wear jewelry, make-up, lotions, powders or perfumes, deodorant             Do not wear nail polish.  Do not shave  48 hours prior to surgery.              Men may shave face and neck.   Do not bring valuables to the hospital. [place].  Contacts, dentures or bridgework may not be worn into surgery.  Leave suitcase in the car. After surgery it may be brought to your room.               Please read over the following fact sheets you were given: _____________________________________________________________________             How to Manage Your Diabetes Before and After Surgery  Why is it important to control my blood sugar before and after surgery? . Improving blood sugar levels before and after surgery helps healing and can limit problems. . A way of improving blood sugar control is eating a healthy diet by: o  Eating less sugar and carbohydrates o  Increasing activity/exercise o  Talking with your doctor about reaching your blood sugar goals . High blood sugars (greater than 180 mg/dL) can raise your risk of infections and slow your recovery, so you will need to focus on controlling your diabetes during the weeks before  surgery. . Make sure that the doctor who takes care of your diabetes knows about your planned surgery including the date and location.  How do I manage my blood sugar before surgery? . Check your blood sugar at least 4 times a day, starting 2 days before surgery, to make sure that the level is not too high or low. o Check your blood sugar the morning of your surgery when you wake up and every 2 hours until you get to the Short Stay unit. . If your blood sugar is less than 70 mg/dL, you will need to treat for low blood sugar: o Do not take insulin. o Treat a low blood sugar (less than 70 mg/dL) with  cup of clear juice (cranberry or apple), 4 glucose tablets, OR glucose gel. o Recheck blood sugar in 15 minutes after treatment (to make sure it is greater than 70 mg/dL). If your blood sugar is not  greater than 70 mg/dL on recheck, call [phone] for further instructions. . Report your blood sugar to the short stay nurse when you get to Short Stay.  . If you are admitted to the hospital after surgery: o Your blood sugar will be checked by the staff and you will probably be given insulin after surgery (instead of oral diabetes medicines) to make sure you have good blood sugar levels. o The goal for blood sugar control after surgery is 80-180 mg/dL.   WHAT DO I DO ABOUT MY DIABETES MEDICATION?   . THE DAY BEFORE SURGERY, take  Your Levemir  80 units insulin as usual.      . THE MORNING OF SURGERY, take  units of     Levemir  40 units   insulin.  Patient Signature:  Date:   Nurse Signature:  Date:   Reviewed and Endorsed by [facility] Patient Education Committee, August 2015   [facility] - Preparing for Surgery Before surgery, you can play an important role.  Because skin is not sterile, your skin needs to be as free of germs as possible.  You can reduce the number of germs on your skin by washing with CHG (chlorahexidine gluconate) soap before surgery.  CHG is an antiseptic cleaner  which kills germs and bonds with the skin to continue killing germs even after washing. Please DO NOT use if you have an allergy to CHG or antibacterial soaps.  If your skin becomes reddened/irritated stop using the CHG and inform your nurse when you arrive at Short Stay. Do not shave (including legs and underarms) for at least 48 hours prior to the first CHG shower.  You may shave your face/neck. Please follow these instructions carefully:  1.  Shower with CHG Soap the night before surgery and the  morning of Surgery.  2.  If you choose to wash your hair, wash your hair first as usual with your  normal  shampoo.  3.  After you shampoo, rinse your hair and body thoroughly to remove the  shampoo.                           4.  Use CHG as you would any other liquid soap.  You can apply chg directly  to the skin and wash                       Gently with a scrungie or clean washcloth.  5.  Apply the CHG Soap to your body ONLY FROM THE NECK DOWN.   Do not use on face/ open                           Wound or open sores. Avoid contact with eyes, ears mouth and genitals (private parts).                       Wash face,  Genitals (private parts) with your normal soap.             6.  Wash thoroughly, paying special attention to the area where your surgery  will be performed.  7.  Thoroughly rinse your body with warm water from the neck down.  8.  DO NOT shower/wash with your normal soap after using and rinsing off  the CHG Soap.  9.  Pat yourself dry with a clean towel.            10.  Wear clean pajamas.            11.  Place clean sheets on your bed the night of your first shower and do not  sleep with pets. Day of Surgery : Do not apply any lotions/deodorants the morning of surgery.  Please wear clean clothes to the hospital/surgery center.  FAILURE TO FOLLOW THESE INSTRUCTIONS MAY RESULT IN THE CANCELLATION OF YOUR SURGERY PATIENT SIGNATURE_________________________________  NURSE  SIGNATURE__________________________________  ________________________________________________________________________

## 2016-10-12 ENCOUNTER — Encounter (INDEPENDENT_AMBULATORY_CARE_PROVIDER_SITE_OTHER): Payer: Self-pay

## 2016-10-12 ENCOUNTER — Encounter (HOSPITAL_COMMUNITY)
Admission: RE | Admit: 2016-10-12 | Discharge: 2016-10-12 | Disposition: A | Discharge status: Home / Self Care | Payer: Medicare Other | Source: Ambulatory Visit | Attending: Urology | Admitting: Urology

## 2016-10-12 ENCOUNTER — Encounter (HOSPITAL_COMMUNITY): Payer: Self-pay

## 2016-10-12 ENCOUNTER — Encounter (HOSPITAL_COMMUNITY): Payer: Self-pay | Admitting: *Deleted

## 2016-10-12 DIAGNOSIS — N186 End stage renal disease: Secondary | ICD-10-CM | POA: Diagnosis not present

## 2016-10-12 DIAGNOSIS — Z79899 Other long term (current) drug therapy: Secondary | ICD-10-CM | POA: Diagnosis not present

## 2016-10-12 DIAGNOSIS — Z794 Long term (current) use of insulin: Secondary | ICD-10-CM | POA: Diagnosis not present

## 2016-10-12 DIAGNOSIS — E1122 Type 2 diabetes mellitus with diabetic chronic kidney disease: Secondary | ICD-10-CM | POA: Diagnosis not present

## 2016-10-12 DIAGNOSIS — K219 Gastro-esophageal reflux disease without esophagitis: Secondary | ICD-10-CM | POA: Diagnosis not present

## 2016-10-12 DIAGNOSIS — Z992 Dependence on renal dialysis: Secondary | ICD-10-CM | POA: Diagnosis not present

## 2016-10-12 DIAGNOSIS — I4891 Unspecified atrial fibrillation: Secondary | ICD-10-CM | POA: Diagnosis not present

## 2016-10-12 DIAGNOSIS — Z841 Family history of disorders of kidney and ureter: Secondary | ICD-10-CM | POA: Diagnosis not present

## 2016-10-12 DIAGNOSIS — Z7982 Long term (current) use of aspirin: Secondary | ICD-10-CM | POA: Diagnosis not present

## 2016-10-12 DIAGNOSIS — N4889 Other specified disorders of penis: Secondary | ICD-10-CM | POA: Diagnosis not present

## 2016-10-12 DIAGNOSIS — Z7901 Long term (current) use of anticoagulants: Secondary | ICD-10-CM | POA: Diagnosis not present

## 2016-10-12 DIAGNOSIS — E669 Obesity, unspecified: Secondary | ICD-10-CM | POA: Diagnosis not present

## 2016-10-12 DIAGNOSIS — Z79891 Long term (current) use of opiate analgesic: Secondary | ICD-10-CM | POA: Diagnosis not present

## 2016-10-12 DIAGNOSIS — Z6831 Body mass index (BMI) 31.0-31.9, adult: Secondary | ICD-10-CM | POA: Diagnosis not present

## 2016-10-12 DIAGNOSIS — I120 Hypertensive chronic kidney disease with stage 5 chronic kidney disease or end stage renal disease: Secondary | ICD-10-CM | POA: Diagnosis not present

## 2016-10-12 DIAGNOSIS — Z8673 Personal history of transient ischemic attack (TIA), and cerebral infarction without residual deficits: Secondary | ICD-10-CM | POA: Diagnosis not present

## 2016-10-12 DIAGNOSIS — N359 Urethral stricture, unspecified: Secondary | ICD-10-CM | POA: Diagnosis not present

## 2016-10-12 DIAGNOSIS — I861 Scrotal varices: Secondary | ICD-10-CM | POA: Diagnosis not present

## 2016-10-12 DIAGNOSIS — I252 Old myocardial infarction: Secondary | ICD-10-CM | POA: Diagnosis not present

## 2016-10-12 HISTORY — DX: Headache, unspecified: R51.9

## 2016-10-12 HISTORY — DX: Other complications of anesthesia, initial encounter: T88.59XA

## 2016-10-12 HISTORY — DX: Adverse effect of unspecified anesthetic, initial encounter: T41.45XA

## 2016-10-12 HISTORY — DX: Claustrophobia: F40.240

## 2016-10-12 HISTORY — DX: Headache: R51

## 2016-10-12 LAB — GLUCOSE, CAPILLARY: Glucose-Capillary: 132 mg/dL — ABNORMAL HIGH (ref 65–99)

## 2016-10-12 NOTE — Progress Notes (Addendum)
CARDIAC CLEARANCE NOTE DR Bronson Ing ON CHART  EKG 03-18-16 EPIC STRESS TEST 11-04-15 EPIC ECHO 02-26-15 EPIC lov dr Ivonne Andrew 03-02-16 epic

## 2016-10-12 NOTE — Progress Notes (Signed)
   10/12/16 1304  OBSTRUCTIVE SLEEP APNEA  Have you ever been diagnosed with sleep apnea through a sleep study? No  Do you snore loudly (loud enough to be heard through closed doors)?  1  Do you often feel tired, fatigued, or sleepy during the daytime (such as falling asleep during driving or talking to someone)? 1  Has anyone observed you stop breathing during your sleep? 0  Do you have, or are you being treated for high blood pressure? 1  BMI more than 35 kg/m2? 0  Age > 50 (1-yes) 1  Neck circumference greater than:Male 16 inches or larger, Male 17inches or larger? 0  Male Gender (Yes=1) 1  Obstructive Sleep Apnea Score 5

## 2016-10-13 LAB — HEMOGLOBIN A1C
HEMOGLOBIN A1C: 7.1 % — AB (ref 4.8–5.6)
MEAN PLASMA GLUCOSE: 157 mg/dL

## 2016-10-13 NOTE — H&P (Signed)
CC/HPI: CC: Penile lesion.   Hx: Kenneth [name] is a 66 yo WM who is sent by Dr. Hilma Favors for a painful penile lesion that he has been dealing with for about 3-4 weeks. The lesion is on the head of the penis and is about 2 cm. It is very painful. He has been treated with amoxicillin and keflex but none of that helped. He has been using OTC cream which hasn't helped. He is not sexually active. He is uncircumcised. 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 He is oliguric but voids ok. He has had no inguinal adenopathy. He has had no fever. He has no neuropathy and has had no treatment for vascular disease.     ALLERGIES: Penicillins    MEDICATIONS: Aspirin 81 mg tablet, chewable  Metoprolol Tartrate 25 mg tablet  Warfarin Sodium 5 mg tablet  Hydrocodone-Acetaminophen 5 mg-325 mg tablet  Isosorbide Mononitrate Er 30 mg tablet, extended release 24 hr  Levemir Flextouch 100 unit/ml (3 ml) insulin pen  Rena-Vite     GU PSH: None   NON-GU PSH: Hernia Repair    GU PMH: None     PMH Notes: Renal failure on dialysis almost 3 years   NON-GU PMH: Arthritis Dependence on renal dialysis Diabetes Type 2 Heart disease, unspecified Hypercholesterolemia Hypertension Myocardial Infarction Stroke/TIA    FAMILY HISTORY: Cancer - Father Hernia - Mother nephrolithiasis - Father   SOCIAL HISTORY: Marital Status: Divorced Current Smoking Status: Patient has never smoked.   Tobacco Use Assessment Completed: Used Tobacco in last 30 days? Has never drank.  Drinks 3 caffeinated drinks per day. Patient's occupation is/was retired.     Notes: 1 son 2 daughters   REVIEW OF SYSTEMS:    GU Review Male:   Patient reports burning /pain with urination, stream starts and stops, and trouble starting your stream. Patient denies frequent urination, hard to postpone urination, get up at night to urinate, leakage of urine, have to strain to urinate, and currently pregnant.  Gastrointestinal (Upper):   occasional post  prandial nv or diarrhea. Patient reports nausea, vomiting, and indigestion/ heartburn.   Gastrointestinal (Lower):   Patient reports diarrhea and constipation.   Constitutional:   Patient reports weight loss and fatigue. Patient denies fever and night sweats.  Skin:   Patient reports itching. Patient denies skin rash/ lesion.  Eyes:   Patient denies blurred vision and double vision.  Ears/ Nose/ Throat:   Patient reports sore throat and sinus problems.   Hematologic/Lymphatic:   Patient denies swollen glands and easy bruising.  Cardiovascular:   Patient reports leg swelling and chest pains.   Respiratory:   Patient reports shortness of breath. Patient denies cough.  Endocrine:   Patient reports excessive thirst.   Musculoskeletal:   Patient reports back pain and joint pain.   Neurological:   Patient denies dizziness and headaches.  Psychologic:   Patient denies depression and anxiety.   VITAL SIGNS:      09/30/2016 12:39 PM  Weight 208 lb / 94.35 kg  Height 68 in / 172.72 cm  BP 112/70 mmHg  Pulse 88 /min  Temperature 98.0 F / 37 C  BMI 31.6 kg/m   GU PHYSICAL EXAMINATION:    Scrotum: No lesions. No edema. No cysts. No warts.  Epididymides: Right: no spermatocele, no masses, no cysts, no tenderness, no induration, no enlargement. Left: no spermatocele, no masses, no cysts, no tenderness, no induration, no enlargement.  Testes: Lg varicocele - Gr III right testis. No  tenderness, no swelling, no enlargement left testis. No tenderness, no swelling, no enlargement right testis. Normal location left testis. Normal location right testis. No mass, no cyst, no varicocele, no hydrocele left testis. No mass, no cyst, no hydrocele right testis.   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 He has an eschar on the tip of the glans with some meatal stenosis and it looks most consistent with necrosis from vascular insufficiency.     MULTI-SYSTEM PHYSICAL EXAMINATION:    Constitutional: Well-nourished. No physical deformities. Normally developed. Good grooming.  Neck: Neck symmetrical, not swollen. Normal tracheal position.  Respiratory: No labored breathing, no use of accessory muscles. CTA  Cardiovascular: Normal temperature, RRR without murmur. bilateral ankle edema. RUE fistula..   Lymphatic: No enlargement of neck, axillae, groin.  Skin: No paleness, no jaundice, no cyanosis. No lesion, no ulcer, no rash.  Neurologic / Psychiatric: Oriented to time, oriented to place, oriented to person. No depression, no anxiety, no agitation.  Gastrointestinal: Obese abdomen. No hernia. No mass, no tenderness, no rigidity.   Musculoskeletal: Normal gait and station of head and neck.     PAST DATA REVIEWED:  Source Of History:  Patient  Lab Test Review:   BMP  Records Review:   Previous Doctor Records, Previous Hospital Records  Urine Test Review:   Urinalysis  X-Ray Review: C.T. Stone Protocol: Reviewed Films. Reviewed Report. Discussed With Patient. see procedure note.    Notes:                     I have reviewed records from Dr. Gerarda Fraction and from the ER. His UA had TNTC RBC's and he was treated for a UTI. Cr was 6.5   PROCEDURES:         C.T. 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 Liposarcoma is less likely. A Contrast CT was recommended to assess vascularity.                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   2   Asymptomatic microscopic hematuria - R31.21 He has hematuria that could be from the glans lesion but w/u is indicated.   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   5   Benign Neo Left Kidney - D30.02 He has few fat density masses adjacent to the kidney.    PLAN:  Medications New Meds: Hydrocodone-Acetaminophen 5 mg-325 mg tablet 1 tablet PO Q 6 H PRN   #20  0 Refill(s)            Orders X-Rays: C.T. Stone Protocol Without Contrast  X-Ray Notes: History:  Hematuria: Yes/No  Patient to see MD after exam: Yes/No  Previous exam: CT / IVP/ US/ KUB/ None  When:  Where:  Diabetic: Yes/ No  BUN/ Creatinine:  Date of last BUN Creatinine:  Weight in pounds:  Allergy- IV Contrast: Yes/ No  Conflicting diabetic meds: Yes/ No  Diabetic Meds:  Prior Authorization #:[id number]            Schedule X-Rays: C.T. Abdomen With I.V. Contrast - Next available. He is on dialysis so creatinine is not necessary.   Return Visit/Planned Activity: Next Available Appointment - Schedule Surgery          Document Letter(s):  Created for Patient: Clinical Summary         Notes:   I am going to get him set up for a circumcision and debridement of the glanular necrosis per my research on his condition. I have reviewed the risks of bleeding,infection, loss of penile tissue, urethral stricturing, thrombotic events and anesthetic complications. I will do cystoscopy as well.   I will get a CT with contrast to better assess the perirenal lesions.   CC: Dr. Sharilyn Sites.

## 2016-10-14 ENCOUNTER — Ambulatory Visit (HOSPITAL_COMMUNITY)
Admission: RE | Admit: 2016-10-14 | Discharge: 2016-10-14 | Disposition: A | Discharge status: Other Acute Inpatient Hospital | Payer: Medicare Other | Source: Ambulatory Visit | Attending: Urology | Admitting: Urology

## 2016-10-14 ENCOUNTER — Ambulatory Visit (HOSPITAL_COMMUNITY): Payer: Medicare Other | Admitting: Anesthesiology

## 2016-10-14 ENCOUNTER — Encounter (HOSPITAL_COMMUNITY)
Admission: RE | Disposition: A | Discharge status: Other Acute Inpatient Hospital | Payer: Self-pay | Source: Ambulatory Visit | Attending: Urology

## 2016-10-14 ENCOUNTER — Encounter (HOSPITAL_COMMUNITY): Payer: Self-pay | Admitting: *Deleted

## 2016-10-14 ENCOUNTER — Ambulatory Visit: Payer: Self-pay | Admitting: Endocrinology

## 2016-10-14 DIAGNOSIS — Z79891 Long term (current) use of opiate analgesic: Secondary | ICD-10-CM | POA: Insufficient documentation

## 2016-10-14 DIAGNOSIS — N359 Urethral stricture, unspecified: Secondary | ICD-10-CM | POA: Insufficient documentation

## 2016-10-14 DIAGNOSIS — N4889 Other specified disorders of penis: Secondary | ICD-10-CM | POA: Insufficient documentation

## 2016-10-14 DIAGNOSIS — E669 Obesity, unspecified: Secondary | ICD-10-CM | POA: Insufficient documentation

## 2016-10-14 DIAGNOSIS — K219 Gastro-esophageal reflux disease without esophagitis: Secondary | ICD-10-CM | POA: Insufficient documentation

## 2016-10-14 DIAGNOSIS — Z7982 Long term (current) use of aspirin: Secondary | ICD-10-CM | POA: Diagnosis not present

## 2016-10-14 DIAGNOSIS — Z794 Long term (current) use of insulin: Secondary | ICD-10-CM | POA: Insufficient documentation

## 2016-10-14 DIAGNOSIS — E1122 Type 2 diabetes mellitus with diabetic chronic kidney disease: Secondary | ICD-10-CM | POA: Insufficient documentation

## 2016-10-14 DIAGNOSIS — I861 Scrotal varices: Secondary | ICD-10-CM | POA: Insufficient documentation

## 2016-10-14 DIAGNOSIS — I120 Hypertensive chronic kidney disease with stage 5 chronic kidney disease or end stage renal disease: Secondary | ICD-10-CM | POA: Insufficient documentation

## 2016-10-14 DIAGNOSIS — I252 Old myocardial infarction: Secondary | ICD-10-CM | POA: Insufficient documentation

## 2016-10-14 DIAGNOSIS — Z8673 Personal history of transient ischemic attack (TIA), and cerebral infarction without residual deficits: Secondary | ICD-10-CM | POA: Insufficient documentation

## 2016-10-14 DIAGNOSIS — I4891 Unspecified atrial fibrillation: Secondary | ICD-10-CM | POA: Insufficient documentation

## 2016-10-14 DIAGNOSIS — Z79899 Other long term (current) drug therapy: Secondary | ICD-10-CM | POA: Insufficient documentation

## 2016-10-14 DIAGNOSIS — N186 End stage renal disease: Secondary | ICD-10-CM | POA: Insufficient documentation

## 2016-10-14 DIAGNOSIS — Z841 Family history of disorders of kidney and ureter: Secondary | ICD-10-CM | POA: Insufficient documentation

## 2016-10-14 DIAGNOSIS — Z6831 Body mass index (BMI) 31.0-31.9, adult: Secondary | ICD-10-CM | POA: Insufficient documentation

## 2016-10-14 DIAGNOSIS — Z992 Dependence on renal dialysis: Secondary | ICD-10-CM | POA: Insufficient documentation

## 2016-10-14 DIAGNOSIS — Z7901 Long term (current) use of anticoagulants: Secondary | ICD-10-CM | POA: Insufficient documentation

## 2016-10-14 HISTORY — PX: CIRCUMCISION: SHX1350

## 2016-10-14 LAB — BASIC METABOLIC PANEL
ANION GAP: 15 (ref 5–15)
BUN: 40 mg/dL — AB (ref 6–20)
CALCIUM: 9.5 mg/dL (ref 8.9–10.3)
CO2: 28 mmol/L (ref 22–32)
Chloride: 95 mmol/L — ABNORMAL LOW (ref 101–111)
Creatinine, Ser: 5.91 mg/dL — ABNORMAL HIGH (ref 0.61–1.24)
GFR calc Af Amer: 10 mL/min — ABNORMAL LOW (ref >60)
GFR, EST NON AFRICAN AMERICAN: 9 mL/min — AB (ref >60)
GLUCOSE: 135 mg/dL — AB (ref 65–99)
Potassium: 3.6 mmol/L (ref 3.5–5.1)
Sodium: 138 mmol/L (ref 135–145)

## 2016-10-14 LAB — CBC
HEMATOCRIT: 34.3 % — AB (ref 39.0–52.0)
HEMOGLOBIN: 11.5 g/dL — AB (ref 13.0–17.0)
MCH: 30.4 pg (ref 26.0–34.0)
MCHC: 33.5 g/dL (ref 30.0–36.0)
MCV: 90.7 fL (ref 78.0–100.0)
Platelets: 161 10*3/uL (ref 150–400)
RBC: 3.78 MIL/uL — ABNORMAL LOW (ref 4.22–5.81)
RDW: 15.9 % — AB (ref 11.5–15.5)
WBC: 7.3 10*3/uL (ref 4.0–10.5)

## 2016-10-14 LAB — GLUCOSE, CAPILLARY
GLUCOSE-CAPILLARY: 125 mg/dL — AB (ref 65–99)
Glucose-Capillary: 158 mg/dL — ABNORMAL HIGH (ref 65–99)

## 2016-10-14 LAB — PROTIME-INR
INR: 1.39
PROTHROMBIN TIME: 17.2 s — AB (ref 11.4–15.2)

## 2016-10-14 SURGERY — CIRCUMCISION, ADULT
Anesthesia: General | Site: Penis

## 2016-10-14 MED ORDER — STERILE WATER FOR IRRIGATION IR SOLN
Status: DC | PRN
  Administered 2016-10-14: 3000 mL via INTRAVESICAL

## 2016-10-14 MED ORDER — PHENYLEPHRINE HCL 10 MG/ML IJ SOLN
INTRAMUSCULAR | Status: DC | PRN
  Administered 2016-10-14 (×2): 40 ug via INTRAVENOUS

## 2016-10-14 MED ORDER — METOCLOPRAMIDE HCL 5 MG/ML IJ SOLN: 10.0000 mg | Freq: Once | INTRAMUSCULAR | Status: DC | PRN

## 2016-10-14 MED ORDER — SODIUM CHLORIDE 0.9 % IV SOLN
INTRAVENOUS | Status: DC
  Administered 2016-10-14: 08:00:00 via INTRAVENOUS

## 2016-10-14 MED ORDER — ACETAMINOPHEN 650 MG RE SUPP
650.0000 mg | RECTAL | Status: DC | PRN
  Filled 2016-10-14: qty 1

## 2016-10-14 MED ORDER — SUCCINYLCHOLINE CHLORIDE 200 MG/10ML IV SOSY
PREFILLED_SYRINGE | INTRAVENOUS | Status: AC
  Filled 2016-10-14: qty 10

## 2016-10-14 MED ORDER — ACETAMINOPHEN 325 MG PO TABS: 650.0000 mg | ORAL_TABLET | ORAL | Status: DC | PRN

## 2016-10-14 MED ORDER — PROPOFOL 10 MG/ML IV BOLUS
INTRAVENOUS | Status: DC | PRN
  Administered 2016-10-14: 150 mg via INTRAVENOUS

## 2016-10-14 MED ORDER — LIDOCAINE HCL (CARDIAC) 20 MG/ML IV SOLN
INTRAVENOUS | Status: DC | PRN
  Administered 2016-10-14: 100 mg via INTRAVENOUS

## 2016-10-14 MED ORDER — BUPIVACAINE HCL (PF) 0.25 % IJ SOLN
INTRAMUSCULAR | Status: AC
  Filled 2016-10-14: qty 30

## 2016-10-14 MED ORDER — PROPOFOL 10 MG/ML IV BOLUS
INTRAVENOUS | Status: AC
  Filled 2016-10-14: qty 20

## 2016-10-14 MED ORDER — SODIUM CHLORIDE 0.9% FLUSH: 3.0000 mL | Freq: Two times a day (BID) | INTRAVENOUS | Status: DC

## 2016-10-14 MED ORDER — LIDOCAINE 2% (20 MG/ML) 5 ML SYRINGE
INTRAMUSCULAR | Status: AC
  Filled 2016-10-14: qty 5

## 2016-10-14 MED ORDER — BACITRACIN-NEOMYCIN-POLYMYXIN 400-5-5000 EX OINT
TOPICAL_OINTMENT | CUTANEOUS | Status: AC
  Filled 2016-10-14: qty 1

## 2016-10-14 MED ORDER — SODIUM CHLORIDE 0.9 % IV SOLN: 250.0000 mL | INTRAVENOUS | Status: DC | PRN

## 2016-10-14 MED ORDER — FENTANYL CITRATE (PF) 100 MCG/2ML IJ SOLN
INTRAMUSCULAR | Status: DC | PRN
  Administered 2016-10-14 (×2): 50 ug via INTRAVENOUS

## 2016-10-14 MED ORDER — ONDANSETRON HCL 4 MG/2ML IJ SOLN
INTRAMUSCULAR | Status: AC
  Filled 2016-10-14: qty 2

## 2016-10-14 MED ORDER — ESMOLOL HCL 100 MG/10ML IV SOLN
INTRAVENOUS | Status: DC | PRN
  Administered 2016-10-14 (×2): 30 mg via INTRAVENOUS

## 2016-10-14 MED ORDER — OXYCODONE HCL 5 MG PO TABS: 5.0000 mg | ORAL_TABLET | Freq: Four times a day (QID) | ORAL | 0 refills | Status: DC | PRN

## 2016-10-14 MED ORDER — SODIUM CHLORIDE 0.9% FLUSH: 3.0000 mL | INTRAVENOUS | Status: DC | PRN

## 2016-10-14 MED ORDER — BUPIVACAINE HCL (PF) 0.25 % IJ SOLN
INTRAMUSCULAR | Status: DC | PRN
  Administered 2016-10-14: 7 mL

## 2016-10-14 MED ORDER — METOPROLOL TARTRATE 5 MG/5ML IV SOLN
INTRAVENOUS | Status: AC
  Filled 2016-10-14: qty 5

## 2016-10-14 MED ORDER — FENTANYL CITRATE (PF) 100 MCG/2ML IJ SOLN
INTRAMUSCULAR | Status: AC
  Filled 2016-10-14: qty 2

## 2016-10-14 MED ORDER — METOPROLOL TARTRATE 5 MG/5ML IV SOLN
INTRAVENOUS | Status: DC | PRN
  Administered 2016-10-14: 2.5 mg via INTRAVENOUS

## 2016-10-14 MED ORDER — CEFAZOLIN SODIUM-DEXTROSE 2-4 GM/100ML-% IV SOLN
2.0000 g | Freq: Once | INTRAVENOUS | Status: AC
  Administered 2016-10-14: 2 g via INTRAVENOUS
  Filled 2016-10-14: qty 100

## 2016-10-14 MED ORDER — OXYCODONE HCL 5 MG PO TABS: 5.0000 mg | ORAL_TABLET | ORAL | Status: DC | PRN

## 2016-10-14 MED ORDER — ESMOLOL HCL 100 MG/10ML IV SOLN
INTRAVENOUS | Status: AC
  Filled 2016-10-14: qty 10

## 2016-10-14 MED ORDER — FENTANYL CITRATE (PF) 100 MCG/2ML IJ SOLN: 25.0000 ug | INTRAMUSCULAR | Status: DC | PRN

## 2016-10-14 MED ORDER — ONDANSETRON HCL 4 MG/2ML IJ SOLN
INTRAMUSCULAR | Status: DC | PRN
  Administered 2016-10-14: 4 mg via INTRAVENOUS

## 2016-10-14 MED ORDER — MEPERIDINE HCL 50 MG/ML IJ SOLN: 6.2500 mg | INTRAMUSCULAR | Status: DC | PRN

## 2016-10-14 SURGICAL SUPPLY — 31 items
BANDAGE COBAN STERILE 2 (GAUZE/BANDAGES/DRESSINGS) ×3 IMPLANT
BLADE SURG 15 STRL LF DISP TIS (BLADE) ×1 IMPLANT
BLADE SURG 15 STRL SS (BLADE) ×3
BNDG CONFORM 2 STRL LF (GAUZE/BANDAGES/DRESSINGS) ×3 IMPLANT
CATH FOLEY 2WAY SLVR  5CC 16FR (CATHETERS) ×2
CATH FOLEY 2WAY SLVR 5CC 16FR (CATHETERS) IMPLANT
CATH URET 5FR 28IN OPEN ENDED (CATHETERS) IMPLANT
COVER SURGICAL LIGHT HANDLE (MISCELLANEOUS) ×3 IMPLANT
DECANTER SPIKE VIAL GLASS SM (MISCELLANEOUS) IMPLANT
DRAPE LAPAROTOMY T 102X78X121 (DRAPES) ×3 IMPLANT
ELECT NDL TIP 2.8 STRL (NEEDLE) ×1 IMPLANT
ELECT NEEDLE TIP 2.8 STRL (NEEDLE) ×3 IMPLANT
ELECT PENCIL ROCKER SW 15FT (MISCELLANEOUS) ×3 IMPLANT
ELECT REM PT RETURN 9FT ADLT (ELECTROSURGICAL) ×3
ELECTRODE REM PT RTRN 9FT ADLT (ELECTROSURGICAL) ×1 IMPLANT
GAUZE SPONGE 4X4 12PLY STRL (GAUZE/BANDAGES/DRESSINGS) ×3 IMPLANT
GAUZE SPONGE 4X4 16PLY XRAY LF (GAUZE/BANDAGES/DRESSINGS) ×3 IMPLANT
GAUZE XEROFORM 1X8 LF (GAUZE/BANDAGES/DRESSINGS) ×2 IMPLANT
GLOVE SURG SS PI 8.0 STRL IVOR (GLOVE) IMPLANT
GOWN STRL REUS W/TWL XL LVL3 (GOWN DISPOSABLE) ×3 IMPLANT
KIT BASIN OR (CUSTOM PROCEDURE TRAY) ×3 IMPLANT
NDL HYPO 25X1 1.5 SAFETY (NEEDLE) IMPLANT
NEEDLE HYPO 25X1 1.5 SAFETY (NEEDLE) IMPLANT
NS IRRIG 1000ML POUR BTL (IV SOLUTION) IMPLANT
PACK BASIC VI WITH GOWN DISP (CUSTOM PROCEDURE TRAY) ×3 IMPLANT
SET IRRIG Y TYPE TUR BLADDER L (SET/KITS/TRAYS/PACK) ×2 IMPLANT
SUT CHROMIC 4 0 P 3 18 (SUTURE) ×2 IMPLANT
SUT CHROMIC 4 0 PS 2 18 (SUTURE) ×12 IMPLANT
SYR CONTROL 10ML LL (SYRINGE) IMPLANT
TOWEL OR 17X26 10 PK STRL BLUE (TOWEL DISPOSABLE) ×3 IMPLANT
WATER STERILE IRR 1500ML POUR (IV SOLUTION) IMPLANT

## 2016-10-14 NOTE — Anesthesia Preprocedure Evaluation (Addendum)
Anesthesia Evaluation  Patient identified by MRN, date of birth, ID band Patient awake    Reviewed: Allergy & Precautions, NPO status , Patient's Chart, lab work & pertinent test results  History of Anesthesia Complications (+) history of anesthetic complications  Airway Mallampati: I  TM Distance: >3 FB Neck ROM: Full    Dental no notable dental hx. (+) Teeth Intact   Pulmonary neg pulmonary ROS,    Pulmonary exam normal breath sounds clear to auscultation       Cardiovascular hypertension, Pt. on medications and Pt. on home beta blockers Normal cardiovascular exam+ dysrhythmias Atrial Fibrillation  Rhythm:Regular Rate:Normal     Neuro/Psych  Headaches, PSYCHIATRIC DISORDERS Anxiety claustrophobiaTIA Neuromuscular disease    GI/Hepatic Neg liver ROS, GERD  Medicated and Controlled,  Endo/Other  diabetes, Well Controlled, Type 2, Insulin DependentHyperlipidemia Obesity  Renal/GU ESRF and DialysisRenal disease   Penile necrosis Hematuria    Musculoskeletal  (+) Arthritis ,   Abdominal (+) + obese,   Peds  Hematology  (+) anemia , Lovenox- last dose yesterday   Anesthesia Other Findings   Reproductive/Obstetrics                             Anesthesia Physical Anesthesia Plan  ASA: IV  Anesthesia Plan: General   Post-op Pain Management:    Induction: Intravenous  Airway Management Planned: LMA  Additional Equipment:   Intra-op Plan:   Post-operative Plan: Extubation in OR  Informed Consent: I have reviewed the patients History and Physical, chart, labs and discussed the procedure including the risks, benefits and alternatives for the proposed anesthesia with the patient or authorized representative who has indicated his/her understanding and acceptance.   Dental advisory given  Plan Discussed with: Anesthesiologist, CRNA and Surgeon  Anesthesia Plan Comments:          Anesthesia Quick Evaluation

## 2016-10-14 NOTE — Anesthesia Postprocedure Evaluation (Signed)
Anesthesia Post Note  Patient: PHUONG BARDI  Procedure(s) Performed: Procedure(s) (LRB): CYSTOSCOPY, CIRCUMCISION ADULT AND PENILE DEBRIDEMENT,MEATONOMY (N/A)  Patient location during evaluation: PACU Anesthesia Type: General Level of consciousness: awake and alert and oriented Pain management: pain level controlled Vital Signs Assessment: post-procedure vital signs reviewed and stable Respiratory status: spontaneous breathing, nonlabored ventilation and respiratory function stable Cardiovascular status: blood pressure returned to baseline and stable Postop Assessment: no signs of nausea or vomiting Anesthetic complications: no       Last Vitals:  Vitals:   10/14/16 1145 10/14/16 1158  BP: (!) 99/59 (!) 101/44  Pulse: (!) 101   Resp: 14 14  Temp: 36.6 C 36.6 C    Last Pain:  Vitals:   10/14/16 1145  TempSrc:   PainSc: 0-No pain                 Stephanie Mcglone A.

## 2016-10-14 NOTE — Brief Op Note (Signed)
10/14/2016  10:57 AM  PATIENT:  Kenneth [name]  66 y.o. male  PRE-OPERATIVE DIAGNOSIS:  penile necrosis and hematuria  POST-OPERATIVE DIAGNOSIS:  penile necrosis and hematuria  PROCEDURE:  Procedure(s): CYSTOSCOPY, CIRCUMCISION ADULT AND PENILE DEBRIDEMENT,MEATOTOMY (N/A)  SURGEON:  Surgeon(s) and Role:    * Irine Seal, MD - Primary  PHYSICIAN ASSISTANT:   ASSISTANTS: none   ANESTHESIA:   general  EBL:  Total I/O In: 500 [I.V.:500] Out: 300 [Urine:300]  BLOOD ADMINISTERED:none  DRAINS: none   LOCAL MEDICATIONS USED:  MARCAINE 0.25%    and Amount: 7 ml  SPECIMEN:  No Specimen  DISPOSITION OF SPECIMEN:  N/A  COUNTS:  YES  TOURNIQUET:  * No tourniquets in log *  DICTATION: .Other Dictation: Dictation Number [id number]  PLAN OF CARE: Discharge to home after PACU  PATIENT DISPOSITION:  PACU - hemodynamically stable.   Delay start of Pharmacological VTE agent (>24hrs) due to surgical blood loss or risk of bleeding: not applicable

## 2016-10-14 NOTE — Interval H&P Note (Signed)
History and Physical Interval Note:  No major change in exam.   10/14/2016 9:47 AM  Kenneth [name]  has presented today for surgery, with the diagnosis of penile necrosis and hemateria  The various methods of treatment have been discussed with the patient and family. After consideration of risks, benefits and other options for treatment, the patient has consented to  Procedure(s): CYSTOSCOPY, CIRCUMCISION ADULT AND PENILE DEBRIDEMENT (N/A) as a surgical intervention .  The patient's history has been reviewed, patient examined, no change in status, stable for surgery.  I have reviewed the patient's chart and labs.  Questions were answered to the patient's satisfaction.     Dhruti Ghuman J

## 2016-10-14 NOTE — Transfer of Care (Signed)
Immediate Anesthesia Transfer of Care Note  Patient: Kenneth [name]  Procedure(s) Performed: Procedure(s): CYSTOSCOPY, CIRCUMCISION ADULT AND PENILE DEBRIDEMENT,MEATONOMY (N/A)  Patient Location: PACU  Anesthesia Type:General  Level of Consciousness: sedated and patient cooperative  Airway & Oxygen Therapy: Patient Spontanous Breathing and Patient connected to face mask oxygen  Post-op Assessment: Report given to RN and Post -op Vital signs reviewed and stable  Post vital signs: Reviewed and stable  Last Vitals:  Vitals:   10/14/16 0802  BP: (!) 84/59  Pulse: 70  Resp: 16  Temp: 36.6 C    Last Pain:  Vitals:   10/14/16 0816  TempSrc:   PainSc: 5       Patients Stated Pain Goal: 4 ([id number])  Complications: No apparent anesthesia complications

## 2016-10-14 NOTE — Anesthesia Procedure Notes (Signed)
Procedure Name: LMA Insertion Date/Time: 10/14/2016 10:03 AM Performed by: Glory Buff Pre-anesthesia Checklist: Patient identified, Emergency Drugs available, Suction available and Patient being monitored Patient Re-evaluated:Patient Re-evaluated prior to inductionOxygen Delivery Method: Circle system utilized Preoxygenation: Pre-oxygenation with 100% oxygen Intubation Type: IV induction LMA: LMA inserted LMA Size: 4.0 Number of attempts: 1 Placement Confirmation: positive ETCO2 and breath sounds checked- equal and bilateral Tube secured with: Tape Dental Injury: Teeth and Oropharynx as per pre-operative assessment

## 2016-10-14 NOTE — Discharge Instructions (Addendum)
Circumcision-Home Care Instructions  The following instructions have been prepared to help you care for yourself upon your return home today.   Wound Care & Hygiene:   You may apply ice to the penis.  This may help to decrease swelling.  Remove the dressing tomorrow.  If the dressing falls off before then, leave it off.  You may shower or bathe in 48 hours  Gently wash the penis with soap and water.  The stitches do not need to be removed.  Activity:  Do not drive or operate any equipment today.  The effects of anesthesia are still present, drowsiness may result.  Rest today, not necessarily flat bed rest, just take it easy.  You may resume your normal activity in one to two days or as indicated by your physician.  Sexual Activity:  Erection and sexual relations should be avoided.  Diet:  Drink liquids or eat a very light diet this evening.  You may resume a regular diet tomorrow.  General Expectations of your surgery:   You may have a small amount of bleeding  The penis will be swollen and bruised for approximately one week  You may wake during the night with an erection, usually this is caused by having a full bladder so you should try to urinate (pass your water) to relieve the erection or apply ice to the penis  Unexpected Observations - Call your doctor if these occur!  Persistent or heavy bleeding  Temperature of 101 degrees or more  Severe pain not relieved by medication  You may remove the dressing in the morning.  If there is no bleeding, you may resume the lovenox tomorrow per the instructions from your medical doctor.  Additional Instructions:  Do not rub there area of the incision as that will cause problems with healing.     Patient's Signature:__________________________________________________   Nurse's Signature_______________  [facility] [phone] [address] [place], [place] [address] Tel: [phone]    Moderate Conscious Sedation, Adult,  Care After These instructions provide you with information about caring for yourself after your procedure. Your health care provider may also give you more specific instructions. Your treatment has been planned according to current medical practices, but problems sometimes occur. Call your health care provider if you have any problems or questions after your procedure. What can I expect after the procedure? After your procedure, it is common:  To feel sleepy for several hours.  To feel clumsy and have poor balance for several hours.  To have poor judgment for several hours.  To vomit if you eat too soon. Follow these instructions at home: For at least 24 hours after the procedure:    Do not:  Participate in activities where you could fall or become injured.  Drive.  Use heavy machinery.  Drink alcohol.  Take sleeping pills or medicines that cause drowsiness.  Make important decisions or sign legal documents.  Take care of children on your own.  Rest. Eating and drinking   Follow the diet recommended by your health care provider.  If you vomit:  Drink water, juice, or soup when you can drink without vomiting.  Make sure you have little or no nausea before eating solid foods. General instructions   Have a responsible adult stay with you until you are awake and alert.  Take over-the-counter and prescription medicines only as told by your health care provider.  If you smoke, do not smoke without supervision.  Keep all follow-up visits as  told by your health care provider. This is important. Contact a health care provider if:  You keep feeling nauseous or you keep vomiting.  You feel light-headed.  You develop a rash.  You have a fever. Get help right away if:  You have trouble breathing. This information is not intended to replace advice given to you by your health care provider. Make sure you discuss any questions you have with your health care  provider. Document Released: 05/23/2013 Document Revised: 01/05/2016 Document Reviewed: 11/22/2015 Elsevier Interactive Patient Education  2017 [employer].

## 2016-10-15 ENCOUNTER — Encounter (HOSPITAL_COMMUNITY): Payer: Self-pay | Admitting: Urology

## 2016-10-15 NOTE — Op Note (Signed)
NAMEELVY, [name]               ACCOUNT NO.:  [id number]  MEDICAL RECORD NO.:  [id number]  LOCATION:                                 FACILITY:  PHYSICIAN:  Kenneth Cork. Jeffie [name], M.D.    DATE OF BIRTH:  29-Aug-1950  DATE OF PROCEDURE:  10/14/2016 DATE OF DISCHARGE:                              OPERATIVE REPORT   PROCEDURE: 1. Cystoscopy. 2. Meatotomy. 3. Circumcision. 4. Debridement of glans penis.  PREOPERATIVE DIAGNOSES:  Penile necrosis with meatal stenosis.  POSTOPERATIVE DIAGNOSES:  Penile necrosis with meatal stenosis.  SURGEON:  Kenneth Cork. Jeffie [name], M.D.  ANESTHESIA:  General.  SPECIMEN:  None.  DRAINS:  None.  BLOOD LOSS:  Minimal.  COMPLICATIONS:  None.  INDICATIONS:  Mr. 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  He is uncircumcised and has had continued pain.  It was felt that circumcision, possible debridement of the glans and management of the urethral meatus were indicated.  FINDINGS OF PROCEDURE:  He was taken to the operating room and given antibiotics and general anesthetic was induced.  He was left in a supine position, he was fitted with PAS hose.  His genitalia were prepped with Betadine solution and he was draped in the usual sterile fashion.  His urethral meatus was gently dilated with a Claiborne Billings and a flexible cystoscope was passed.  This revealed a normal urethra and external sphincter.  The prostatic urethra was short with bilobar hyperplasia with mild obstruction.  Examination of the bladder revealed mild trabeculation.  No tumors, stones, or inflammation were noted.  The ureteral orifices were in the normal anatomic position effluxing clear urine.  After cystoscopy, a meatotomy was performed to extend the meatus back to healthy tissue.  A straight hemostat was used to crush the midline tissues ventrally approximately 1.5 cm back from the meatus.   The crushed tissue was then cut with a Metzenbaum scissor.  The urethral mucosa was reapproximated to the penile skin using interrupted 4-0 chromic sutures. Once the meatotomy had been completed, a circumcision was performed. However, it was tailored to avoid coming across the ventral penis where the meatotomy had been performed as to avoid any vascular compromise. So, the dorsal and lateral redundant prepuce were excised.  Once an adequate reduction in the foreskin was obtained, the hemostasis was achieved with the Bovie and the skin edges were reapproximated using interrupted 4-0 chromic sutures.  At this point, some minor debridement of the glans penis was performed, but it was felt at this point that it would be best to see how he responds to the circumcision allowing the area to dry, so hopefully the eschar will mature and resolve without further debridement as these particular conditions are known for poor wound healing.  The bladder was drained with a 16-French Foley catheter, which was then removed.  The wound was coated with Neosporin, a Xeroform gauze was wrapped around the circumcising incision followed by [name] and a dressing of Coban.  Prior to placing the dressing, a penile block had been performed with 7 mL of 0.25% Marcaine without lidocaine.  At  this point, the anesthetic was reversed.  He was moved to the recovery room in stable condition. There were no complications.     Kenneth Cork. Jeffie [name], M.D.   ______________________________ Kenneth Cork. Jeffie [name], M.D.    JJW/MEDQ  D:  10/14/2016  T:  10/15/2016  Job:  [id number]

## 2016-10-28 ENCOUNTER — Emergency Department (HOSPITAL_COMMUNITY): Payer: Medicare Other

## 2016-10-28 ENCOUNTER — Encounter (HOSPITAL_COMMUNITY): Payer: Self-pay | Admitting: Emergency Medicine

## 2016-10-28 ENCOUNTER — Emergency Department (HOSPITAL_COMMUNITY)
Admission: EM | Admit: 2016-10-28 | Discharge: 2016-10-28 | Disposition: A | Discharge status: Home / Self Care | Payer: Medicare Other | Attending: Emergency Medicine | Admitting: Emergency Medicine

## 2016-10-28 DIAGNOSIS — Z79899 Other long term (current) drug therapy: Secondary | ICD-10-CM | POA: Diagnosis not present

## 2016-10-28 DIAGNOSIS — N186 End stage renal disease: Secondary | ICD-10-CM | POA: Insufficient documentation

## 2016-10-28 DIAGNOSIS — N189 Chronic kidney disease, unspecified: Secondary | ICD-10-CM

## 2016-10-28 DIAGNOSIS — R609 Edema, unspecified: Secondary | ICD-10-CM

## 2016-10-28 DIAGNOSIS — I120 Hypertensive chronic kidney disease with stage 5 chronic kidney disease or end stage renal disease: Secondary | ICD-10-CM | POA: Diagnosis not present

## 2016-10-28 DIAGNOSIS — R410 Disorientation, unspecified: Secondary | ICD-10-CM | POA: Insufficient documentation

## 2016-10-28 DIAGNOSIS — Z794 Long term (current) use of insulin: Secondary | ICD-10-CM | POA: Diagnosis not present

## 2016-10-28 DIAGNOSIS — E1122 Type 2 diabetes mellitus with diabetic chronic kidney disease: Secondary | ICD-10-CM | POA: Diagnosis not present

## 2016-10-28 DIAGNOSIS — Z7982 Long term (current) use of aspirin: Secondary | ICD-10-CM | POA: Insufficient documentation

## 2016-10-28 DIAGNOSIS — Z7901 Long term (current) use of anticoagulants: Secondary | ICD-10-CM | POA: Diagnosis not present

## 2016-10-28 DIAGNOSIS — M7989 Other specified soft tissue disorders: Secondary | ICD-10-CM | POA: Diagnosis present

## 2016-10-28 DIAGNOSIS — Z992 Dependence on renal dialysis: Secondary | ICD-10-CM | POA: Diagnosis not present

## 2016-10-28 LAB — COMPREHENSIVE METABOLIC PANEL
ALBUMIN: 3.1 g/dL — AB (ref 3.5–5.0)
ALT: 101 U/L — ABNORMAL HIGH (ref 17–63)
AST: 235 U/L — ABNORMAL HIGH (ref 15–41)
Alkaline Phosphatase: 89 U/L (ref 38–126)
Anion gap: 21 — ABNORMAL HIGH (ref 5–15)
BILIRUBIN TOTAL: 0.9 mg/dL (ref 0.3–1.2)
BUN: 62 mg/dL — AB (ref 6–20)
CALCIUM: 8.9 mg/dL (ref 8.9–10.3)
CO2: 23 mmol/L (ref 22–32)
CREATININE: 9.2 mg/dL — AB (ref 0.61–1.24)
Chloride: 95 mmol/L — ABNORMAL LOW (ref 101–111)
GFR calc Af Amer: 6 mL/min — ABNORMAL LOW (ref >60)
GFR calc non Af Amer: 5 mL/min — ABNORMAL LOW (ref >60)
GLUCOSE: 98 mg/dL (ref 65–99)
Potassium: 5.5 mmol/L — ABNORMAL HIGH (ref 3.5–5.1)
Sodium: 139 mmol/L (ref 135–145)
TOTAL PROTEIN: 7 g/dL (ref 6.5–8.1)

## 2016-10-28 LAB — PROTIME-INR
INR: 3.01
PROTHROMBIN TIME: 31.9 s — AB (ref 11.4–15.2)

## 2016-10-28 LAB — ETHANOL: Alcohol, Ethyl (B): <5 mg/dL (ref <5)

## 2016-10-28 LAB — CBC
HCT: 32.7 % — ABNORMAL LOW (ref 39.0–52.0)
HEMOGLOBIN: 11.1 g/dL — AB (ref 13.0–17.0)
MCH: 30.9 pg (ref 26.0–34.0)
MCHC: 33.9 g/dL (ref 30.0–36.0)
MCV: 91.1 fL (ref 78.0–100.0)
PLATELETS: 139 10*3/uL — AB (ref 150–400)
RBC: 3.59 MIL/uL — ABNORMAL LOW (ref 4.22–5.81)
RDW: 16.7 % — ABNORMAL HIGH (ref 11.5–15.5)
WBC: 8.5 10*3/uL (ref 4.0–10.5)

## 2016-10-28 LAB — DIFFERENTIAL
Basophils Absolute: 0 10*3/uL (ref 0.0–0.1)
Basophils Relative: 0 %
EOS ABS: 0 10*3/uL (ref 0.0–0.7)
EOS PCT: 0 %
LYMPHS ABS: 0.6 10*3/uL — AB (ref 0.7–4.0)
Lymphocytes Relative: 7 %
MONO ABS: 0.7 10*3/uL (ref 0.1–1.0)
Monocytes Relative: 8 %
NEUTROS PCT: 85 %
Neutro Abs: 7.2 10*3/uL (ref 1.7–7.7)

## 2016-10-28 LAB — I-STAT TROPONIN, ED: Troponin i, poc: 0.04 ng/mL (ref 0.00–0.08)

## 2016-10-28 LAB — APTT: aPTT: 59 seconds — ABNORMAL HIGH (ref 24–36)

## 2016-10-28 MED ORDER — SODIUM POLYSTYRENE SULFONATE 15 GM/60ML PO SUSP
30.0000 g | Freq: Once | ORAL | Status: AC
  Administered 2016-10-28: 30 g via ORAL
  Filled 2016-10-28: qty 120

## 2016-10-28 NOTE — ED Notes (Signed)
Pt and family members stated understanding of d/c instructions and follow up care. Denies further concerns or questions. Pt wheeled out to car, no active bleeding noted.

## 2016-10-28 NOTE — Procedures (Signed)
   HEMODIALYSIS NURSING NOTE:  15g needles removed from left upper arm AVF intact/without difficulty.  Hemostasis achieved within 15 minutes.  Rockwell Alexandria, RN, CDN

## 2016-10-28 NOTE — ED Notes (Signed)
Spoke with nurse at [facility] who states pt arrived late to dialysis, which is why treatment was not completed as they were closing.  She noted pt was more drowsy than normal and assumed he had taken pain medication for his circumcision.  States pt has been noncompliant with his home medications per daughter.  MD made aware.

## 2016-10-28 NOTE — ED Notes (Signed)
Dialysis nurse de-acessing fistula at this time.

## 2016-10-28 NOTE — ED Triage Notes (Signed)
Pt had circumcision last week and now c/o penile pain. Missed last 2 dialysis tx due to the snow. Went today and had 2.8 L removed. CBG 115. Per EMS pt has some confusion.

## 2016-10-28 NOTE — Discharge Instructions (Signed)
I discussed the case with Dr. Lorrene Reid. Please make sure to call your dialysis Center tomorrow to  get worked in for an additional treatment.  Return as needed to emergency room for worsening symptoms

## 2016-10-28 NOTE — ED Provider Notes (Signed)
[name] Provider Note   CSN: [id number] Arrival date & time: 10/28/16  1603     History   Chief Complaint Chief Complaint  Patient presents with  . Penis Pain  . Leg Swelling    HPI Kenneth [name] is a 66 y.o. male.  HPI Pt  Has a history of chronic renal failure. He is supposed to go to dialysis Monday Wednesday Friday. Patient states he missed his last 2 treatments because of the snow. He went to dialysis today but he was late.  Patient was receiving dialysis, he had 2.8 L of fluid removed. Patient states this is much less than his normal amount.  The dialysis center was getting ready to close. They were concerned because the patient did not get enough of the treatment. They're also concerned about his increasing noncompliance with dialysis. They decided to send him to the emergency room for further evaluation.  Patient denies any trouble with any headache. He denies any chest pain. He denies any shortness of breath. He does have some pain in the general region associated with a recent circumcision. Past Medical History:  Diagnosis Date  . Arthritis   . Blood disease    states that he makes too many red blood cells.  . Carpal tunnel syndrome   . Claustrophobia   . Complication of anesthesia    has claustrophobia with mri  . Diabetes mellitus without complication ([place])    type 2  . ESRD (end stage renal disease) ([place])    on Dialysis M, W, F  . GERD (gastroesophageal reflux disease)    history  . Headache   . Hyperlipidemia   . Hypertension    history of, off medication now  . Renal disorder   . Renal insufficiency   . TIA (transient ischemic attack) 03/2015    Patient Active Problem List   Diagnosis Date Noted  . Atrial flutter ([place]) 11/25/2015  . Encounter for therapeutic drug monitoring 11/25/2015  . Adjustment disorder with mixed anxiety and depressed mood 03/31/2015  . Acute encephalopathy 03/30/2015  . Type 2 diabetes mellitus with renal  complications 97/67/3419  . ESRD on dialysis ([place])   . TIA (transient ischemic attack) 02/25/2015  . ESRD (end stage renal disease) ([place])   . Encounter for surgical aftercare following surgery of circulatory system 01/09/2015  . End stage renal disease ([place]) 10/16/2013  . Chronic kidney disease, stage IV (severe) ([place]) 08/28/2013  . Diabetes mellitus with kidney disease ([place]) 12/25/2007  . HYPERLIPIDEMIA 07/14/2006  . MORBID OBESITY 07/14/2006  . CARPAL TUNNEL SYNDROME 07/14/2006  . Hypertension 07/14/2006  . RENAL DISEASE, CHRONIC, STAGE III 07/14/2006  . NEPHROLITHIASIS 07/14/2006  . 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 DEGENERATION, DISC NOS 07/14/2006  . PROTEINURIA 07/14/2006    Past Surgical History:  Procedure Laterality Date  . AV FISTULA PLACEMENT Left 09/14/2013   Procedure: ARTERIOVENOUS (AV) [place];  Surgeon: Rosetta Posner, MD;  Location: [facility];  Service: Vascular;  Laterality: Left;  . AV FISTULA PLACEMENT  12/2013   done in [place]  . CIRCUMCISION N/A 10/14/2016   Procedure: CYSTOSCOPY, CIRCUMCISION ADULT AND PENILE DEBRIDEMENT,MEATONOMY;  Surgeon: Irine Seal, MD;  Location: [facility];  Service: Urology;  Laterality: N/A;  . HAND SURGERY Right    tendon and muscle repair after injury  . HERNIA REPAIR  [id number]   umbilical       Home Medications    Prior to Admission medications   Medication Sig Start Date  End Date Taking? Authorizing Provider  acetaminophen (TYLENOL) 500 MG tablet Take 1,000 mg by mouth every 6 (six) hours as needed for mild pain, moderate pain or headache.    Yes Historical Provider, MD  aspirin EC 81 MG tablet Take 81 mg by mouth daily.   Yes Historical Provider, MD  calcium acetate (PHOSLO) 667 MG capsule Take by mouth 5 (five) times daily. Pt takes four capsules three times daily with meals and one two times daily with snacks.   Yes Historical Provider, MD  enoxaparin (LOVENOX) 100 MG/ML injection Inject 1 mL (100 mg total) into the skin  daily. At 6am. 10/10/16 10/28/16 Yes Herminio Commons, MD  Insulin Detemir (LEVEMIR FLEXTOUCH) 100 UNIT/ML Pen Inject 30 Units into the skin every morning. Patient taking differently: Inject 80 Units into the skin every morning.  04/02/15  Yes Silver Huguenin Elgergawy, MD  isosorbide mononitrate (IMDUR) 30 MG 24 hr tablet TAKE ONE TABLET BY MOUTH ONCE DAILY. 06/03/16  Yes Herminio Commons, MD  metoprolol tartrate (LOPRESSOR) 25 MG tablet Take 1 tablet (25 mg total) by mouth 2 (two) times daily. 03/02/16  Yes Herminio Commons, MD  multivitamin (RENA-VIT) TABS tablet Take 1 tablet by mouth daily.   Yes Historical Provider, MD  oxyCODONE (OXY IR/ROXICODONE) 5 MG immediate release tablet Take 1 tablet (5 mg total) by mouth every 6 (six) hours as needed for severe pain. 10/14/16  Yes Irine Seal, MD  simvastatin (ZOCOR) 40 MG tablet Take 40 mg by mouth at bedtime.    Yes Historical Provider, MD  warfarin (COUMADIN) 5 MG tablet TAKE 1 TABLET BY MOUTH ONE TIME ONLY AT 6PM. 06/03/16  Yes Herminio Commons, MD  cephALEXin (KEFLEX) 500 MG capsule Take 1 capsule (500 mg total) by mouth 2 (two) times daily. Patient not taking: Reported on 10/12/2016 09/12/16   Fransico Meadow, PA-C    Family History Family History  Problem Relation Age of Onset  . Cancer Father   . Diabetes Father   . Heart disease Father     Heart Disease before age 40  . Heart attack Father   . Glaucoma Mother   . Hypertension Daughter   . Hyperlipidemia Son     Social History Social History  Substance Use Topics  . Smoking status: Never Smoker  . Smokeless tobacco: Never Used  . Alcohol use No     Comment: quit 1977     Allergies   Penicillins   Review of Systems Review of Systems  All other systems reviewed and are negative.    Physical Exam Updated Vital Signs BP 119/70   Pulse (!) 109   Temp 98.5 F (36.9 C) (Oral)   Resp (!) 36   Ht 5\' 8"  (1.727 m)   Wt 94.8 kg   SpO2 98%   BMI 31.78 kg/m   Physical  Exam  Constitutional: He appears well-developed and well-nourished. No distress.  HENT:  Head: Normocephalic and atraumatic.  Right Ear: External ear normal.  Left Ear: External ear normal.  Eyes: Conjunctivae are normal. Right eye exhibits no discharge. Left eye exhibits no discharge. No scleral icterus.  Neck: Neck supple. No tracheal deviation present.  Cardiovascular: Normal rate.   Pulmonary/Chest: Effort normal. No stridor. No respiratory distress.  Abdominal: He exhibits no distension.  Musculoskeletal: He exhibits edema.  Neurological: He is alert. Cranial nerve deficit: no gross deficits.  Skin: Skin is warm and dry. No rash noted.  Psychiatric: He has a normal  mood and affect.  Nursing note and vitals reviewed.    ED Treatments / Results  Labs (all labs ordered are listed, but only abnormal results are displayed) Labs Reviewed  PROTIME-INR - Abnormal; Notable for the following:       Result Value   Prothrombin Time 31.9 (*)    All other components within normal limits  APTT - Abnormal; Notable for the following:    aPTT 59 (*)    All other components within normal limits  CBC - Abnormal; Notable for the following:    RBC 3.59 (*)    Hemoglobin 11.1 (*)    HCT 32.7 (*)    RDW 16.7 (*)    Platelets 139 (*)    All other components within normal limits  DIFFERENTIAL - Abnormal; Notable for the following:    Lymphs Abs 0.6 (*)    All other components within normal limits  COMPREHENSIVE METABOLIC PANEL - Abnormal; Notable for the following:    Potassium 5.5 (*)    Chloride 95 (*)    BUN 62 (*)    Creatinine, Ser 9.20 (*)    Albumin 3.1 (*)    AST 235 (*)    ALT 101 (*)    GFR calc non Af Amer 5 (*)    GFR calc Af Amer 6 (*)    Anion gap 21 (*)    All other components within normal limits  ETHANOL  I-STAT TROPOININ, ED  labs reviewed.  c/w chronic renal failure.    EKG  EKG Interpretation  Date/Time:  Thursday October 28 2016 16:56:22 EDT Ventricular Rate:   109 PR Interval:    QRS Duration: 145 QT Interval:  380 QTC Calculation: 512 R Axis:   -100 Text Interpretation:  Sinus tachycardia Ventricular premature complex Nonspecific IVCD with LAD , new since last tracing Probable anteroseptal infarct, recent Since last tracing rate faster Confirmed by Trinaty Bundrick  MD-J, Nettye Flegal ([id number]) on 10/28/2016 5:01:50 PM       Radiology Ct Head Wo Contrast  Result Date: 10/28/2016 CLINICAL DATA:  Confusion and right leg weakness. EXAM: CT HEAD WITHOUT CONTRAST TECHNIQUE: Contiguous axial images were obtained from the base of the skull through the vertex without intravenous contrast. COMPARISON:  03/30/2015 brain MR and 03/21/2015 head CT FINDINGS: Brain: No evidence of acute infarction, hemorrhage, hydrocephalus, extra-axial collection or mass lesion/mass effect. Chronic small-vessel white matter ischemic changes are again noted. Vascular: Intracranial atherosclerotic vascular calcifications noted. Skull: Normal. Negative for fracture or focal lesion. Sinuses/Orbits: No acute finding. Other: None. IMPRESSION: No evidence of acute intracranial abnormality. Mild chronic small-vessel white matter ischemic changes. Electronically Signed   By: Margarette Canada M.D.   On: 10/28/2016 18:06   Dg Chest Portable 1 View  Result Date: 10/28/2016 CLINICAL DATA:  Initial valuation for acute confusion, question CHF. EXAM: PORTABLE CHEST 1 VIEW COMPARISON:  Prior radiograph from 09/04/2015. FINDINGS: Mild cardiomegaly.  Mediastinal silhouette within normal limits. Lungs hypoinflated. Mild diffuse pulmonary vascular congestion without overt pulmonary edema. No focal infiltrates. No pleural effusion. No pneumothorax. No acute osseous abnormality. IMPRESSION: 1. Mild cardiomegaly with diffuse pulmonary vascular congestion without overt pulmonary edema. 2. No other active cardiopulmonary disease. Electronically Signed   By: Jeannine Boga M.D.   On: 10/28/2016 18:20    Procedures Procedures  (including critical care time)  Medications Ordered in ED Medications  sodium polystyrene (KAYEXALATE) 15 GM/60ML suspension 30 g (not administered)     Initial Impression / Assessment and Plan / ED Course  I have reviewed the triage vital signs and the nursing notes.  Pertinent labs & imaging results that were available during my care of the patient were reviewed by me and considered in my medical decision making (see chart for details).  Clinical Course as of Oct 29 1911  Thu Oct 28, 2016  1829 No acute findings on head CT  [JK]  1830 CXR with vascular congestion but no overt pulmonary edema  [JK]  1836 Discussed findings with patient.  Pt does not want to be admitted to the hospital.  He wants to go to dialysis tomorrow.  He promises that he will go.  Staff was concerned that he has been skipping treatments.  Will consult with [facility] on call to make sure he can follow up tomorrow for treatment  [JK]  1906 Resp rate is not in the 30s.  Rate is in the 15-16 range.  [JK]  1912 Discussed with Dr Lorrene Reid.  Will give pat a dose of kayexelate orally.  Have the patient call his dialysis center tomorrow to work him in.    [JK]    Clinical Course User Index [JK] Dorie Rank, MD     Final Clinical Impressions(s) / ED Diagnoses   Final diagnoses:  Chronic renal failure, unspecified CKD stage  Peripheral edema    New Prescriptions New Prescriptions   No medications on file     Dorie Rank, MD 10/28/16 2354

## 2016-10-28 NOTE — ED Notes (Signed)
Awaiting dialysis nurse to deacess pt's fistula.

## 2016-11-02 ENCOUNTER — Emergency Department (HOSPITAL_COMMUNITY): Payer: Medicare Other

## 2016-11-02 ENCOUNTER — Inpatient Hospital Stay (HOSPITAL_COMMUNITY)
Admission: EM | Admit: 2016-11-02 | Discharge: 2016-11-06 | DRG: 871 | Disposition: A | Discharge status: Hospice | Payer: Medicare Other | Attending: Internal Medicine | Admitting: Internal Medicine

## 2016-11-02 ENCOUNTER — Ambulatory Visit: Payer: Self-pay | Admitting: Urology

## 2016-11-02 ENCOUNTER — Encounter (HOSPITAL_COMMUNITY): Payer: Self-pay

## 2016-11-02 ENCOUNTER — Inpatient Hospital Stay (HOSPITAL_COMMUNITY): Payer: Medicare Other

## 2016-11-02 DIAGNOSIS — Z8673 Personal history of transient ischemic attack (TIA), and cerebral infarction without residual deficits: Secondary | ICD-10-CM | POA: Diagnosis not present

## 2016-11-02 DIAGNOSIS — I441 Atrioventricular block, second degree: Secondary | ICD-10-CM | POA: Diagnosis present

## 2016-11-02 DIAGNOSIS — B368 Other specified superficial mycoses: Secondary | ICD-10-CM | POA: Diagnosis present

## 2016-11-02 DIAGNOSIS — K219 Gastro-esophageal reflux disease without esophagitis: Secondary | ICD-10-CM | POA: Diagnosis present

## 2016-11-02 DIAGNOSIS — I951 Orthostatic hypotension: Secondary | ICD-10-CM | POA: Diagnosis not present

## 2016-11-02 DIAGNOSIS — R002 Palpitations: Secondary | ICD-10-CM | POA: Diagnosis not present

## 2016-11-02 DIAGNOSIS — R600 Localized edema: Secondary | ICD-10-CM | POA: Diagnosis present

## 2016-11-02 DIAGNOSIS — I4892 Unspecified atrial flutter: Secondary | ICD-10-CM | POA: Diagnosis present

## 2016-11-02 DIAGNOSIS — E785 Hyperlipidemia, unspecified: Secondary | ICD-10-CM | POA: Diagnosis not present

## 2016-11-02 DIAGNOSIS — E8770 Fluid overload, unspecified: Secondary | ICD-10-CM | POA: Diagnosis present

## 2016-11-02 DIAGNOSIS — E11649 Type 2 diabetes mellitus with hypoglycemia without coma: Secondary | ICD-10-CM | POA: Diagnosis present

## 2016-11-02 DIAGNOSIS — Z880 Allergy status to penicillin: Secondary | ICD-10-CM

## 2016-11-02 DIAGNOSIS — Z515 Encounter for palliative care: Secondary | ICD-10-CM

## 2016-11-02 DIAGNOSIS — E1122 Type 2 diabetes mellitus with diabetic chronic kidney disease: Secondary | ICD-10-CM | POA: Diagnosis present

## 2016-11-02 DIAGNOSIS — Z809 Family history of malignant neoplasm, unspecified: Secondary | ICD-10-CM

## 2016-11-02 DIAGNOSIS — E1121 Type 2 diabetes mellitus with diabetic nephropathy: Secondary | ICD-10-CM | POA: Diagnosis not present

## 2016-11-02 DIAGNOSIS — Z66 Do not resuscitate: Secondary | ICD-10-CM | POA: Diagnosis present

## 2016-11-02 DIAGNOSIS — I4510 Unspecified right bundle-branch block: Secondary | ICD-10-CM | POA: Diagnosis present

## 2016-11-02 DIAGNOSIS — I9589 Other hypotension: Secondary | ICD-10-CM

## 2016-11-02 DIAGNOSIS — D649 Anemia, unspecified: Secondary | ICD-10-CM | POA: Diagnosis not present

## 2016-11-02 DIAGNOSIS — Z992 Dependence on renal dialysis: Secondary | ICD-10-CM

## 2016-11-02 DIAGNOSIS — A419 Sepsis, unspecified organism: Principal | ICD-10-CM

## 2016-11-02 DIAGNOSIS — N186 End stage renal disease: Secondary | ICD-10-CM

## 2016-11-02 DIAGNOSIS — N2581 Secondary hyperparathyroidism of renal origin: Secondary | ICD-10-CM | POA: Diagnosis present

## 2016-11-02 DIAGNOSIS — D696 Thrombocytopenia, unspecified: Secondary | ICD-10-CM | POA: Diagnosis not present

## 2016-11-02 DIAGNOSIS — N4829 Other inflammatory disorders of penis: Secondary | ICD-10-CM

## 2016-11-02 DIAGNOSIS — Z83511 Family history of glaucoma: Secondary | ICD-10-CM

## 2016-11-02 DIAGNOSIS — Z7901 Long term (current) use of anticoagulants: Secondary | ICD-10-CM | POA: Diagnosis not present

## 2016-11-02 DIAGNOSIS — M7989 Other specified soft tissue disorders: Secondary | ICD-10-CM | POA: Diagnosis present

## 2016-11-02 DIAGNOSIS — N4822 Cellulitis of corpus cavernosum and penis: Secondary | ICD-10-CM

## 2016-11-02 DIAGNOSIS — I120 Hypertensive chronic kidney disease with stage 5 chronic kidney disease or end stage renal disease: Secondary | ICD-10-CM | POA: Diagnosis present

## 2016-11-02 DIAGNOSIS — L03116 Cellulitis of left lower limb: Secondary | ICD-10-CM

## 2016-11-02 DIAGNOSIS — I1311 Hypertensive heart and chronic kidney disease without heart failure, with stage 5 chronic kidney disease, or end stage renal disease: Secondary | ICD-10-CM | POA: Diagnosis present

## 2016-11-02 DIAGNOSIS — R6521 Severe sepsis with septic shock: Secondary | ICD-10-CM

## 2016-11-02 DIAGNOSIS — I959 Hypotension, unspecified: Secondary | ICD-10-CM

## 2016-11-02 DIAGNOSIS — I482 Chronic atrial fibrillation: Secondary | ICD-10-CM | POA: Diagnosis present

## 2016-11-02 DIAGNOSIS — Z7982 Long term (current) use of aspirin: Secondary | ICD-10-CM

## 2016-11-02 DIAGNOSIS — I10 Essential (primary) hypertension: Secondary | ICD-10-CM | POA: Diagnosis not present

## 2016-11-02 DIAGNOSIS — R6520 Severe sepsis without septic shock: Secondary | ICD-10-CM | POA: Diagnosis not present

## 2016-11-02 DIAGNOSIS — Z794 Long term (current) use of insulin: Secondary | ICD-10-CM

## 2016-11-02 DIAGNOSIS — Z8249 Family history of ischemic heart disease and other diseases of the circulatory system: Secondary | ICD-10-CM

## 2016-11-02 DIAGNOSIS — Z833 Family history of diabetes mellitus: Secondary | ICD-10-CM

## 2016-11-02 LAB — CBC WITH DIFFERENTIAL/PLATELET
BASOS ABS: 0 10*3/uL (ref 0.0–0.1)
Band Neutrophils: 0 %
Basophils Absolute: 0 10*3/uL (ref 0.0–0.1)
Basophils Relative: 0 %
Basophils Relative: 0 %
Blasts: 0 %
EOS ABS: 0 10*3/uL (ref 0.0–0.7)
EOS PCT: 0 %
Eosinophils Absolute: 0 10*3/uL (ref 0.0–0.7)
Eosinophils Relative: 0 %
HCT: 35.5 % — ABNORMAL LOW (ref 39.0–52.0)
HEMATOCRIT: 37.5 % — AB (ref 39.0–52.0)
Hemoglobin: 12.4 g/dL — ABNORMAL LOW (ref 13.0–17.0)
Hemoglobin: 11.9 g/dL — ABNORMAL LOW (ref 13.0–17.0)
LYMPHS ABS: 0.4 10*3/uL — AB (ref 0.7–4.0)
Lymphocytes Relative: 2 %
Lymphocytes Relative: 3 %
Lymphs Abs: 0.5 10*3/uL — ABNORMAL LOW (ref 0.7–4.0)
MCH: 30.2 pg (ref 26.0–34.0)
MCH: 30.5 pg (ref 26.0–34.0)
MCHC: 33.1 g/dL (ref 30.0–36.0)
MCHC: 33.5 g/dL (ref 30.0–36.0)
MCV: 91.5 fL (ref 78.0–100.0)
MCV: 91 fL (ref 78.0–100.0)
MONO ABS: 0.4 10*3/uL (ref 0.1–1.0)
MYELOCYTES: 0 %
Metamyelocytes Relative: 0 %
Monocytes Absolute: 1.3 10*3/uL — ABNORMAL HIGH (ref 0.1–1.0)
Monocytes Relative: 2 %
Monocytes Relative: 7 %
NEUTROS ABS: 16.5 10*3/uL — AB (ref 1.7–7.7)
Neutro Abs: 17.3 10*3/uL — ABNORMAL HIGH (ref 1.7–7.7)
Neutrophils Relative %: 96 %
Neutrophils Relative %: 90 %
Other: 0 %
PLATELETS: 154 10*3/uL (ref 150–400)
PLATELETS: 113 10*3/uL — AB (ref 150–400)
Promyelocytes Absolute: 0 %
RBC: 4.1 MIL/uL — AB (ref 4.22–5.81)
RBC: 3.9 MIL/uL — AB (ref 4.22–5.81)
RDW: 17.2 % — AB (ref 11.5–15.5)
RDW: 16.8 % — AB (ref 11.5–15.5)
WBC MORPHOLOGY: INCREASED
WBC Morphology: INCREASED
WBC: 18.1 10*3/uL — AB (ref 4.0–10.5)
WBC: 18.3 10*3/uL — AB (ref 4.0–10.5)
nRBC: 0 /100 WBC

## 2016-11-02 LAB — APTT: aPTT: 50 seconds — ABNORMAL HIGH (ref 24–36)

## 2016-11-02 LAB — GLUCOSE, CAPILLARY
GLUCOSE-CAPILLARY: 160 mg/dL — AB (ref 65–99)
Glucose-Capillary: 156 mg/dL — ABNORMAL HIGH (ref 65–99)

## 2016-11-02 LAB — COMPREHENSIVE METABOLIC PANEL
ALK PHOS: 86 U/L (ref 38–126)
ALT: 42 U/L (ref 17–63)
AST: 43 U/L — AB (ref 15–41)
Albumin: 2.4 g/dL — ABNORMAL LOW (ref 3.5–5.0)
Anion gap: 20 — ABNORMAL HIGH (ref 5–15)
BILIRUBIN TOTAL: 1.1 mg/dL (ref 0.3–1.2)
BUN: 29 mg/dL — AB (ref 6–20)
CHLORIDE: 90 mmol/L — AB (ref 101–111)
CO2: 25 mmol/L (ref 22–32)
CREATININE: 5.35 mg/dL — AB (ref 0.61–1.24)
Calcium: 8.6 mg/dL — ABNORMAL LOW (ref 8.9–10.3)
GFR calc Af Amer: 12 mL/min — ABNORMAL LOW (ref >60)
GFR, EST NON AFRICAN AMERICAN: 10 mL/min — AB (ref >60)
Glucose, Bld: 168 mg/dL — ABNORMAL HIGH (ref 65–99)
Potassium: 3.6 mmol/L (ref 3.5–5.1)
Sodium: 135 mmol/L (ref 135–145)
Total Protein: 6.5 g/dL (ref 6.5–8.1)

## 2016-11-02 LAB — PROTIME-INR
INR: 2.8
INR: 2.84
PROTHROMBIN TIME: 30.4 s — AB (ref 11.4–15.2)
Prothrombin Time: 30.1 seconds — ABNORMAL HIGH (ref 11.4–15.2)

## 2016-11-02 LAB — I-STAT CG4 LACTIC ACID, ED: Lactic Acid, Venous: 2.99 mmol/L (ref 0.5–1.9)

## 2016-11-02 LAB — CG4 I-STAT (LACTIC ACID): Lactic Acid, Venous: 1.93 mmol/L (ref 0.5–1.9)

## 2016-11-02 MED ORDER — DEXTROSE 5 % IV SOLN: 2.0000 g | Freq: Once | INTRAVENOUS | Status: DC

## 2016-11-02 MED ORDER — WARFARIN - PHARMACIST DOSING INPATIENT: Freq: Every day | Status: DC

## 2016-11-02 MED ORDER — SIMVASTATIN 40 MG PO TABS
40.0000 mg | ORAL_TABLET | Freq: Every day | ORAL | Status: DC
  Administered 2016-11-02 – 2016-11-03 (×2): 40 mg via ORAL
  Filled 2016-11-02 (×3): qty 1

## 2016-11-02 MED ORDER — CALCIUM ACETATE (PHOS BINDER) 667 MG PO CAPS: 667.0000 mg | ORAL_CAPSULE | Freq: Three times a day (TID) | ORAL | Status: DC | PRN

## 2016-11-02 MED ORDER — SODIUM CHLORIDE 0.9 % IV BOLUS (SEPSIS)
500.0000 mL | Freq: Once | INTRAVENOUS | Status: AC
  Administered 2016-11-02: 500 mL via INTRAVENOUS

## 2016-11-02 MED ORDER — INSULIN DETEMIR 100 UNIT/ML ~~LOC~~ SOLN
80.0000 [IU] | Freq: Every morning | SUBCUTANEOUS | Status: DC
  Administered 2016-11-03: 80 [IU] via SUBCUTANEOUS
  Filled 2016-11-02 (×2): qty 0.8

## 2016-11-02 MED ORDER — ISOSORBIDE MONONITRATE ER 30 MG PO TB24: 30.0000 mg | ORAL_TABLET | Freq: Every day | ORAL | Status: DC

## 2016-11-02 MED ORDER — SODIUM CHLORIDE 0.9% FLUSH
3.0000 mL | Freq: Two times a day (BID) | INTRAVENOUS | Status: DC
  Administered 2016-11-02: 3 mL via INTRAVENOUS
  Administered 2016-11-03: 03:00:00 via INTRAVENOUS
  Administered 2016-11-03 – 2016-11-05 (×4): 3 mL via INTRAVENOUS

## 2016-11-02 MED ORDER — SODIUM CHLORIDE 0.9 % IV BOLUS (SEPSIS): 500.0000 mL | Freq: Once | INTRAVENOUS | Status: DC

## 2016-11-02 MED ORDER — IOPAMIDOL (ISOVUE-300) INJECTION 61%
INTRAVENOUS | Status: AC
  Administered 2016-11-02: 100 mL via INTRAVENOUS
  Filled 2016-11-02: qty 100

## 2016-11-02 MED ORDER — ACETAMINOPHEN 325 MG PO TABS
650.0000 mg | ORAL_TABLET | Freq: Once | ORAL | Status: AC
  Administered 2016-11-02: 650 mg via ORAL
  Filled 2016-11-02: qty 2

## 2016-11-02 MED ORDER — CALCIUM ACETATE (PHOS BINDER) 667 MG PO CAPS
2668.0000 mg | ORAL_CAPSULE | Freq: Three times a day (TID) | ORAL | Status: DC
  Administered 2016-11-02 – 2016-11-04 (×6): 2668 mg via ORAL
  Filled 2016-11-02 (×7): qty 4

## 2016-11-02 MED ORDER — CEFEPIME HCL 1 G IJ SOLR
1.0000 g | INTRAMUSCULAR | Status: DC
  Administered 2016-11-03 – 2016-11-04 (×2): 1 g via INTRAVENOUS
  Filled 2016-11-02 (×3): qty 1

## 2016-11-02 MED ORDER — VANCOMYCIN HCL 10 G IV SOLR
2000.0000 mg | Freq: Once | INTRAVENOUS | Status: AC
  Administered 2016-11-02: 2000 mg via INTRAVENOUS
  Filled 2016-11-02: qty 2000

## 2016-11-02 MED ORDER — POLYETHYLENE GLYCOL 3350 17 G PO PACK: 17.0000 g | PACK | Freq: Every day | ORAL | Status: DC | PRN

## 2016-11-02 MED ORDER — INSULIN ASPART 100 UNIT/ML ~~LOC~~ SOLN
0.0000 [IU] | Freq: Three times a day (TID) | SUBCUTANEOUS | Status: DC
  Administered 2016-11-02: 2 [IU] via SUBCUTANEOUS
  Administered 2016-11-03 – 2016-11-06 (×2): 1 [IU] via SUBCUTANEOUS
  Administered 2016-11-06: 2 [IU] via SUBCUTANEOUS

## 2016-11-02 MED ORDER — VANCOMYCIN HCL IN DEXTROSE 1-5 GM/200ML-% IV SOLN: 1000.0000 mg | Freq: Once | INTRAVENOUS | Status: DC

## 2016-11-02 MED ORDER — METOPROLOL TARTRATE 25 MG PO TABS
25.0000 mg | ORAL_TABLET | Freq: Two times a day (BID) | ORAL | Status: DC
Start: 2016-11-02 — End: 2016-11-05
  Administered 2016-11-02 – 2016-11-04 (×3): 25 mg via ORAL
  Filled 2016-11-02 (×4): qty 1

## 2016-11-02 MED ORDER — OXYCODONE HCL 5 MG PO TABS
5.0000 mg | ORAL_TABLET | Freq: Four times a day (QID) | ORAL | Status: DC | PRN
  Administered 2016-11-02 – 2016-11-03 (×3): 5 mg via ORAL
  Filled 2016-11-02 (×5): qty 1

## 2016-11-02 MED ORDER — ASPIRIN EC 81 MG PO TBEC
81.0000 mg | DELAYED_RELEASE_TABLET | Freq: Every day | ORAL | Status: DC
  Administered 2016-11-03 – 2016-11-04 (×2): 81 mg via ORAL
  Filled 2016-11-02 (×2): qty 1

## 2016-11-02 MED ORDER — NYSTATIN 100000 UNIT/GM EX CREA
TOPICAL_CREAM | Freq: Two times a day (BID) | CUTANEOUS | Status: DC
  Administered 2016-11-03 – 2016-11-06 (×8): via TOPICAL
  Filled 2016-11-02: qty 15

## 2016-11-02 MED ORDER — ONDANSETRON HCL 4 MG/2ML IJ SOLN: 4.0000 mg | Freq: Four times a day (QID) | INTRAMUSCULAR | Status: DC | PRN

## 2016-11-02 MED ORDER — ACETAMINOPHEN 500 MG PO TABS: 1000.0000 mg | ORAL_TABLET | Freq: Four times a day (QID) | ORAL | Status: DC | PRN

## 2016-11-02 MED ORDER — VANCOMYCIN HCL IN DEXTROSE 1-5 GM/200ML-% IV SOLN
1000.0000 mg | INTRAVENOUS | Status: AC
  Administered 2016-11-03: 1000 mg via INTRAVENOUS
  Filled 2016-11-02: qty 200

## 2016-11-02 MED ORDER — WARFARIN SODIUM 5 MG PO TABS
5.0000 mg | ORAL_TABLET | Freq: Once | ORAL | Status: AC
Start: 2016-11-02 — End: 2016-11-02
  Administered 2016-11-02: 5 mg via ORAL
  Filled 2016-11-02 (×2): qty 1

## 2016-11-02 MED ORDER — METOPROLOL TARTRATE 25 MG PO TABS: 25.0000 mg | ORAL_TABLET | Freq: Once | ORAL | Status: DC

## 2016-11-02 MED ORDER — ONDANSETRON HCL 4 MG PO TABS
4.0000 mg | ORAL_TABLET | Freq: Four times a day (QID) | ORAL | Status: DC | PRN
  Filled 2016-11-02 (×2): qty 1

## 2016-11-02 MED ORDER — LEVOFLOXACIN IN D5W 750 MG/150ML IV SOLN: 750.0000 mg | Freq: Once | INTRAVENOUS | Status: DC

## 2016-11-02 MED ORDER — DEXTROSE 5 % IV SOLN
2.0000 g | Freq: Once | INTRAVENOUS | Status: AC
  Administered 2016-11-02: 2 g via INTRAVENOUS
  Filled 2016-11-02: qty 2

## 2016-11-02 MED ORDER — RENA-VITE PO TABS
1.0000 | ORAL_TABLET | Freq: Every day | ORAL | Status: DC
  Administered 2016-11-02 – 2016-11-03 (×2): 1 via ORAL
  Filled 2016-11-02 (×3): qty 1

## 2016-11-02 NOTE — Consult Note (Signed)
Urology Consult   Physician requesting consult: Dorie Rank  Reason for consult: Penile infection  History of Present Illness: Kenneth [name] is a 66 y.o. male with PMH of TIA, ESRD on HD, DM II, HTN, and hyperlipidemia who presented to the ED today with c/o weakness, LE edema, and penile infection.  He had to leave dialysis early today without completing his tx due to these sx and presented for eval. In ED he was noted to be tachycardic and hypotensive.  WBC 18.  Pt unable to provide urine specimen. He was started on Cefepime and Vancomycin for probable sepsis.  He will be evaled and admitted by IM.  Pt is s/p circumcision, meatotomy, and penile debridement on 10/14/16 by Dr. 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  On f/u in the office on 10/22/16 pt was still having pain at the site of debridement but did not appear to have infection.   He is currently resting comfortably but does complain of penile pain.  He states he has been getting the urge to void while in the ED but has not been able to pass any urine.  He complains of weakness.  He denies F/C, HA, CP, SOB, N/V, and diarrhea/constipation.     Past Medical History:  Diagnosis Date  . Arthritis   . Blood disease    states that he makes too many red blood cells.  . Carpal tunnel syndrome   . Claustrophobia   . Complication of anesthesia    has claustrophobia with mri  . Diabetes mellitus without complication ([place])    type 2  . ESRD (end stage renal disease) ([place])    on Dialysis M, W, F  . GERD (gastroesophageal reflux disease)    history  . Headache   . Hyperlipidemia   . Hypertension    history of, off medication now  . Renal disorder   . Renal insufficiency   . TIA (transient ischemic attack) 03/2015    Past Surgical History:  Procedure Laterality Date  . AV FISTULA PLACEMENT Left 09/14/2013   Procedure: ARTERIOVENOUS (AV) [place];  Surgeon: Rosetta Posner, MD;  Location: [facility];  Service:  Vascular;  Laterality: Left;  . AV FISTULA PLACEMENT  12/2013   done in [place]  . CIRCUMCISION N/A 10/14/2016   Procedure: CYSTOSCOPY, CIRCUMCISION ADULT AND PENILE DEBRIDEMENT,MEATONOMY;  Surgeon: Irine Seal, MD;  Location: [facility];  Service: Urology;  Laterality: N/A;  . HAND SURGERY Right    tendon and muscle repair after injury  . HERNIA REPAIR  1017   umbilical    Current Hospital Medications:  Home Meds:  Current Meds  Medication Sig  . metoprolol tartrate (LOPRESSOR) 25 MG tablet Take 1 tablet (25 mg total) by mouth 2 (two) times daily.  Marland Kitchen oxyCODONE (OXY IR/ROXICODONE) 5 MG immediate release tablet Take 1 tablet (5 mg total) by mouth every 6 (six) hours as needed for [name] pain.    Scheduled Meds: . acetaminophen  650 mg Oral Once  . metoprolol tartrate  25 mg Oral Once   Continuous Infusions: . [START ON 11/03/2016] ceFEPime (MAXIPIME) IV     PRN Meds:.  Allergies:  Allergies  Allergen Reactions  . Penicillins Other (See Comments)    Reaction:  Unknown  Has patient had a PCN reaction causing immediate rash, facial/tongue/throat swelling, SOB or lightheadedness with hypotension: Unsure Has patient had a PCN reaction causing [name] rash involving mucus membranes or skin necrosis: Unsure Has  patient had a PCN reaction that required hospitalization Unsure  Has patient had a PCN reaction occurring within the last 10 years: No If all of the above answers are "NO", then may proceed with Cephalosporin use.    Family History  Problem Relation Age of Onset  . Cancer Father   . Diabetes Father   . Heart disease Father     Heart Disease before age 52  . Heart attack Father   . Glaucoma Mother   . Hypertension Daughter   . Hyperlipidemia Son     Social History:  reports that he has never smoked. He has never used smokeless tobacco. He reports that he does not drink alcohol or use drugs.  ROS: A complete review of systems was performed.  All systems are negative except for  pertinent findings as noted.  Physical Exam:  Vital signs in last 24 hours: Temp:  [99 F (37.2 C)-100.4 F (38 C)] 100.4 F (38 C) (03/20 1230) Pulse Rate:  [106-121] 121 (03/20 1445) Resp:  [16-29] 29 (03/20 1445) BP: (85-111)/(59-94) 96/69 (03/20 1400) SpO2:  [76 %-100 %] 98 % (03/20 1445) Weight:  [96.2 kg (212 lb)] 96.2 kg (212 lb) (03/20 1155) Constitutional:  Alert and oriented, No acute distress Cardiovascular: Regular rate and rhythm; 3+ PE bilateral LE that extends into the pelvis and lower abdomen Respiratory: Normal respiratory effort GI: Abdomen is soft, nontender, nondistended, no abdominal masses GU:  Entire genital area is tender to touch; scant amount of purulent material around glans; eschar present on glans but complete exam is not possible due to penile retraction and pain with manipulation; area of erythema over anterior scrotum but no fluctuance noted.  Lymphatic: bilateral groin lymphadenopathy L > R Neurologic: Grossly intact, no focal deficits Psychiatric: Normal mood and affect  Laboratory Data:   Recent Labs  11/02/16 1226  WBC 18.1*  HGB 12.4*  HCT 37.5*  PLT 154     Recent Labs  11/02/16 1226  NA 135  K 3.6  CL 90*  GLUCOSE 168*  BUN 29*  CALCIUM 8.6*  CREATININE 5.35*     Results for orders placed or performed during the hospital encounter of 11/02/16 (from the past 24 hour(s))  Comprehensive metabolic panel     Status: Abnormal   Collection Time: 11/02/16 12:26 PM  Result Value Ref Range   Sodium 135 135 - 145 mmol/L   Potassium 3.6 3.5 - 5.1 mmol/L   Chloride 90 (L) 101 - 111 mmol/L   CO2 25 22 - 32 mmol/L   Glucose, Bld 168 (H) 65 - 99 mg/dL   BUN 29 (H) 6 - 20 mg/dL   Creatinine, Ser 5.35 (H) 0.61 - 1.24 mg/dL   Calcium 8.6 (L) 8.9 - 10.3 mg/dL   Total Protein 6.5 6.5 - 8.1 g/dL   Albumin 2.4 (L) 3.5 - 5.0 g/dL   AST 43 (H) 15 - 41 U/L   ALT 42 17 - 63 U/L   Alkaline Phosphatase 86 38 - 126 U/L   Total Bilirubin 1.1 0.3  - 1.2 mg/dL   GFR calc non Af Amer 10 (L) >60 mL/min   GFR calc Af Amer 12 (L) >60 mL/min   Anion gap 20 (H) 5 - 15  CBC WITH DIFFERENTIAL     Status: Abnormal   Collection Time: 11/02/16 12:26 PM  Result Value Ref Range   WBC 18.1 (H) 4.0 - 10.5 K/uL   RBC 4.10 (L) 4.22 - 5.81 MIL/uL  Hemoglobin 12.4 (L) 13.0 - 17.0 g/dL   HCT 37.5 (L) 39.0 - 52.0 %   MCV 91.5 78.0 - 100.0 fL   MCH 30.2 26.0 - 34.0 pg   MCHC 33.1 30.0 - 36.0 g/dL   RDW 17.2 (H) 11.5 - 15.5 %   Platelets 154 150 - 400 K/uL   Neutrophils Relative % 96 %   Lymphocytes Relative 2 %   Monocytes Relative 2 %   Eosinophils Relative 0 %   Basophils Relative 0 %   Band Neutrophils 0 %   Metamyelocytes Relative 0 %   Myelocytes 0 %   Promyelocytes Absolute 0 %   Blasts 0 %   nRBC 0 0 /100 WBC   Other 0 %   Neutro Abs 17.3 (H) 1.7 - 7.7 K/uL   Lymphs Abs 0.4 (L) 0.7 - 4.0 K/uL   Monocytes Absolute 0.4 0.1 - 1.0 K/uL   Eosinophils Absolute 0.0 0.0 - 0.7 K/uL   Basophils Absolute 0.0 0.0 - 0.1 K/uL   WBC Morphology INCREASED BANDS (>20% BANDS)    Smear Review LARGE PLATELETS PRESENT   Protime-INR     Status: Abnormal   Collection Time: 11/02/16 12:26 PM  Result Value Ref Range   Prothrombin Time 30.1 (H) 11.4 - 15.2 seconds   INR 2.80   I-Stat CG4 Lactic Acid, ED  (not at  [facility])     Status: Abnormal   Collection Time: 11/02/16 12:37 PM  Result Value Ref Range   Lactic Acid, Venous 2.99 (HH) 0.5 - 1.9 mmol/L   Comment NOTIFIED PHYSICIAN    No results found for this or any previous visit (from the past 240 hour(s)).  Renal Function:  Recent Labs  10/28/16 1712 11/02/16 1226  CREATININE 9.20* 5.35*   Estimated Creatinine Clearance: 15.5 mL/min (A) (by C-G formula based on SCr of 5.35 mg/dL (H)).  Radiologic Imaging: No results found.   Impression/Recommendation  Admit per IM for volume overload and possible sepsis.   Penile infection --at site of meatotomy and circumcision 10/14/16.  Wound care RN  consult.  Keep area clean and dry.  Pt started on Cefepime and Vanc in ED.  CT of pelvis pending to eval for extent of infection.  Pt is going to have poor wound healing due to vascular disease, DM, and ESRD.    Check PVR due to pt feeling urge but inability to void  [facility], [place] 11/02/2016, 3:00 PM

## 2016-11-02 NOTE — ED Notes (Signed)
Spoke with pharmacy r/t infiltration in right hand, per pharmacy and policy gave pt warm compress for right hand.

## 2016-11-02 NOTE — ED Notes (Signed)
Patient transported to CT 

## 2016-11-02 NOTE — ED Notes (Signed)
Pt returned to room and placed back on monitor. Pt also bladder scanned to show 190 mL, PA made aware.

## 2016-11-02 NOTE — Progress Notes (Signed)
Pharmacy Antibiotic Note Kenneth [name] is a 66 y.o. male admitted on 11/02/2016 with sepsis.  Pharmacy has been consulted for cefepime and vancomycin dosing in setting of ESRD.  Spoke to dialysis RN who confirmed that patient will be in dialysis tomorrow, time is unknown currently.   Plan: - Vanc 1gm IV with HD tomorrow - Continue Cefepime 1gm IV Q24H - F/U HD schedule for further vanc dosing - Vanc level at [facility]   Height: 5\' 8"  (172.7 cm) Weight: 212 lb 8.4 oz (96.4 kg) IBW/kg (Calculated) : 68.4  Temp (24hrs), Avg:99.2 F (37.3 C), Min:98.4 F (36.9 C), Max:100.4 F (38 C)   Recent Labs Lab 10/28/16 1712 11/02/16 1226 11/02/16 1237 11/02/16 1634 11/02/16 1645  WBC 8.5 18.1*  --   --  18.3*  CREATININE 9.20* 5.35*  --   --   --   LATICACIDVEN  --   --  2.99* 1.93*  --     Estimated Creatinine Clearance: 15.5 mL/min (A) (by C-G formula based on SCr of 5.35 mg/dL (H)).    Allergies  Allergen Reactions  . Penicillins Other (See Comments)    Reaction:  Unknown  Has patient had a PCN reaction causing immediate rash, facial/tongue/throat swelling, SOB or lightheadedness with hypotension: Unsure Has patient had a PCN reaction causing severe rash involving mucus membranes or skin necrosis: Unsure Has patient had a PCN reaction that required hospitalization Unsure  Has patient had a PCN reaction occurring within the last 10 years: No If all of the above answers are "NO", then may proceed with Cephalosporin use.    3/20 Cefepime >>  3/20 vancomycin >>     Lydia Meng D. Mina Marble, PharmD, BCPS Pager:  [phone] 11/02/2016, 10:04 PM

## 2016-11-02 NOTE — ED Notes (Signed)
Pt has 4+ pitting edema in legs bilaterally extending into abdomen. Pt legs are weeping with broken skin on lower left leg. Pt reports having circumcision X2 weeks ago. Pt states he was not given any antibiotics for this and has been having a lot of pain in the area. Surgical incision area around the penis appears reddened and swollen with some black.

## 2016-11-02 NOTE — ED Notes (Signed)
Upon entering pt room, IV in hand appears infiltrated. Non pitting edema, pt denies pain. IV removed pt tolerated well.

## 2016-11-02 NOTE — H&P (Signed)
History and Physical    Kenneth [name] CVE:[id number] DOB: Nov 28, 1950 DOA: 11/02/2016  PCP: [facility]   Patient coming from: HD center  Chief Complaint: fever, weakness Swelling of the lower extremities  HPI: Kenneth [name] is a 66 y.o. male with medical history significant of ESRD - on HD Tue/thur/Sat. DM type II, HLD, HTN, GERD, TIA who was at the HD center today to receive his usual HD session when he was noticed to have increased swelling of the LE and weakness. His BP Was dropping and patient was referred to [facility] ED for evaluation and possible admission He has been suffering from bilateral lower extremities swelling, L>R and painful penile necrotic lesion. On 10/14/2016 patient underwent meatotomy, circumcision and debridement of  glans penis by Dr. Jeffie Pollock. However, two weeks later he was seen in the ED with c/o penile pain and LE swelling. Patient returned today with c/o persistent penile pain and bilateral LE swelling with progressive weakness  ED Course: On arrival patient was found to be hypotensive with blood pressure 85% to 5 mmHg, tachycardic with heart rate fluctuating between 160s to 121 bpm, tachypneic with respirations of 29/m and febrile with a temperature 100.73F Blood work demonstrated leukocytosis-18,100, 5.35 BUN 29 lactic acid was elevated to at 2.99 He was given 500 mL of fluid was mild improvement in blood pressure and started on intravenous broad-spectrum antibiotics.  Review of Systems: As per HPI otherwise 10 point review of systems negative.   Ambulatory Status: independent  Past Medical History:  Diagnosis Date  . Arthritis   . Blood disease    states that he makes too many red blood cells.  . Carpal tunnel syndrome   . Claustrophobia   . Complication of anesthesia    has claustrophobia with mri  . Diabetes mellitus without complication ([place])    type 2  . ESRD (end stage renal disease) ([place])    on Dialysis M, W, F  . GERD  (gastroesophageal reflux disease)    history  . Headache   . Hyperlipidemia   . Hypertension    history of, off medication now  . Renal disorder   . Renal insufficiency   . TIA (transient ischemic attack) 03/2015    Past Surgical History:  Procedure Laterality Date  . AV FISTULA PLACEMENT Left 09/14/2013   Procedure: ARTERIOVENOUS (AV) [place];  Surgeon: Rosetta Posner, MD;  Location: [address];  Service: Vascular;  Laterality: Left;  . AV FISTULA PLACEMENT  12/2013   done in [place]  . CIRCUMCISION N/A 10/14/2016   Procedure: CYSTOSCOPY, CIRCUMCISION ADULT AND PENILE DEBRIDEMENT,MEATONOMY;  Surgeon: Irine Seal, MD;  Location[facility];  Service: Urology;  Laterality: N/A;  . HAND SURGERY Right    tendon and muscle repair after injury  . HERNIA REPAIR  0258   umbilical    Social History   Social History  . Marital status: Divorced    Spouse name: N/A  . Number of children: 3  . Years of education: 32   Occupational History  .      disabled, dialysis   Social History Main Topics  . Smoking status: Never Smoker  . Smokeless tobacco: Never Used  . Alcohol use No     Comment: quit 1977  . Drug use: No  . Sexual activity: Not on file   Other Topics Concern  . Not on file   Social History Narrative   Lives in apartment with mother  Caffeine use- soft drinks 3 a day    Allergies  Allergen Reactions  . Penicillins Other (See Comments)    Reaction:  Unknown  Has patient had a PCN reaction causing immediate rash, facial/tongue/throat swelling, SOB or lightheadedness with hypotension: Unsure Has patient had a PCN reaction causing severe rash involving mucus membranes or skin necrosis: Unsure Has patient had a PCN reaction that required hospitalization Unsure  Has patient had a PCN reaction occurring within the last 10 years: No If all of the above answers are "NO", then may proceed with Cephalosporin use.    Family History  Problem Relation Age of Onset  .  Cancer Father   . Diabetes Father   . Heart disease Father     Heart Disease before age 80  . Heart attack Father   . Glaucoma Mother   . Hypertension Daughter   . Hyperlipidemia Son     Prior to Admission medications   Medication Sig Start Date End Date Taking? Authorizing Provider  metoprolol tartrate (LOPRESSOR) 25 MG tablet Take 1 tablet (25 mg total) by mouth 2 (two) times daily. 03/02/16  Yes Herminio Commons, MD  oxyCODONE (OXY IR/ROXICODONE) 5 MG immediate release tablet Take 1 tablet (5 mg total) by mouth every 6 (six) hours as needed for severe pain. 10/14/16  Yes Irine Seal, MD  acetaminophen (TYLENOL) 500 MG tablet Take 1,000 mg by mouth every 6 (six) hours as needed for mild pain, moderate pain or headache.     Historical Provider, MD  aspirin EC 81 MG tablet Take 81 mg by mouth daily.    Historical Provider, MD  calcium acetate (PHOSLO) 667 MG capsule Take by mouth 5 (five) times daily. Pt takes four capsules three times daily with meals and one two times daily with snacks.    Historical Provider, MD  cephALEXin (KEFLEX) 500 MG capsule Take 1 capsule (500 mg total) by mouth 2 (two) times daily. Patient not taking: Reported on 10/12/2016 09/12/16   Fransico Meadow, PA-C  enoxaparin (LOVENOX) 100 MG/ML injection Inject 1 mL (100 mg total) into the skin daily. At 6am. 10/10/16 10/28/16  Herminio Commons, MD  Insulin Detemir (LEVEMIR FLEXTOUCH) 100 UNIT/ML Pen Inject 30 Units into the skin every morning. Patient taking differently: Inject 80 Units into the skin every morning.  04/02/15   Silver Huguenin Elgergawy, MD  isosorbide mononitrate (IMDUR) 30 MG 24 hr tablet TAKE ONE TABLET BY MOUTH ONCE DAILY. 06/03/16   Herminio Commons, MD  multivitamin (RENA-VIT) TABS tablet Take 1 tablet by mouth daily.    Historical Provider, MD  simvastatin (ZOCOR) 40 MG tablet Take 40 mg by mouth at bedtime.     Historical Provider, MD  warfarin (COUMADIN) 5 MG tablet TAKE 1 TABLET BY MOUTH ONE TIME ONLY  AT 6PM. 06/03/16   Herminio Commons, MD    Physical Exam: Vitals:   11/02/16 1520 11/02/16 1530 11/02/16 1545 11/02/16 1600  BP: (!) 89/57 (!) 86/50 (!) 88/72 (!) 88/61  Pulse: (!) 29 (!) 121 (!) 120 (!) 128  Resp: 17 20 (!) 22 19  Temp:      TempSrc:      SpO2: 94% 97% 95% 97%  Weight:      Height:         General: Appears calm and comfortable Eyes: PERRLA, EOMI, normal lids, iris ENT:  grossly normal hearing, lips & tongue, mucous membranes moist and intact Neck: no lymphoadenopathy, masses or thyromegaly Cardiovascular: RRR,  no m/r/g. No JVD, carotid bruits. Large bilateral LE pitting (3+) edema, L>R Respiratory: bilateral no wheezes, rales, rhonchi or cracles. Normal respiratory effort. No accessory muscle use observed Abdomen: soft, large, non-tender, non-distended, no organomegaly or masses appreciated. BS present in all quadrants Skin: left LE has an area of denuded skin over the left malleolus and the dorsum of the foot surrounded by erythema, purplish reddish rash extended over the anterior shin surface , ulcers or induration seen on limited exam Musculoskeletal: grossly normal tone BUE/BLE, good ROM, no bony abnormality or joint deformities observed Psychiatric: grossly normal mood and affect, speech fluent and appropriate, alert and oriented x3 Neurologic: CN II-XII grossly intact, moves all extremities in coordinated fashion, sensation intact  Labs on Admission: I have personally reviewed following labs and imaging studies  CBC, BMP  GFR: Estimated Creatinine Clearance: 15.5 mL/min (A) (by C-G formula based on SCr of 5.35 mg/dL (H)).   Creatinine Clearance: Estimated Creatinine Clearance: 15.5 mL/min (A) (by C-G formula based on SCr of 5.35 mg/dL (H)).   Radiological Exams on Admission: Ct Pelvis W Contrast  Result Date: 11/02/2016 CLINICAL DATA:  Penile debridement 2 weeks ago, redness/swelling/itching x2 weeks EXAM: CT PELVIS WITH CONTRAST TECHNIQUE:  Multidetector CT imaging of the pelvis was performed using the standard protocol following the bolus administration of intravenous contrast. CONTRAST:  75 mL Isovue 300 IV COMPARISON:  None. FINDINGS: Urinary Tract:  Bladder is within normal limits. 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 Bowel:  Mild sigmoid diverticulosis. Visualized bowel is otherwise unremarkable. Vascular/Lymphatic: Mild atherosclerotic calcifications. 2.0 cm short axis inferior left inguinal node (series 301/image 59). Additional small bilateral inguinal nodes are favored to be reactive. Reproductive:  Prostate is grossly unremarkable. Penis is grossly unremarkable on CT, noting postsurgical changes related to recent circumcision. Other:  Trace pelvic ascites. Mild body wall edema. Musculoskeletal: Mild degenerative changes of the lower lumbar spine. IMPRESSION: Postsurgical changes related to recent penile circumcision. No evidence of fluid collection/abscess. Dominant 2 cm short axis left inferior inguinal node, presumably inflammatory/reactive. Consider clinical follow-up to ensure resolution. **An incidental finding of potential clinical significance has been found. 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 Consider follow-up CT abdomen without contrast in 3-4 weeks for confirmation. If patient is dialysis dependent and able to receive intravenous contrast, consider CT abdomen with/without contrast.** Electronically Signed   By: Julian Hy M.D.   On: 11/02/2016 15:32   Dg Chest Port 1 View  Result Date: 11/02/2016 CLINICAL DATA:  Sepsis EXAM: PORTABLE CHEST 1 VIEW COMPARISON:  10/28/2016 FINDINGS: AP portable semi-erect view chest demonstrates non inclusion of the right CP angle. There is no focal infiltrate  or effusion. There is stable cardiomegaly. Oval opacity in the left upper lobe could reflect rib end. IMPRESSION: Cardiomegaly with mild central congestion.  No acute infiltrates. Electronically Signed   By: Donavan Foil M.D.   On: 11/02/2016 15:56    EKG: not found  Assessment/Plan Principal Problem:   Severe sepsis with septic shock (CODE) (HCC) Active Problems:   Diabetes mellitus with kidney disease ([place])   Hyperlipidemia   Hypertension   ESRD (end stage renal disease) ([place])   Infection of penis   Cellulitis of left lower leg    Severe sepsis with septic shock - most likely d/t penile infection  Patient was given only 500 mL normal saline d/t risk of FVO with intermittent improvement of blood pressure, however his blood pressure remains low-88/52 , respiratory, he is tachycardic with heart rate reaching 122, which is probably associated with intravascular volume depletion Will give another bolus of IV fluid Continue IV antibiotics Continue to monitor Lactic acid trend, WBC's count and temperature  Infection of penis Urology service was contacted and will follow patient while in the hospital Urology recommended wound care consult for daily penile wound care Continue IV antibiotics - Cefepime and Vancomycin Pelvis CT is pending to evaluate the extent of infection   Left Leg cellulitis Continue IV antibiotics and ask wound care assess with dressing  ESRD - nephrology service notified by EDP  Aflutter - rate controlled, continue Coumadin per pharmacy INR today 2.80  DM type II - most recent HgbA1C is 7.1% on 10/12/2016 Continue Levemir and add sliding scale insulin Continue diabetic diet, monitor FSBS QACHS   Hypertension - currently hypotensive Will hold BP meds and restart when BP stabilized  Hyperlipidemia -last lipid profile was in 2016 Recheck fasting lipids Continue statin therapy and adjust the doses if needed  DVT prophylaxis: Coumadin Code Status:  Full Family Communication: none Disposition Plan: [place] Consults called: renal service by EDP Admission status: inpatient   York Grice, [place] Pager: [phone] [facility]  If 7PM-7AM, please contact night-coverage [email or web address] Password [email or web address]  11/02/2016, 4:04 PM

## 2016-11-02 NOTE — ED Notes (Signed)
Pt requesting to void X2, presented urinal and pt unable to void. Redness, swelling, and drainage noted around penis. PA aware.

## 2016-11-02 NOTE — ED Notes (Signed)
Attempted report 

## 2016-11-02 NOTE — ED Notes (Signed)
Hold Lopressor per PA

## 2016-11-02 NOTE — ED Triage Notes (Signed)
Pt BIB [facility] from Dialysis where pt did not receive all of treatment. Facility reports that pt had to continuously get up to use RR and they were having to take fluid off and place back on. EMS reports pt missed dialysis last week r/t snow. Pt present with 4+ pitting edema in legs bilaterally with weeping. Pt presents accessed, IV team consult placed for deaccess. EMS also reports pt had circumcision X1 week ago and thinks it is infected. Hx Afib and DM2

## 2016-11-02 NOTE — Progress Notes (Signed)
Admitted patient from E.D.Placed in telemetry bed comfortably with bed alarm set on sitting sensitive scale.Patient alert to self and place only,with poor judgement to his safety.Bilateral lower extremities 4+ edema extending upward to his lower belly.Patient has multiple bruises on his lower extremities.Left lower leg is continously weeping with clear liquid,dressed loosely with abdominal pads.Several ruptured blisters is noted on his left leg and documented.Patient has infected circumcised penile necrotic wound.Redness is noted on his entire length of penis extending upward to symphysis pubis.Non-blanchable redness is noted on sacral area and buttocks.

## 2016-11-02 NOTE — Progress Notes (Signed)
ANTICOAGULATION CONSULT NOTE - Initial Consult  Pharmacy Consult for Warfarin Indication: A flutter  Allergies  Allergen Reactions  . Penicillins Other (See Comments)    Reaction:  Unknown  Has patient had a PCN reaction causing immediate rash, facial/tongue/throat swelling, SOB or lightheadedness with hypotension: Unsure Has patient had a PCN reaction causing severe rash involving mucus membranes or skin necrosis: Unsure Has patient had a PCN reaction that required hospitalization Unsure  Has patient had a PCN reaction occurring within the last 10 years: No If all of the above answers are "NO", then may proceed with Cephalosporin use.    Patient Measurements: Height: 5\' 8"  (172.7 cm) Weight: 212 lb (96.2 kg) IBW/kg (Calculated) : 68.4  Vital Signs: Temp: 100.4 F (38 C) (03/20 1230) Temp Source: Rectal (03/20 1230) BP: 86/50 (03/20 1530) Pulse Rate: 121 (03/20 1530)  Labs:  Recent Labs  11/02/16 1226  HGB 12.4*  HCT 37.5*  PLT 154  LABPROT 30.1*  INR 2.80  CREATININE 5.35*    Estimated Creatinine Clearance: 15.5 mL/min (A) (by C-G formula based on SCr of 5.35 mg/dL (H)).   Medical History: Past Medical History:  Diagnosis Date  . Arthritis   . Blood disease    states that he makes too many red blood cells.  . Carpal tunnel syndrome   . Claustrophobia   . Complication of anesthesia    has claustrophobia with mri  . Diabetes mellitus without complication ([place])    type 2  . ESRD (end stage renal disease) ([place])    on Dialysis M, W, F  . GERD (gastroesophageal reflux disease)    history  . Headache   . Hyperlipidemia   . Hypertension    history of, off medication now  . Renal disorder   . Renal insufficiency   . TIA (transient ischemic attack) 03/2015    Medications:   (Not in a hospital admission) Scheduled:  . aspirin EC  81 mg Oral Daily  . calcium acetate  667 mg Oral 5 X Daily  . insulin aspart  0-9 Units Subcutaneous TID WC  . [START ON  11/03/2016] Insulin Detemir  80 Units Subcutaneous q morning - 10a  . isosorbide mononitrate  30 mg Oral Daily  . metoprolol tartrate  25 mg Oral Once  . multivitamin  1 tablet Oral Daily  . simvastatin  40 mg Oral QHS  . sodium chloride flush  3 mL Intravenous Q12H   Infusions:  . [START ON 11/03/2016] ceFEPime (MAXIPIME) IV    . sodium chloride     PRN: acetaminophen, ondansetron **OR** ondansetron (ZOFRAN) IV, oxyCODONE, polyethylene glycol  Assessment: Patient is a 20 yom admitted 3/20 w/ vascular access problem, penile infection, and leg swelling. He is on chronic warfarin PTA for a flutter.   Admission INR is therapeutic at 2.80. Hemoglobin is low at 12.4 and platelet count within normal limits. No bleeding noted. New antibiotics cefepime and vancomycin could potentially increase INR.  PTA dose is unclear: per pt discussion, he could not tell me how he takes this medication. He stated he does not split tablets (5mg  daily). Clinic notes state that he takes 5mg  daily except 2.5mg  Mon and Thurs.  Goal of Therapy:  INR 2-3 Monitor platelets by anticoagulation protocol: Yes   Plan:  Warfarin 5mg  PO x1 tonight Daily INRs Monitor s/sx of bleeding  Demetrius Charity, PharmD Acute Care Pharmacy Resident  Pager: [phone] 11/02/2016

## 2016-11-02 NOTE — ED Provider Notes (Signed)
[facility] DEPT Provider Note   CSN: [id number] Arrival date & time: 11/02/16  1139     History   Chief Complaint Chief Complaint  Patient presents with  . Vascular Access Problem  . Leg Swelling    HPI DEVLON DOSHER is a 66 y.o. male.  HPI MICHEIL KLAUS is a 66 y.o. male with history of end-stage renal disease on dialysis, hypertension, diabetes, TIA, atrial flutter, presents to emergency department complaining of bilateral leg swelling, penile infection, unable to complete dialysis.I spoke with dialysis center, they state the patient is unusually weak, normally able to ambulate and drive on his own, today had difficulty getting out of the car. Patient with urinary frequency while there. He appears to be significantly more fluid overloaded than his normal. Given weakness, fluid overload, patient was sent here for further evaluation. Upon arrival, patient is hypotensive and tachycardic. He did complain of pain to his penis, from recent surgical procedure. Patient had circumcision and meaotomy performed for necrotic wound on 10/14/2016. Patient is unsure how long it has been painful. Denies any cough or congestion. Denies any abdominal pain. No back pain. No headache or neck pain or stiffness. No other complaints.  Past Medical History:  Diagnosis Date  . Arthritis   . Blood disease    states that he makes too many red blood cells.  . Carpal tunnel syndrome   . Claustrophobia   . Complication of anesthesia    has claustrophobia with mri  . Diabetes mellitus without complication ([place])    type 2  . ESRD (end stage renal disease) ([place])    on Dialysis M, W, F  . GERD (gastroesophageal reflux disease)    history  . Headache   . Hyperlipidemia   . Hypertension    history of, off medication now  . Renal disorder   . Renal insufficiency   . TIA (transient ischemic attack) 03/2015    Patient Active Problem List   Diagnosis Date Noted  . Atrial flutter ([place]) 11/25/2015  .  Encounter for therapeutic drug monitoring 11/25/2015  . Adjustment disorder with mixed anxiety and depressed mood 03/31/2015  . Acute encephalopathy 03/30/2015  . Type 2 diabetes mellitus with renal complications 16/96/7893  . ESRD on dialysis ([place])   . TIA (transient ischemic attack) 02/25/2015  . ESRD (end stage renal disease) ([place])   . Encounter for surgical aftercare following surgery of circulatory system 01/09/2015  . End stage renal disease ([place]) 10/16/2013  . Chronic kidney disease, stage IV (severe) ([place]) 08/28/2013  . Diabetes mellitus with kidney disease ([place]) 12/25/2007  . HYPERLIPIDEMIA 07/14/2006  . MORBID OBESITY 07/14/2006  . CARPAL TUNNEL SYNDROME 07/14/2006  . Hypertension 07/14/2006  . RENAL DISEASE, CHRONIC, STAGE III 07/14/2006  . NEPHROLITHIASIS 07/14/2006  . 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 DEGENERATION, DISC NOS 07/14/2006  . PROTEINURIA 07/14/2006    Past Surgical History:  Procedure Laterality Date  . AV FISTULA PLACEMENT Left 09/14/2013   Procedure: ARTERIOVENOUS (AV) [place];  Surgeon: Rosetta Posner, MD;  Location: [facility];  Service: Vascular;  Laterality: Left;  . AV FISTULA PLACEMENT  12/2013   done in [place]  . CIRCUMCISION N/A 10/14/2016   Procedure: CYSTOSCOPY, CIRCUMCISION ADULT AND PENILE DEBRIDEMENT,MEATONOMY;  Surgeon: Irine Seal, MD;  Location: [facility];  Service: Urology;  Laterality: N/A;  . HAND SURGERY Right    tendon and muscle repair after injury  . HERNIA REPAIR  8101   umbilical  Home Medications    Prior to Admission medications   Medication Sig Start Date End Date Taking? Authorizing Provider  acetaminophen (TYLENOL) 500 MG tablet Take 1,000 mg by mouth every 6 (six) hours as needed for mild pain, moderate pain or headache.     Historical Provider, MD  aspirin EC 81 MG tablet Take 81 mg by mouth daily.    Historical Provider, MD  calcium acetate (PHOSLO) 667 MG capsule Take by mouth 5 (five) times daily. Pt takes  four capsules three times daily with meals and one two times daily with snacks.    Historical Provider, MD  cephALEXin (KEFLEX) 500 MG capsule Take 1 capsule (500 mg total) by mouth 2 (two) times daily. Patient not taking: Reported on 10/12/2016 09/12/16   Fransico Meadow, PA-C  enoxaparin (LOVENOX) 100 MG/ML injection Inject 1 mL (100 mg total) into the skin daily. At 6am. 10/10/16 10/28/16  Herminio Commons, MD  Insulin Detemir (LEVEMIR FLEXTOUCH) 100 UNIT/ML Pen Inject 30 Units into the skin every morning. Patient taking differently: Inject 80 Units into the skin every morning.  04/02/15   Silver Huguenin Elgergawy, MD  isosorbide mononitrate (IMDUR) 30 MG 24 hr tablet TAKE ONE TABLET BY MOUTH ONCE DAILY. 06/03/16   Herminio Commons, MD  metoprolol tartrate (LOPRESSOR) 25 MG tablet Take 1 tablet (25 mg total) by mouth 2 (two) times daily. 03/02/16   Herminio Commons, MD  multivitamin (RENA-VIT) TABS tablet Take 1 tablet by mouth daily.    Historical Provider, MD  oxyCODONE (OXY IR/ROXICODONE) 5 MG immediate release tablet Take 1 tablet (5 mg total) by mouth every 6 (six) hours as needed for severe pain. 10/14/16   Irine Seal, MD  simvastatin (ZOCOR) 40 MG tablet Take 40 mg by mouth at bedtime.     Historical Provider, MD  warfarin (COUMADIN) 5 MG tablet TAKE 1 TABLET BY MOUTH ONE TIME ONLY AT 6PM. 06/03/16   Herminio Commons, MD    Family History Family History  Problem Relation Age of Onset  . Cancer Father   . Diabetes Father   . Heart disease Father     Heart Disease before age 78  . Heart attack Father   . Glaucoma Mother   . Hypertension Daughter   . Hyperlipidemia Son     Social History Social History  Substance Use Topics  . Smoking status: Never Smoker  . Smokeless tobacco: Never Used  . Alcohol use No     Comment: quit 1977     Allergies   Penicillins   Review of Systems Review of Systems  Constitutional: Positive for fatigue. Negative for chills and fever.  HENT:  Negative for congestion.   Respiratory: Negative for cough, chest tightness and shortness of breath.   Cardiovascular: Positive for leg swelling. Negative for chest pain and palpitations.  Gastrointestinal: Negative for abdominal distention, abdominal pain, diarrhea and vomiting.  Genitourinary: Positive for penile pain and penile swelling. Negative for dysuria, frequency, hematuria and urgency.  Musculoskeletal: Negative for arthralgias, myalgias, neck pain and neck stiffness.  Skin: Positive for color change and wound. Negative for rash.  Allergic/Immunologic: Negative for immunocompromised state.  Neurological: Positive for weakness. Negative for dizziness, light-headedness, numbness and headaches.  All other systems reviewed and are negative.    Physical Exam Updated Vital Signs BP (!) 88/59 (BP Location: Right Arm)   Pulse (!) 121   Temp 99 F (37.2 C) (Oral)   Resp 20   Ht 5\' 8"  (  1.727 m)   Wt 96.2 kg   SpO2 100%   BMI 32.23 kg/m   Physical Exam  Constitutional: He appears well-developed and well-nourished. No distress.  HENT:  Head: Normocephalic and atraumatic.  Eyes: Conjunctivae are normal.  Neck: Neck supple.  Cardiovascular: Normal rate, regular rhythm and normal heart sounds.   Pulmonary/Chest: Effort normal. No respiratory distress. He has no wheezes. He has no rales.  Abdominal: Soft. Bowel sounds are normal. He exhibits no distension. There is no tenderness. There is no rebound.  Genitourinary:  Genitourinary Comments: Diffuse swelling to the foreskin of the penis, skin is retracted. There is erythema and dark discoloration to the meatus of the penis. Tender to palpation.  Musculoskeletal: He exhibits no edema.  Bilateral lower extremity edema, left worse than right. There is superficial skin peeling and blistering from the edema into the left lateral lower shin. Weeping present. Capillary refill 3 seconds to bilateral lower extremities.  Neurological: He is  alert.  Skin: Skin is warm and dry.  Nursing note and vitals reviewed.    ED Treatments / Results  Labs (all labs ordered are listed, but only abnormal results are displayed) Labs Reviewed  COMPREHENSIVE METABOLIC PANEL - Abnormal; Notable for the following:       Result Value   Chloride 90 (*)    Glucose, Bld 168 (*)    BUN 29 (*)    Creatinine, Ser 5.35 (*)    Calcium 8.6 (*)    Albumin 2.4 (*)    AST 43 (*)    GFR calc non Af Amer 10 (*)    GFR calc Af Amer 12 (*)    Anion gap 20 (*)    All other components within normal limits  CBC WITH DIFFERENTIAL/PLATELET - Abnormal; Notable for the following:    WBC 18.1 (*)    RBC 4.10 (*)    Hemoglobin 12.4 (*)    HCT 37.5 (*)    RDW 17.2 (*)    Neutro Abs 17.3 (*)    Lymphs Abs 0.4 (*)    All other components within normal limits  PROTIME-INR - Abnormal; Notable for the following:    Prothrombin Time 30.1 (*)    All other components within normal limits  I-STAT CG4 LACTIC ACID, ED - Abnormal; Notable for the following:    Lactic Acid, Venous 2.99 (*)    All other components within normal limits  CULTURE, BLOOD (ROUTINE X 2)  CULTURE, BLOOD (ROUTINE X 2)  URINE CULTURE  URINALYSIS, ROUTINE W REFLEX MICROSCOPIC  I-STAT CG4 LACTIC ACID, ED    EKG  EKG Interpretation  Date/Time:  Tuesday November 02 2016 11:50:07 EDT Ventricular Rate:  122 PR Interval:    QRS Duration: 143 QT Interval:  368 QTC Calculation: 525 R Axis:   -83 Text Interpretation:  Sinus tachycardia IVCD, consider atypical RBBB Anterior infarct, old No significant change since last tracing Confirmed by [facility]  MD, MARTHA [phone]) on 11/02/2016 1:45:29 PM       Radiology Ct Pelvis W Contrast  Result Date: 11/02/2016 CLINICAL DATA:  Penile debridement 2 weeks ago, redness/swelling/itching x2 weeks EXAM: CT PELVIS WITH CONTRAST TECHNIQUE: Multidetector CT imaging of the pelvis was performed using the standard protocol following the bolus administration of  intravenous contrast. CONTRAST:  75 mL Isovue 300 IV COMPARISON:  None. FINDINGS: Urinary Tract:  Bladder is within normal limits. 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 Bowel:  Mild sigmoid diverticulosis. Visualized bowel is otherwise unremarkable. Vascular/Lymphatic: Mild atherosclerotic calcifications. 2.0 cm short axis inferior left inguinal node (series 301/image 59). Additional small bilateral inguinal nodes are favored to be reactive. Reproductive:  Prostate is grossly unremarkable. Penis is grossly unremarkable on CT, noting postsurgical changes related to recent circumcision. Other:  Trace pelvic ascites. Mild body wall edema. Musculoskeletal: Mild degenerative changes of the lower lumbar spine. IMPRESSION: Postsurgical changes related to recent penile circumcision. No evidence of fluid collection/abscess. Dominant 2 cm short axis left inferior inguinal node, presumably inflammatory/reactive. Consider clinical follow-up to ensure resolution. **An incidental finding of potential clinical significance has been found. 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 Consider follow-up CT abdomen without contrast in 3-4 weeks for confirmation. If patient is dialysis dependent and able to receive intravenous contrast, consider CT abdomen with/without contrast.** Electronically Signed   By: Julian Hy M.D.   On: 11/02/2016 15:32   Dg Chest Port 1 View  Result Date: 11/02/2016 CLINICAL DATA:  Sepsis EXAM: PORTABLE CHEST 1 VIEW COMPARISON:  10/28/2016 FINDINGS: AP portable semi-erect view chest demonstrates non inclusion of the right CP angle. There is no focal infiltrate or effusion. There is stable cardiomegaly. Oval opacity in the left upper lobe could reflect rib end. IMPRESSION:  Cardiomegaly with mild central congestion.  No acute infiltrates. Electronically Signed   By: Donavan Foil M.D.   On: 11/02/2016 15:56    Procedures Procedures (including critical care time)  Medications Ordered in ED Medications  levofloxacin (LEVAQUIN) IVPB 750 mg (not administered)  aztreonam (AZACTAM) 2 g in dextrose 5 % 50 mL IVPB (not administered)  vancomycin (VANCOCIN) IVPB 1000 mg/200 mL premix (not administered)     Initial Impression / Assessment and Plan / ED Course  I have reviewed the triage vital signs and the nursing notes.  Pertinent labs & imaging results that were available during my care of the patient were reviewed by me and considered in my medical decision making (see chart for details).     12:22 PM Pt in ED for weakness, AMS, from dialysis, has infection to penis from recent procedure, swelling weeping to both legs. Difficulty walking. Pt is tachycardic, hypotensive, BP in 02V systolic, will activate code sepsis. Will not give bolus 30cc/kg given just having dialysis, and fluid overloaded. willl start with 552mL.   1:30 PM Sepsis - Repeat Assessment  Performed at:    1:30 PM Vitals     Blood pressure 96/69, pulse (!) 121, temperature (!) 100.4 F (38 C), temperature source Rectal, resp. rate (!) 29, height 5\' 8"  (1.727 m), weight 96.2 kg, SpO2 98 %.  Heart:     Tachycardic  Lungs:    Rales  Capillary Refill:   <2 sec  Peripheral Pulse:   Radial pulse palpable  Skin:     Normal Color and Dry   3:30 PM Lactic acid is 2.99. Patient's blood pressure came up to 111/65 after a bolus of 500 mL of saline. Will hold fluids for now. Antibiotics are running. White blood cell count is 18.1. This is concerning for infectious process. Most likely source is penile cellulitis. I discussed patient earlier with Dr. Louis Meckel with urology, and his PA has seen patient already. Recommended wound care consult, continue treatment, CT pelvis.  Discussed with [facility], will  admit. Asked to consult nephrology.   Spoke with  Dr. Moshe Cipro, will consult for dialysis.  BP back down to 80s, will give another bolus.   Final Clinical Impressions(s) / ED Diagnoses   Final diagnoses:  Sepsis, due to unspecified organism ([place])  Leg edema  Cellulitis, penis  ESRD (end stage renal disease) [facility])    New Prescriptions New Prescriptions   No medications on file     Jeannett Senior, PA-C 11/02/16 [place], MD 11/02/16 1622

## 2016-11-02 NOTE — Progress Notes (Signed)
Pharmacy Antibiotic Note NASIRE REALI is a 66 y.o. male admitted on 11/02/2016 with sepsis.  Pharmacy has been consulted for Cefepime and vancomycin dosing in setting of ESRD.  Plan: 1. Cefepime 2 grams IV x 1 now followed by Cefepime 1 gram IV every 24 hours starting on 3/21 at 18:00   2. Vancomycin 2000 mg IV x 1 now; will follow up on IHD plans and schedule further doses accordingly   Height: 5\' 8"  (172.7 cm) Weight: 212 lb (96.2 kg) IBW/kg (Calculated) : 68.4  Temp (24hrs), Avg:99 F (37.2 C), Min:99 F (37.2 C), Max:99 F (37.2 C)   Recent Labs Lab 10/28/16 1712  WBC 8.5  CREATININE 9.20*    Estimated Creatinine Clearance: 9 mL/min (A) (by C-G formula based on SCr of 9.2 mg/dL (H)).    Allergies  Allergen Reactions  . Penicillins Other (See Comments)    Reaction:  Unknown  Has patient had a PCN reaction causing immediate rash, facial/tongue/throat swelling, SOB or lightheadedness with hypotension: Unsure Has patient had a PCN reaction causing severe rash involving mucus membranes or skin necrosis: Unsure Has patient had a PCN reaction that required hospitalization Unsure  Has patient had a PCN reaction occurring within the last 10 years: No If all of the above answers are "NO", then may proceed with Cephalosporin use.    Antimicrobials this admission: 3/20 Cefepime >>  3/20 vancomycin >>   Microbiology results: px  Thank you for allowing pharmacy to be a part of this patient's care.  Vincenza Hews, PharmD, BCPS 11/02/2016, 12:26 PM

## 2016-11-03 ENCOUNTER — Other Ambulatory Visit (HOSPITAL_COMMUNITY): Payer: Self-pay

## 2016-11-03 LAB — CBC
HCT: 36.7 % — ABNORMAL LOW (ref 39.0–52.0)
HEMOGLOBIN: 12 g/dL — AB (ref 13.0–17.0)
MCH: 30.1 pg (ref 26.0–34.0)
MCHC: 32.7 g/dL (ref 30.0–36.0)
MCV: 92 fL (ref 78.0–100.0)
Platelets: 107 10*3/uL — ABNORMAL LOW (ref 150–400)
RBC: 3.99 MIL/uL — ABNORMAL LOW (ref 4.22–5.81)
RDW: 17 % — ABNORMAL HIGH (ref 11.5–15.5)
WBC: 19.4 10*3/uL — ABNORMAL HIGH (ref 4.0–10.5)

## 2016-11-03 LAB — LIPID PANEL
Cholesterol: 77 mg/dL (ref 0–200)
HDL: 25 mg/dL — ABNORMAL LOW (ref >40)
LDL Cholesterol: 35 mg/dL (ref 0–99)
Total CHOL/HDL Ratio: 3.1 RATIO
Triglycerides: 84 mg/dL (ref <150)
VLDL: 17 mg/dL (ref 0–40)

## 2016-11-03 LAB — PROTIME-INR
INR: 4
INR: 4.49
PROTHROMBIN TIME: 40 s — AB (ref 11.4–15.2)
PROTHROMBIN TIME: 43.9 s — AB (ref 11.4–15.2)

## 2016-11-03 LAB — COMPREHENSIVE METABOLIC PANEL
ALBUMIN: 2 g/dL — AB (ref 3.5–5.0)
ALT: 37 U/L (ref 17–63)
ANION GAP: 17 — AB (ref 5–15)
AST: 43 U/L — ABNORMAL HIGH (ref 15–41)
Alkaline Phosphatase: 76 U/L (ref 38–126)
BUN: 45 mg/dL — ABNORMAL HIGH (ref 6–20)
CO2: 25 mmol/L (ref 22–32)
Calcium: 8.5 mg/dL — ABNORMAL LOW (ref 8.9–10.3)
Chloride: 94 mmol/L — ABNORMAL LOW (ref 101–111)
Creatinine, Ser: 6.39 mg/dL — ABNORMAL HIGH (ref 0.61–1.24)
GFR calc Af Amer: 9 mL/min — ABNORMAL LOW (ref >60)
GFR calc non Af Amer: 8 mL/min — ABNORMAL LOW (ref >60)
Glucose, Bld: 164 mg/dL — ABNORMAL HIGH (ref 65–99)
Potassium: 3.9 mmol/L (ref 3.5–5.1)
SODIUM: 136 mmol/L (ref 135–145)
TOTAL PROTEIN: 5.7 g/dL — AB (ref 6.5–8.1)
Total Bilirubin: 1.2 mg/dL (ref 0.3–1.2)

## 2016-11-03 LAB — GLUCOSE, CAPILLARY
GLUCOSE-CAPILLARY: 122 mg/dL — AB (ref 65–99)
GLUCOSE-CAPILLARY: 57 mg/dL — AB (ref 65–99)
Glucose-Capillary: 163 mg/dL — ABNORMAL HIGH (ref 65–99)
Glucose-Capillary: 132 mg/dL — ABNORMAL HIGH (ref 65–99)
Glucose-Capillary: 62 mg/dL — ABNORMAL LOW (ref 65–99)

## 2016-11-03 MED ORDER — FLUCONAZOLE IN SODIUM CHLORIDE 200-0.9 MG/100ML-% IV SOLN
200.0000 mg | Freq: Once | INTRAVENOUS | Status: AC
  Administered 2016-11-03: 200 mg via INTRAVENOUS
  Filled 2016-11-03: qty 100

## 2016-11-03 MED ORDER — HEPARIN SODIUM (PORCINE) 1000 UNIT/ML DIALYSIS: 20.0000 [IU]/kg | INTRAMUSCULAR | Status: DC | PRN

## 2016-11-03 MED ORDER — PRO-STAT SUGAR FREE PO LIQD
30.0000 mL | Freq: Two times a day (BID) | ORAL | Status: DC
  Administered 2016-11-03 – 2016-11-04 (×3): 30 mL via ORAL
  Filled 2016-11-03 (×3): qty 30

## 2016-11-03 MED ORDER — PENTAFLUOROPROP-TETRAFLUOROETH EX AERO: 1.0000 "application " | INHALATION_SPRAY | CUTANEOUS | Status: DC | PRN

## 2016-11-03 MED ORDER — CALCITRIOL 0.5 MCG PO CAPS
ORAL_CAPSULE | ORAL | Status: AC
  Filled 2016-11-03: qty 1

## 2016-11-03 MED ORDER — HEPARIN SODIUM (PORCINE) 1000 UNIT/ML DIALYSIS
1000.0000 [IU] | INTRAMUSCULAR | Status: DC | PRN
Start: 2016-11-03 — End: 2016-11-03

## 2016-11-03 MED ORDER — MIDODRINE HCL 5 MG PO TABS
10.0000 mg | ORAL_TABLET | Freq: Three times a day (TID) | ORAL | Status: DC
  Administered 2016-11-04 (×3): 10 mg via ORAL
  Filled 2016-11-03 (×3): qty 2

## 2016-11-03 MED ORDER — FLUCONAZOLE IN SODIUM CHLORIDE 100-0.9 MG/50ML-% IV SOLN
100.0000 mg | Freq: Two times a day (BID) | INTRAVENOUS | Status: DC
  Filled 2016-11-03: qty 50

## 2016-11-03 MED ORDER — PHYTONADIONE 5 MG PO TABS
2.5000 mg | ORAL_TABLET | Freq: Once | ORAL | Status: AC
Start: 2016-11-03 — End: 2016-11-03
  Administered 2016-11-03: 2.5 mg via ORAL
  Filled 2016-11-03: qty 1

## 2016-11-03 MED ORDER — CALCITRIOL 0.5 MCG PO CAPS
0.7500 ug | ORAL_CAPSULE | ORAL | Status: DC
  Administered 2016-11-03: 0.75 ug via ORAL

## 2016-11-03 MED ORDER — CALCITRIOL 0.25 MCG PO CAPS
ORAL_CAPSULE | ORAL | Status: AC
  Filled 2016-11-03: qty 1

## 2016-11-03 MED ORDER — VANCOMYCIN HCL IN DEXTROSE 1-5 GM/200ML-% IV SOLN
INTRAVENOUS | Status: AC
  Administered 2016-11-03: 1000 mg via INTRAVENOUS
  Filled 2016-11-03: qty 200

## 2016-11-03 MED ORDER — SODIUM CHLORIDE 0.9 % IV SOLN: 100.0000 mL | INTRAVENOUS | Status: DC | PRN

## 2016-11-03 MED ORDER — LIDOCAINE HCL (PF) 1 % IJ SOLN: 5.0000 mL | INTRAMUSCULAR | Status: DC | PRN

## 2016-11-03 MED ORDER — ALTEPLASE 2 MG IJ SOLR: 2.0000 mg | Freq: Once | INTRAMUSCULAR | Status: DC | PRN

## 2016-11-03 MED ORDER — SODIUM CHLORIDE 0.9 % IV BOLUS (SEPSIS)
250.0000 mL | Freq: Once | INTRAVENOUS | Status: AC
  Administered 2016-11-03: 250 mL via INTRAVENOUS

## 2016-11-03 MED ORDER — VANCOMYCIN HCL IN DEXTROSE 1-5 GM/200ML-% IV SOLN
1000.0000 mg | INTRAVENOUS | Status: DC
  Filled 2016-11-03: qty 200

## 2016-11-03 MED ORDER — FLUCONAZOLE IN SODIUM CHLORIDE 200-0.9 MG/100ML-% IV SOLN
200.0000 mg | INTRAVENOUS | Status: DC
  Administered 2016-11-04: 200 mg via INTRAVENOUS
  Filled 2016-11-03 (×2): qty 100

## 2016-11-03 MED ORDER — LIDOCAINE-PRILOCAINE 2.5-2.5 % EX CREA: 1.0000 "application " | TOPICAL_CREAM | CUTANEOUS | Status: DC | PRN

## 2016-11-03 NOTE — Progress Notes (Addendum)
Pharmacy Antibiotic Note  Kenneth [name] is a 66 y.o. male admitted on 11/02/2016 with concern for sepsis related to penile infection.  Pharmacy has been consulted to add Fluconazole to Vancomycin + Cefepime. ESRD with plans for HD today.  The patient was also continued on warfarin from PTA for hx Aflutter. INR today is SUPRAtherapeutic (INR 4.43 << 2.84, goal of 2-3). Fluconazole is noted to start today which will increase warfarin sensitivity - will need empiric dose reduction when restarted however will hold the dose today.  Plan: 1. Fluconazole 200 mg IV every 24 hours 3. Vancomycin 1g post HD-MWF 4. Continue Cefepime 1g IV every 24 hours 5. Hold warfarin dose today 6. Will continue to follow HD schedule/duration, culture results, LOT, and antibiotic de-escalation plans 7. Will continue to monitor for any signs/symptoms of bleeding and will follow up with PT/INR in the a.m.    Height: 5\' 8"  (172.7 cm) Weight: 206 lb 2.1 oz (93.5 kg) IBW/kg (Calculated) : 68.4  Temp (24hrs), Avg:98 F (36.7 C), Min:97.8 F (36.6 C), Max:98.4 F (36.9 C)   Recent Labs Lab 10/28/16 1712 11/02/16 1226 11/02/16 1237 11/02/16 1634 11/02/16 1645 11/03/16 0824  WBC 8.5 18.1*  --   --  18.3* 19.4*  CREATININE 9.20* 5.35*  --   --   --  6.39*  LATICACIDVEN  --   --  2.99* 1.93*  --   --     Estimated Creatinine Clearance: 12.8 mL/min (A) (by C-G formula based on SCr of 6.39 mg/dL (H)).    Allergies  Allergen Reactions  . Penicillins Other (See Comments)    Reaction:  Unknown  Has patient had a PCN reaction causing immediate rash, facial/tongue/throat swelling, SOB or lightheadedness with hypotension: Unsure Has patient had a PCN reaction causing severe rash involving mucus membranes or skin necrosis: Unsure Has patient had a PCN reaction that required hospitalization Unsure  Has patient had a PCN reaction occurring within the last 10 years: No If all of the above answers are "NO", then may  proceed with Cephalosporin use.    Antimicrobials this admission: Cefepime 3/20 >> Vancomycin 3/20 >> Fluconazole 3/21 >>  Microbiology results: 3/20 BCx >>  Thank you for allowing pharmacy to be a part of this patient's care.  Alycia Rossetti, PharmD, BCPS Clinical Pharmacist Pager: [phone] Clinical phone for 11/03/2016 from 7a-3:30p: [phone] If after 3:30p, please call main pharmacy at: [phone] 11/03/2016 1:07 PM

## 2016-11-03 NOTE — Progress Notes (Signed)
[facility] PROGRESS NOTE  Kenneth [name] BJS:[id number] DOB: 02-07-1951 DOA: 11/02/2016   PCP: [place]     Assessment/Plan: Principal Problem:   Severe sepsis with septic shock (CODE) ([place]) Active Problems:   Diabetes mellitus with kidney disease ([place])   Hyperlipidemia   Hypertension   ESRD (end stage renal disease) ([place])   Infection of penis   Cellulitis of left lower leg   Cellulitis, penis   Sepsis ([place])   Arterial hypotension   66 y.o. male with ESRD on hemodialysis, DM, HTN, Afib on Coumadin. He presented to ED yesterday from dialysis with hypotension, dyspnea, weeping LE cellulitis.  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 Has had persistent pain and swelling.  On ED arrival patient found to be tachy and hypotensive and sepsis code activated. Admitted for further evaluation and treatment  Assessment and plan Severe sepsis with septic shock - most likely d/t penile infection  Status post fluid resuscitation with normal saline, continues to be tachycardic, Soft blood pressure, heart rate reaching 122, continue broad-spectrum antibiotics,prn fluid boluses Continue IV antibiotics Continue to monitor Lactic acid trend, WBC's count and temperature  Infection of penis Urology consulted 3/20  , status post penile debridement, will need aggressive wound care/Blood and urine cultures pending   Urology recommended wound care consult for daily penile wound care Continue IV antibiotics - Cefepime and Vancomycin Pelvis CT  -no evidence of fluid collection or abscess,benign renal AML. Consider follow-up CT abdomen without contrast in 3-4 weeks for confirmation  Left Leg cellulitis Continue IV antibiotics and ask wound care assess with dressing  ESRD - nephrology service notified by EDP  Aflutter - rate controlled, continue Coumadin per pharmacy INR today 4  DM type II - most  recent HgbA1C is 7.1% on 10/12/2016 Continue Levemir and add sliding scale insulin Continue diabetic diet, monitor FSBS QACHS   Hypertension - currently hypotensive Will hold BP meds and restart when BP stabilized  Hyperlipidemia -last lipid profile was in 2016 Recheck fasting lipids Continue statin therapy and adjust the doses if needed    DVT prophylaxsis Coumadin  Code Status:  Full code    Family Communication: Discussed in detail with the patient, all imaging results, lab results explained to the patient   Disposition Plan:  Continue treatment for sepsis      Consultants:  Nephrology   urology  Procedures:  Bedside debridement by urology 3/20   Antibiotics: Anti-infectives    Start     Dose/Rate Route Frequency Ordered Stop   11/04/16 2200  fluconazole (DIFLUCAN) IVPB 200 mg     200 mg 100 mL/hr over 60 Minutes Intravenous Every 24 hours 11/03/16 0846     11/03/16 1800  ceFEPIme (MAXIPIME) 1 g in dextrose 5 % 50 mL IVPB     1 g 100 mL/hr over 30 Minutes Intravenous Every 24 hours 11/02/16 1218     11/03/16 1400  fluconazole (DIFLUCAN) IVPB 200 mg     200 mg 100 mL/hr over 60 Minutes Intravenous  Once 11/03/16 0941     11/03/16 1200  vancomycin (VANCOCIN) IVPB 1000 mg/200 mL premix     1,000 mg 200 mL/hr over 60 Minutes Intravenous Every M-W-F (Hemodialysis) 11/02/16 2205 11/05/16 1159   11/03/16 1000  fluconazole (DIFLUCAN) IVPB 100 mg  Status:  Discontinued     100 mg 50 mL/hr over 60 Minutes Intravenous 2 times daily 11/03/16 0846  11/03/16 0941   11/02/16 1230  ceFEPIme (MAXIPIME) 2 g in dextrose 5 % 50 mL IVPB     2 g 100 mL/hr over 30 Minutes Intravenous  Once 11/02/16 1218 11/02/16 1316   11/02/16 1215  levofloxacin (LEVAQUIN) IVPB 750 mg  Status:  Discontinued     750 mg 100 mL/hr over 90 Minutes Intravenous  Once 11/02/16 1209 11/02/16 1218   11/02/16 1215  aztreonam (AZACTAM) 2 g in dextrose 5 % 50 mL IVPB  Status:  Discontinued     2 g 100  mL/hr over 30 Minutes Intravenous  Once 11/02/16 1209 11/02/16 1218   11/02/16 1215  vancomycin (VANCOCIN) IVPB 1000 mg/200 mL premix  Status:  Discontinued     1,000 mg 200 mL/hr over 60 Minutes Intravenous  Once 11/02/16 1209 11/02/16 1214   11/02/16 1215  vancomycin (VANCOCIN) 2,000 mg in sodium chloride 0.9 % 500 mL IVPB     2,000 mg 250 mL/hr over 120 Minutes Intravenous  Once 11/02/16 1214 11/02/16 1603         HPI/Subjective:  This morning sleepy and not very responsive to questions, confused last night  Objective: Vitals:   11/02/16 1602 11/02/16 2056 11/03/16 0420 11/03/16 0915  BP: (!) 166/97 (!) 157/92 94/68 90/72   Pulse: (!) 118 (!) 104 (!) 118 (!) 117  Resp: 18 16 18 17   Temp:  98.4 F (36.9 C) 97.8 F (36.6 C) 98.1 F (36.7 C)  TempSrc:  Oral Oral Oral  SpO2: 97% 96% 100% 100%  Weight:  96.4 kg (212 lb 8.4 oz)    Height:        Intake/Output Summary (Last 24 hours) at 11/03/16 1037 Last data filed at 11/03/16 1022  Gross per 24 hour  Intake              553 ml  Output                0 ml  Net              553 ml    Exam:  General: Ill-appearing elderly WM dozing off during questioning  Head: NCAT sclera not icteric MMM Neck: Supple. No JVD No masses Lungs:  Breathing is unlabored. Lung sounds diminished Faint crackles at bases  Heart: Tachy  with S1 S2 Abdomen: soft NT + BS GU: Necrotic penile lesion  Lower extremities: 4+ pitting edema with scattered erythematous weeping lesions  Neuro: Unable to assess  Psych:  Responds to questions but groggy  Dialysis Access: LUE AVF +bruit/thrill     Data Reviewed: I have personally reviewed following labs and imaging studies  Micro Results Recent Results (from the past 240 hour(s))  Blood Culture (routine x 2)     Status: None (Preliminary result)   Collection Time: 11/02/16 12:31 PM  Result Value Ref Range Status   Specimen Description BLOOD RIGHT ANTECUBITAL  Final   Special Requests BOTTLES DRAWN  AEROBIC ONLY 10CC  Final   Culture NO GROWTH < 24 HOURS  Final   Report Status PENDING  Incomplete  Blood Culture (routine x 2)     Status: None (Preliminary result)   Collection Time: 11/02/16 12:38 PM  Result Value Ref Range Status   Specimen Description BLOOD RIGHT FOREARM  Final   Special Requests IN PEDIATRIC BOTTLE 4CC  Final   Culture NO GROWTH < 24 HOURS  Final   Report Status PENDING  Incomplete    Radiology Reports Ct Head Wo Contrast  Result Date: 10/28/2016 CLINICAL DATA:  Confusion and right leg weakness. EXAM: CT HEAD WITHOUT CONTRAST TECHNIQUE: Contiguous axial images were obtained from the base of the skull through the vertex without intravenous contrast. COMPARISON:  03/30/2015 brain MR and 03/21/2015 head CT FINDINGS: Brain: No evidence of acute infarction, hemorrhage, hydrocephalus, extra-axial collection or mass lesion/mass effect. Chronic small-vessel white matter ischemic changes are again noted. Vascular: Intracranial atherosclerotic vascular calcifications noted. Skull: Normal. Negative for fracture or focal lesion. Sinuses/Orbits: No acute finding. Other: None. IMPRESSION: No evidence of acute intracranial abnormality. Mild chronic small-vessel white matter ischemic changes. Electronically Signed   By: Margarette Canada M.D.   On: 10/28/2016 18:06   Ct Pelvis W Contrast  Result Date: 11/02/2016 CLINICAL DATA:  Penile debridement 2 weeks ago, redness/swelling/itching x2 weeks EXAM: CT PELVIS WITH CONTRAST TECHNIQUE: Multidetector CT imaging of the pelvis was performed using the standard protocol following the bolus administration of intravenous contrast. CONTRAST:  75 mL Isovue 300 IV COMPARISON:  None. FINDINGS: Urinary Tract:  Bladder is within normal limits. 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 Bowel:  Mild sigmoid  diverticulosis. Visualized bowel is otherwise unremarkable. Vascular/Lymphatic: Mild atherosclerotic calcifications. 2.0 cm short axis inferior left inguinal node (series 301/image 59). Additional small bilateral inguinal nodes are favored to be reactive. Reproductive:  Prostate is grossly unremarkable. Penis is grossly unremarkable on CT, noting postsurgical changes related to recent circumcision. Other:  Trace pelvic ascites. Mild body wall edema. Musculoskeletal: Mild degenerative changes of the lower lumbar spine. IMPRESSION: Postsurgical changes related to recent penile circumcision. No evidence of fluid collection/abscess. Dominant 2 cm short axis left inferior inguinal node, presumably inflammatory/reactive. Consider clinical follow-up to ensure resolution. **An incidental finding of potential clinical significance has been found. 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 Consider follow-up CT abdomen without contrast in 3-4 weeks for confirmation. If patient is dialysis dependent and able to receive intravenous contrast, consider CT abdomen with/without contrast.** Electronically Signed   By: Julian Hy M.D.   On: 11/02/2016 15:32   Dg Chest Port 1 View  Result Date: 11/02/2016 CLINICAL DATA:  Sepsis EXAM: PORTABLE CHEST 1 VIEW COMPARISON:  10/28/2016 FINDINGS: AP portable semi-erect view chest demonstrates non inclusion of the right CP angle. There is no focal infiltrate or effusion. There is stable cardiomegaly. Oval opacity in the left upper lobe could reflect rib end. IMPRESSION: Cardiomegaly with mild central congestion.  No acute infiltrates. Electronically Signed   By: Donavan Foil M.D.   On: 11/02/2016 15:56   Dg Chest Portable 1 View  Result Date: 10/28/2016 CLINICAL DATA:  Initial valuation for acute confusion, question CHF. EXAM: PORTABLE CHEST 1 VIEW COMPARISON:  Prior radiograph from 09/04/2015. FINDINGS: Mild  cardiomegaly.  Mediastinal silhouette within normal limits. Lungs hypoinflated. Mild diffuse pulmonary vascular congestion without overt pulmonary edema. No focal infiltrates. No pleural effusion. No pneumothorax. No acute osseous abnormality. IMPRESSION: 1. Mild cardiomegaly with diffuse pulmonary vascular congestion without overt pulmonary edema. 2. No other active cardiopulmonary disease. Electronically Signed   By: Jeannine Boga M.D.   On: 10/28/2016 18:20     CBC  Recent Labs Lab 10/28/16 1712 11/02/16 1226 11/02/16 1645 11/03/16 0824  WBC 8.5 18.1* 18.3* 19.4*  HGB 11.1* 12.4* 11.9* 12.0*  HCT 32.7* 37.5* 35.5* 36.7*  PLT 139* 154  113* 107*  MCV 91.1 91.5 91.0 92.0  MCH 30.9 30.2 30.5 30.1  MCHC 33.9 33.1 33.5 32.7  RDW 16.7* 17.2* 16.8* 17.0*  LYMPHSABS 0.6* 0.4* 0.5*  --   MONOABS 0.7 0.4 1.3*  --   EOSABS 0.0 0.0 0.0  --   BASOSABS 0.0 0.0 0.0  --     Chemistries   Recent Labs Lab 10/28/16 1712 11/02/16 1226 11/03/16 0824  NA 139 135 136  K 5.5* 3.6 3.9  CL 95* 90* 94*  CO2 23 25 25   GLUCOSE 98 168* 164*  BUN 62* 29* 45*  CREATININE 9.20* 5.35* 6.39*  CALCIUM 8.9 8.6* 8.5*  AST 235* 43* 43*  ALT 101* 42 37  ALKPHOS 89 86 76  BILITOT 0.9 1.1 1.2   ------------------------------------------------------------------------------------------------------------------ estimated creatinine clearance is 13 mL/min (A) (by C-G formula based on SCr of 6.39 mg/dL (H)). ------------------------------------------------------------------------------------------------------------------ No results for input(s): HGBA1C in the last 72 hours. ------------------------------------------------------------------------------------------------------------------  Recent Labs  11/03/16 0824  CHOL 77  HDL 25*  LDLCALC 35  TRIG 84  CHOLHDL 3.1   ------------------------------------------------------------------------------------------------------------------ No results for  input(s): TSH, T4TOTAL, T3FREE, THYROIDAB in the last 72 hours.  Invalid input(s): FREET3 ------------------------------------------------------------------------------------------------------------------ No results for input(s): VITAMINB12, FOLATE, FERRITIN, TIBC, IRON, RETICCTPCT in the last 72 hours.  Coagulation profile  Recent Labs Lab 10/28/16 1712 11/02/16 1226 11/02/16 1645 11/03/16 0824  INR 3.01 2.80 2.84 4.00    No results for input(s): DDIMER in the last 72 hours.  Cardiac Enzymes No results for input(s): CKMB, TROPONINI, MYOGLOBIN in the last 168 hours.  Invalid input(s): CK ------------------------------------------------------------------------------------------------------------------ Invalid input(s): POCBNP   CBG:  Recent Labs Lab 11/02/16 1656 11/02/16 2055 11/03/16 0758  GLUCAP 156* 160* 163*       Studies: Ct Pelvis W Contrast  Result Date: 11/02/2016 CLINICAL DATA:  Penile debridement 2 weeks ago, redness/swelling/itching x2 weeks EXAM: CT PELVIS WITH CONTRAST TECHNIQUE: Multidetector CT imaging of the pelvis was performed using the standard protocol following the bolus administration of intravenous contrast. CONTRAST:  75 mL Isovue 300 IV COMPARISON:  None. FINDINGS: Urinary Tract:  Bladder is within normal limits. 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 Bowel:  Mild sigmoid diverticulosis. Visualized bowel is otherwise unremarkable. Vascular/Lymphatic: Mild atherosclerotic calcifications. 2.0 cm short axis inferior left inguinal node (series 301/image 59). Additional small bilateral inguinal nodes are favored to be reactive. Reproductive:  Prostate is grossly unremarkable. Penis is grossly unremarkable on CT, noting postsurgical changes related to recent circumcision. Other:  Trace pelvic ascites.  Mild body wall edema. Musculoskeletal: Mild degenerative changes of the lower lumbar spine. IMPRESSION: Postsurgical changes related to recent penile circumcision. No evidence of fluid collection/abscess. Dominant 2 cm short axis left inferior inguinal node, presumably inflammatory/reactive. Consider clinical follow-up to ensure resolution. **An incidental finding of potential clinical significance has been found. 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 Consider follow-up CT abdomen without contrast in 3-4 weeks for confirmation. If patient is dialysis dependent and able to receive intravenous contrast, consider CT abdomen with/without contrast.** Electronically Signed   By: Julian Hy M.D.   On: 11/02/2016 15:32   Dg Chest Port 1 View  Result Date: 11/02/2016 CLINICAL DATA:  Sepsis EXAM: PORTABLE CHEST 1 VIEW COMPARISON:  10/28/2016 FINDINGS: AP portable semi-erect view chest demonstrates non inclusion  of the right CP angle. There is no focal infiltrate or effusion. There is stable cardiomegaly. Oval opacity in the left upper lobe could reflect rib end. IMPRESSION: Cardiomegaly with mild central congestion.  No acute infiltrates. Electronically Signed   By: Donavan Foil M.D.   On: 11/02/2016 15:56      Lab Results  Component Value Date   HGBA1C 7.1 (H) 10/12/2016   HGBA1C 11.1 (H) 02/25/2015   HGBA1C 6.9 03/10/2009   Lab Results  Component Value Date   MICROALBUR 120.9 12/04/2008   LDLCALC 35 11/03/2016   CREATININE 6.39 (H) 11/03/2016       Scheduled Meds: . aspirin EC  81 mg Oral Daily  . calcitRIOL  0.75 mcg Oral Q M,W,F-HD  . calcium acetate  2,668 mg Oral TID WC  . ceFEPime (MAXIPIME) IV  1 g Intravenous Q24H  . feeding supplement (PRO-STAT SUGAR FREE 64)  30 mL Oral BID  . [START ON 11/04/2016] fluconazole (DIFLUCAN) IV  200 mg Intravenous Q24H  . fluconazole (DIFLUCAN) IV  200 mg Intravenous Once  .  insulin aspart  0-9 Units Subcutaneous TID WC  . insulin detemir  80 Units Subcutaneous q morning - 10a  . isosorbide mononitrate  30 mg Oral Daily  . metoprolol tartrate  25 mg Oral BID  . multivitamin  1 tablet Oral QHS  . nystatin cream   Topical BID  . simvastatin  40 mg Oral QHS  . sodium chloride  250 mL Intravenous Once  . sodium chloride  500 mL Intravenous Once  . sodium chloride flush  3 mL Intravenous Q12H  . vancomycin  1,000 mg Intravenous Q M,W,F-HD  . Warfarin - Pharmacist Dosing Inpatient   Does not apply q1800   Continuous Infusions:   LOS: 1 day    Time spent: >30 MINS    [facility] Pager [phone]. If 7PM-7AM, please contact night-coverage at [email or web address], password [facility] 11/03/2016, 10:37 AM  LOS: 1 day

## 2016-11-03 NOTE — Progress Notes (Signed)
CRITICAL VALUE ALERT  Critical value received:  INR 4.49  Date of notification:  11/03/16  Time of notification:  1:03 PM  Critical value read back:Yes.    Nurse who received alert:  Lurena Joiner  MD notified (1st page):  Dr. Allyson Sabal  Time of first page:  1304  MD notified (2nd page):  Time of second page:  Responding MD:  Dr. Allyson Sabal  Time MD responded:  [phone]

## 2016-11-03 NOTE — Consult Note (Addendum)
[name] Nurse wound consult note Reason for Consult: Consult requested for penis and left leg.  Urology is following for assessment and plan of care to penis and performed a debridement of nonviable tissue at the bedside, according to the EMR.  Requested to assess for topical treatment of the site. Wound type: Full thickness wound to inner folds of the end of the penis, near meatus. Unable to visualize inner wound.  Outer wound dark brown and dry. Measurement: 1.5X1.5X.4cm to inner penis; no odor, small amt tan drainage. Iodoform gauze packing applied to promote healing.  Pt could benefit from home health after discharge for dressing changes, and can follow-up with urology for further plan of care.  Wound bed: Left leg extending from top of foot to below knee with generalized cellulitis and edema; dark reddish purple erythremia in patchy areas, other locations have previous blisters which have ruptured and are peeling loose sheets of skin, revealing partial thickness skin loss with white moist wound beds.  Mod amt pink-yellow drainage, some odor. Dressing procedure/placement/frequency: Xeroform gauze to promote drying and healing of open areas. Discussed plan of care with patient and he verbalized understanding.  Please re-consult if further assistance is needed.  Thank-you,  Julien Girt MSN, [place], [place], [place], [place]

## 2016-11-03 NOTE — Consult Note (Addendum)
[facility] Renal Consultation Note    Indication for Consultation:  Management of ESRD/hemodialysis; anemia, hypertension/volume and secondary hyperparathyroidism  HPI: Kenneth [name] is a 66 y.o. male with ESRD on hemodialysis, DM, HTN, Afib on Coumadin. He presented to ED yesterday from dialysis with hypotension, dyspnea, weeping LE cellulitis.  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 Has had persistent pain and swelling.  On ED arrival patient found to be tachy and hypotensive and sepsis code activated. Admitted for further evaluation and treatment.  Seen at bedside yesterday evening, alert reported feeling "pretty good for everything that has happened to me".  This morning sleepy and not very responsive to questions. Hands covered as apparently was pulling lines out overnight. No complaints currently.   Dialyzes at [facility] MWF. Had extra HD session scheduled yesterday for volume overload. Was very weak and needed assistance to get from car to Center. Has not been able to reach EDW with hypotension during HD. Left 4kg over EDW yesterday. Has chronic LE edema, but HD nurse reports increased swelling yesterday with weeping lesions.   Past Medical History:  Diagnosis Date  . Arthritis   . Blood disease    states that he makes too many red blood cells.  . Carpal tunnel syndrome   . Claustrophobia   . Complication of anesthesia    has claustrophobia with mri  . Diabetes mellitus without complication ([place])    type 2  . ESRD (end stage renal disease) ([place])    on Dialysis M, W, F  . GERD (gastroesophageal reflux disease)    history  . Headache   . Hyperlipidemia   . Hypertension    history of, off medication now  . Renal disorder   . Renal insufficiency   . TIA (transient ischemic attack) 03/2015   Past Surgical History:  Procedure Laterality Date  . AV FISTULA PLACEMENT Left 09/14/2013    Procedure: ARTERIOVENOUS (AV) [place];  Surgeon: Rosetta Posner, MD;  Location: [facility];  Service: Vascular;  Laterality: Left;  . AV FISTULA PLACEMENT  12/2013   done in [place]  . CIRCUMCISION N/A 10/14/2016   Procedure: CYSTOSCOPY, CIRCUMCISION ADULT AND PENILE DEBRIDEMENT,MEATONOMY;  Surgeon: Irine Seal, MD;  Location: [facility];  Service: Urology;  Laterality: N/A;  . HAND SURGERY Right    tendon and muscle repair after injury  . HERNIA REPAIR  3329   umbilical   Family History  Problem Relation Age of Onset  . Cancer Father   . Diabetes Father   . Heart disease Father     Heart Disease before age 67  . Heart attack Father   . Glaucoma Mother   . Hypertension Daughter   . Hyperlipidemia Son    Social History:  reports that he has never smoked. He has never used smokeless tobacco. He reports that he does not drink alcohol or use drugs. Allergies  Allergen Reactions  . Penicillins Other (See Comments)    Reaction:  Unknown  Has patient had a PCN reaction causing immediate rash, facial/tongue/throat swelling, SOB or lightheadedness with hypotension: Unsure Has patient had a PCN reaction causing severe rash involving mucus membranes or skin necrosis: Unsure Has patient had a PCN reaction that required hospitalization Unsure  Has patient had a PCN reaction occurring within the last 10 years: No If all of the above answers are "NO", then may proceed with Cephalosporin use.   Prior to Admission  medications   Medication Sig Start Date End Date Taking? Authorizing Provider  metoprolol tartrate (LOPRESSOR) 25 MG tablet Take 1 tablet (25 mg total) by mouth 2 (two) times daily. 03/02/16  Yes Herminio Commons, MD  oxyCODONE (OXY IR/ROXICODONE) 5 MG immediate release tablet Take 1 tablet (5 mg total) by mouth every 6 (six) hours as needed for severe pain. 10/14/16  Yes Irine Seal, MD  warfarin (COUMADIN) 5 MG tablet TAKE 1 TABLET BY MOUTH ONE TIME ONLY AT 6PM. 06/03/16  Yes Herminio Commons, MD  acetaminophen (TYLENOL) 500 MG tablet Take 1,000 mg by mouth every 6 (six) hours as needed for mild pain, moderate pain or headache.     Historical Provider, MD  aspirin EC 81 MG tablet Take 81 mg by mouth daily.    Historical Provider, MD  calcium acetate (PHOSLO) 667 MG capsule Take by mouth 5 (five) times daily. Pt takes four capsules three times daily with meals and one two times daily with snacks.    Historical Provider, MD  cephALEXin (KEFLEX) 500 MG capsule Take 1 capsule (500 mg total) by mouth 2 (two) times daily. Patient not taking: Reported on 10/12/2016 09/12/16   Fransico Meadow, PA-C  enoxaparin (LOVENOX) 100 MG/ML injection Inject 1 mL (100 mg total) into the skin daily. At 6am. 10/10/16 10/28/16  Herminio Commons, MD  Insulin Detemir (LEVEMIR FLEXTOUCH) 100 UNIT/ML Pen Inject 30 Units into the skin every morning. Patient taking differently: Inject 80 Units into the skin every morning.  04/02/15   Silver Huguenin Elgergawy, MD  isosorbide mononitrate (IMDUR) 30 MG 24 hr tablet TAKE ONE TABLET BY MOUTH ONCE DAILY. 06/03/16   Herminio Commons, MD  multivitamin (RENA-VIT) TABS tablet Take 1 tablet by mouth daily.    Historical Provider, MD  simvastatin (ZOCOR) 40 MG tablet Take 40 mg by mouth at bedtime.     Historical Provider, MD   Current Facility-Administered Medications  Medication Dose Route Frequency Provider Last Rate Last Dose  . acetaminophen (TYLENOL) tablet 1,000 mg  1,000 mg Oral Q6H PRN Brenton Grills, PA-C      . aspirin EC tablet 81 mg  81 mg Oral Daily Brenton Grills, PA-C      . calcium acetate (PHOSLO) capsule 2,668 mg  2,668 mg Oral TID WC Brenton Grills, PA-C   2,668 mg at 11/02/16 1839  . calcium acetate (PHOSLO) capsule 935-7,017 mg  793-9,030 mg Oral TID PRN Brenton Grills, PA-C      . ceFEPIme (MAXIPIME) 1 g in dextrose 5 % 50 mL IVPB  1 g Intravenous Q24H Alfonzo Beers, MD      . insulin aspart (novoLOG) injection 0-9 Units  0-9 Units  Subcutaneous TID WC Brenton Grills, PA-C   2 Units at 11/02/16 1839  . insulin detemir (LEVEMIR) injection 80 Units  80 Units Subcutaneous q morning - 10a Brenton Grills, PA-C      . isosorbide mononitrate (IMDUR) 24 hr tablet 30 mg  30 mg Oral Daily Brenton Grills, PA-C      . metoprolol tartrate (LOPRESSOR) tablet 25 mg  25 mg Oral BID Brenton Grills, PA-C   25 mg at 11/02/16 2137  . multivitamin (RENA-VIT) tablet 1 tablet  1 tablet Oral QHS Brenton Grills, PA-C   1 tablet at 11/02/16 2136  . nystatin cream (MYCOSTATIN)   Topical BID Ardis Hughs, MD      . ondansetron [facility]) tablet  4 mg  4 mg Oral Q6H PRN Brenton Grills, PA-C       Or  . ondansetron [facility]) injection 4 mg  4 mg Intravenous Q6H PRN Brenton Grills, PA-C      . oxyCODONE (Oxy IR/ROXICODONE) immediate release tablet 5 mg  5 mg Oral Q6H PRN Brenton Grills, PA-C   5 mg at 11/03/16 0354  . polyethylene glycol (MIRALAX / GLYCOLAX) packet 17 g  17 g Oral Daily PRN Brenton Grills, PA-C      . simvastatin (ZOCOR) tablet 40 mg  40 mg Oral QHS Brenton Grills, PA-C   40 mg at 11/02/16 2137  . sodium chloride 0.9 % bolus 250 mL  250 mL Intravenous Once Reyne Dumas, MD      . sodium chloride 0.9 % bolus 500 mL  500 mL Intravenous Once Brenton Grills, PA-C   Stopped at 11/02/16 1641  . sodium chloride flush (NS) 0.9 % injection 3 mL  3 mL Intravenous Q12H Brenton Grills, PA-C   3 mL at 11/02/16 2138  . vancomycin (VANCOCIN) IVPB 1000 mg/200 mL premix  1,000 mg Intravenous Q M,W,F-HD Tyrone Apple, RPH      . Warfarin - Pharmacist Dosing Inpatient   Does not apply q1800 Delane Ginger, RPH        ROS: As per HPI otherwise negative.  Physical Exam: Vitals:   11/02/16 1600 11/02/16 1602 11/02/16 2056 11/03/16 0420  BP: (!) 88/61 (!) 166/97 (!) 157/92 94/68  Pulse: (!) 128 (!) 118 (!) 104 (!) 118  Resp: 19 18 16 18   Temp:   98.4 F (36.9 C) 97.8 F (36.6 C)  TempSrc:   Oral Oral   SpO2: 97% 97% 96% 100%  Weight:   96.4 kg (212 lb 8.4 oz)   Height:         General: Ill-appearing elderly WM dozing off during questioning  Head: NCAT sclera not icteric MMM Neck: Supple. No JVD No masses Lungs:  Breathing is unlabored. Lung sounds diminished Faint crackles at bases  Heart: Tachy  with S1 S2 Abdomen: soft NT + BS GU: Necrotic penile lesion  Lower extremities: 4+ pitting edema with scattered erythematous weeping lesions  Neuro: Unable to assess  Psych:  Responds to questions but groggy  Dialysis Access: LUE AVF +bruit/thrill   Labs: Basic Metabolic Panel:  Recent Labs Lab 10/28/16 1712 11/02/16 1226  NA 139 135  K 5.5* 3.6  CL 95* 90*  CO2 23 25  GLUCOSE 98 168*  BUN 62* 29*  CREATININE 9.20* 5.35*  CALCIUM 8.9 8.6*   Liver Function Tests:  Recent Labs Lab 10/28/16 1712 11/02/16 1226  AST 235* 43*  ALT 101* 42  ALKPHOS 89 86  BILITOT 0.9 1.1  PROT 7.0 6.5  ALBUMIN 3.1* 2.4*   No results for input(s): LIPASE, AMYLASE in the last 168 hours. No results for input(s): AMMONIA in the last 168 hours. CBC:  Recent Labs Lab 10/28/16 1712 11/02/16 1226 11/02/16 1645  WBC 8.5 18.1* 18.3*  NEUTROABS 7.2 17.3* 16.5*  HGB 11.1* 12.4* 11.9*  HCT 32.7* 37.5* 35.5*  MCV 91.1 91.5 91.0  PLT 139* 154 113*   Cardiac Enzymes: No results for input(s): CKTOTAL, CKMB, CKMBINDEX, TROPONINI in the last 168 hours. CBG:  Recent Labs Lab 11/02/16 1656 11/02/16 2055 11/03/16 0758  GLUCAP 156* 160* 163*   Iron Studies: No results for input(s): IRON, TIBC, TRANSFERRIN, FERRITIN in the last 72 hours.  Studies/Results: Ct Pelvis W Contrast  Result Date: 11/02/2016 CLINICAL DATA:  Penile debridement 2 weeks ago, redness/swelling/itching x2 weeks EXAM: CT PELVIS WITH CONTRAST TECHNIQUE: Multidetector CT imaging of the pelvis was performed using the standard protocol following the bolus administration of intravenous contrast. CONTRAST:  75 mL Isovue 300 IV  COMPARISON:  None. FINDINGS: Urinary Tract:  Bladder is within normal limits. 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 Bowel:  Mild sigmoid diverticulosis. Visualized bowel is otherwise unremarkable. Vascular/Lymphatic: Mild atherosclerotic calcifications. 2.0 cm short axis inferior left inguinal node (series 301/image 59). Additional small bilateral inguinal nodes are favored to be reactive. Reproductive:  Prostate is grossly unremarkable. Penis is grossly unremarkable on CT, noting postsurgical changes related to recent circumcision. Other:  Trace pelvic ascites. Mild body wall edema. Musculoskeletal: Mild degenerative changes of the lower lumbar spine. IMPRESSION: Postsurgical changes related to recent penile circumcision. No evidence of fluid collection/abscess. Dominant 2 cm short axis left inferior inguinal node, presumably inflammatory/reactive. Consider clinical follow-up to ensure resolution. **An incidental finding of potential clinical significance has been found. 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 Consider follow-up CT abdomen without contrast in 3-4 weeks for confirmation. If patient is dialysis dependent and able to receive intravenous contrast, consider CT abdomen with/without contrast.** Electronically Signed   By: Julian Hy M.D.   On: 11/02/2016 15:32   Dg Chest Port 1 View  Result Date: 11/02/2016 CLINICAL DATA:  Sepsis EXAM: PORTABLE CHEST 1 VIEW COMPARISON:  10/28/2016 FINDINGS: AP portable semi-erect view chest demonstrates non inclusion of the right CP angle. There is no focal infiltrate or effusion. There is stable cardiomegaly. Oval opacity in the left upper lobe could reflect rib end. IMPRESSION: Cardiomegaly with mild central congestion.  No acute  infiltrates. Electronically Signed   By: Donavan Foil M.D.   On: 11/02/2016 15:56    Dialysis Orders:  [facility] MWF 4h72mins 200F BFR450 2/2.25 Na Linear Prof 4 EDW 94.5kg  -L AVF Heparin 7000 U IV q HD -Calcitriol 0.75 mcg PO q HD -Venofer 50mg  IV q week   Assessment/Plan: 1. Sepsis/Penile infection -  Urology consulted for additional penile debridement will need aggressive wound care/Blood and urine cultures pending  IV Vanc/Cefepime  CT Pelvis pending  2.  ESRD -  MWF - For HD today  3.  Hypotension/volume  - Remains hypotensive with excess volume -  gross edema with skin breakdown - attempt UF to EDW as BP allows -left 3kg over EDW after HD yesterday  4.  Anemia  - Hgb 11.9 Not on OP esa  5.  Metabolic bone disease -  Cont VDRA/ Ca acetate binder  6.  Nutrition - Renal diet/vitamins/protein supp for low albumin  7. Aflutter - on coumadin dosing per pharmacy 8. DM - per primary    Lynnda Child PA-C [place] Pager [phone] 11/03/2016, 8:26 AM   Pt seen, examined and agree w A/P as above. ESRD pt x 3 years, here for penile infection.  Also having problems with refractory LE edema, bilat , not responding well to dialysis fluid removal attempts in OP setting because of hypotension.  Has aflutter w rapid rate also.  Also is "septic" from penile necrosis. Not sure is this is cause of hypotension or what the cause of the hypotension is.  He is clearly vol overloaded, but last ECHO showed normal LV/ RV function.  Complicated situation.  Will try serial HD with allowance of lower BP's for now , if he will tolerate.   Kelly Splinter MD [employer] pager [phone]   11/03/2016, 5:01 PM

## 2016-11-03 NOTE — Procedures (Signed)
  I was present at this dialysis session, have reviewed the session itself and made  appropriate changes Kelly Splinter MD [place] pager [phone]   11/03/2016, 5:04 PM

## 2016-11-04 ENCOUNTER — Inpatient Hospital Stay (HOSPITAL_COMMUNITY): Payer: Medicare Other

## 2016-11-04 DIAGNOSIS — I951 Orthostatic hypotension: Secondary | ICD-10-CM

## 2016-11-04 DIAGNOSIS — R002 Palpitations: Secondary | ICD-10-CM

## 2016-11-04 LAB — GLUCOSE, CAPILLARY
GLUCOSE-CAPILLARY: 32 mg/dL — AB (ref 65–99)
GLUCOSE-CAPILLARY: 82 mg/dL (ref 65–99)
GLUCOSE-CAPILLARY: 83 mg/dL (ref 65–99)
Glucose-Capillary: 92 mg/dL (ref 65–99)
Glucose-Capillary: 33 mg/dL — CL (ref 65–99)
Glucose-Capillary: 81 mg/dL (ref 65–99)
Glucose-Capillary: 61 mg/dL — ABNORMAL LOW (ref 65–99)
Glucose-Capillary: 78 mg/dL (ref 65–99)
Glucose-Capillary: 47 mg/dL — ABNORMAL LOW (ref 65–99)

## 2016-11-04 LAB — COMPREHENSIVE METABOLIC PANEL
ALBUMIN: 1.7 g/dL — AB (ref 3.5–5.0)
ALK PHOS: 147 U/L — AB (ref 38–126)
ALT: 64 U/L — ABNORMAL HIGH (ref 17–63)
AST: 116 U/L — ABNORMAL HIGH (ref 15–41)
Anion gap: 13 (ref 5–15)
BUN: 43 mg/dL — ABNORMAL HIGH (ref 6–20)
CALCIUM: 8.1 mg/dL — AB (ref 8.9–10.3)
CO2: 25 mmol/L (ref 22–32)
CREATININE: 5.08 mg/dL — AB (ref 0.61–1.24)
Chloride: 99 mmol/L — ABNORMAL LOW (ref 101–111)
GFR, EST AFRICAN AMERICAN: 13 mL/min — AB (ref >60)
GFR, EST NON AFRICAN AMERICAN: 11 mL/min — AB (ref >60)
Glucose, Bld: 65 mg/dL (ref 65–99)
Potassium: 3.9 mmol/L (ref 3.5–5.1)
Sodium: 137 mmol/L (ref 135–145)
TOTAL PROTEIN: 5.6 g/dL — AB (ref 6.5–8.1)
Total Bilirubin: 1.2 mg/dL (ref 0.3–1.2)

## 2016-11-04 LAB — CBC
HCT: 34.3 % — ABNORMAL LOW (ref 39.0–52.0)
HEMOGLOBIN: 11.2 g/dL — AB (ref 13.0–17.0)
MCH: 30.8 pg (ref 26.0–34.0)
MCHC: 32.7 g/dL (ref 30.0–36.0)
MCV: 94.2 fL (ref 78.0–100.0)
PLATELETS: 76 10*3/uL — AB (ref 150–400)
RBC: 3.64 MIL/uL — AB (ref 4.22–5.81)
RDW: 17.3 % — ABNORMAL HIGH (ref 11.5–15.5)
WBC: 16.2 10*3/uL — AB (ref 4.0–10.5)

## 2016-11-04 LAB — ECHOCARDIOGRAM COMPLETE
Height: 68 in
Weight: 2800 oz

## 2016-11-04 LAB — PROTIME-INR
INR: 1.89
Prothrombin Time: 22 seconds — ABNORMAL HIGH (ref 11.4–15.2)

## 2016-11-04 MED ORDER — INSULIN DETEMIR 100 UNIT/ML ~~LOC~~ SOLN
40.0000 [IU] | Freq: Every morning | SUBCUTANEOUS | Status: DC
  Filled 2016-11-04: qty 0.4

## 2016-11-04 MED ORDER — DEXTROSE 50 % IV SOLN
INTRAVENOUS | Status: AC
  Administered 2016-11-04: 08:00:00
  Filled 2016-11-04: qty 50

## 2016-11-04 MED ORDER — WARFARIN SODIUM 2.5 MG PO TABS
2.5000 mg | ORAL_TABLET | Freq: Once | ORAL | Status: AC
  Administered 2016-11-04: 2.5 mg via ORAL
  Filled 2016-11-04: qty 1

## 2016-11-04 MED ORDER — DEXTROSE 10 % IV SOLN
INTRAVENOUS | Status: DC
  Administered 2016-11-04 – 2016-11-05 (×2): via INTRAVENOUS

## 2016-11-04 MED ORDER — VANCOMYCIN HCL 500 MG IV SOLR
500.0000 mg | INTRAVENOUS | Status: DC
  Filled 2016-11-04: qty 500

## 2016-11-04 MED ORDER — DEXTROSE 50 % IV SOLN
INTRAVENOUS | Status: AC
  Administered 2016-11-04: 50 mL
  Filled 2016-11-04: qty 50

## 2016-11-04 MED ORDER — GLUCAGON HCL RDNA (DIAGNOSTIC) 1 MG IJ SOLR
1.0000 mg | Freq: Once | INTRAMUSCULAR | Status: AC
  Administered 2016-11-04: 1 mg via INTRAVENOUS
  Filled 2016-11-04: qty 1

## 2016-11-04 MED ORDER — SODIUM CHLORIDE 0.9 % IV BOLUS (SEPSIS)
250.0000 mL | Freq: Once | INTRAVENOUS | Status: AC
  Administered 2016-11-04: 250 mL via INTRAVENOUS

## 2016-11-04 MED ORDER — GLUCAGON HCL RDNA (DIAGNOSTIC) 1 MG IJ SOLR: 1.0000 mg | Freq: Once | INTRAVENOUS | Status: DC

## 2016-11-04 NOTE — Progress Notes (Addendum)
[facility] PROGRESS NOTE  Kenneth [name] XTK:[id number] DOB: [birth year] DOA: 11/02/2016   PCP: [place]     Assessment/Plan: Principal Problem:   Severe sepsis with septic shock (CODE) ([place]) Active Problems:   Diabetes mellitus with kidney disease ([place])   Hyperlipidemia   Hypertension   ESRD (end stage renal disease) ([place])   Infection of penis   Cellulitis of left lower leg   Cellulitis, penis   Sepsis ([place])   Arterial hypotension   66 y.o. male with ESRD on hemodialysis, DM, HTN, Afib on Coumadin. He presented to ED yesterday from dialysis with hypotension, dyspnea, weeping LE cellulitis.  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 Has had persistent pain and swelling.  On ED arrival patient found to be tachy and hypotensive and sepsis code activated. Admitted for further evaluation and treatment of sepsis, benign necrosis, urology following.  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 Consider follow-up CT abdomen without contrast in 3-4 weeks for confirmation Penile necrosis is seen primarily in individuals with DM and ESRD and is associated with a poor prognosis per urology    Left Leg cellulitis Continue IV antibiotics and  ask wound care assess with dressing  ESRD - nephrology following, on Monday Wednesday Friday,now not a good candidate for HD per Dr Jonnie Finner  Aflutter - rate controlled, continue Coumadin per pharmacy INR  , 4.49, given 2.5 mg of vitamin K, INR now 1.89  DM type II -hypoglycemic this morning, most recent HgbA1C is 7.1% on 10/12/2016 Hold Levemir today, decrease Levemir to 40 units starting tomorrow Patient started on a D10 drip at 50 due to recurrent hypoglycemia ,will give one dose of glucagon to rx hypoglycemia  Continue diabetic diet, monitor FSBS QACHS  Monitor for volume overload in the setting of D 10  Hypertension - currently hypotensive Will hold BP meds and restart when BP stabilized  Hyperlipidemia   Continue statin therapy and adjust the doses if needed    DVT prophylaxsis Coumadin  Code Status:  DNR    Family Communication: Discussed in detail with the patient's daughter by the bedside , all imaging results, lab results explained to the patient   Disposition Plan:  Poor prognosis , palliative care to see      Consultants:  Nephrology   urology  Procedures:  Bedside debridement by urology 3/20   Antibiotics: Anti-infectives    Start     Dose/Rate Route Frequency Ordered Stop   11/05/16 1200  vancomycin (VANCOCIN) IVPB 1000 mg/200 mL premix     1,000 mg 200 mL/hr over 60 Minutes Intravenous Every M-W-F (Hemodialysis) 11/03/16 1308     11/04/16 2200  fluconazole (DIFLUCAN) IVPB 200 mg     200 mg 100 mL/hr over 60 Minutes Intravenous Every 24 hours 11/03/16 0846     11/03/16 1800  ceFEPIme (MAXIPIME) 1 g in dextrose 5 % 50 mL IVPB     1 g 100 mL/hr over 30 Minutes Intravenous Every 24 hours 11/02/16 1218     11/03/16 1400  fluconazole (DIFLUCAN) IVPB 200 mg     200 mg 100 mL/hr over 60 Minutes Intravenous  Once 11/03/16 0941 11/03/16 1522   11/03/16 1200  vancomycin (VANCOCIN) IVPB 1000 mg/200 mL premix     1,000 mg 200 mL/hr over 60 Minutes  Intravenous Every M-W-F (Hemodialysis) 11/02/16 2205 11/03/16 1406   11/03/16 1000  fluconazole (DIFLUCAN) IVPB 100 mg  Status:  Discontinued     100 mg 50 mL/hr over 60 Minutes Intravenous 2 times daily 11/03/16 0846 11/03/16 0941   11/02/16 1230  ceFEPIme (MAXIPIME) 2 g in dextrose 5 % 50 mL IVPB     2 g 100 mL/hr over 30 Minutes Intravenous  Once 11/02/16 1218 11/02/16 1316   11/02/16 1215  levofloxacin (LEVAQUIN) IVPB 750 mg  Status:  Discontinued     750 mg 100 mL/hr over 90 Minutes Intravenous  Once 11/02/16 1209 11/02/16 1218   11/02/16 1215  aztreonam (AZACTAM) 2 g in dextrose 5 % 50 mL IVPB  Status:  Discontinued     2 g 100 mL/hr over 30 Minutes Intravenous  Once 11/02/16 1209 11/02/16 1218   11/02/16 1215  vancomycin (VANCOCIN) IVPB 1000 mg/200 mL premix  Status:  Discontinued     1,000 mg 200 mL/hr over 60 Minutes Intravenous  Once 11/02/16 1209 11/02/16 1214   11/02/16 1215  vancomycin (VANCOCIN) 2,000 mg in sodium chloride 0.9 % 500 mL IVPB     2,000 mg 250 mL/hr over 120 Minutes Intravenous  Once 11/02/16 1214 11/02/16 1603         HPI/Subjective:  This morning sleepy and not very responsive ,multiple hypoglycemic episodes   Objective: Vitals:   11/03/16 1719 11/03/16 2109 11/03/16 2215 11/04/16 0426  BP: 110/62 (!) 164/51  (!) 86/61  Pulse: 70 (!) 120  (!) 118  Resp: 20 18  17   Temp: 98.4 F (36.9 C) 98.4 F (36.9 C)  97.7 F (36.5 C)  TempSrc: Oral     SpO2: 96% 98%  98%  Weight:   79.4 kg (175 lb)   Height:        Intake/Output Summary (Last 24 hours) at 11/04/16 0951 Last data filed at 11/04/16 9371  Gross per 24 hour  Intake              150 ml  Output              800 ml  Net             -650 ml    Exam:  General: Ill-appearing elderly WM dozing off during questioning  Head: NCAT sclera not icteric MMM Neck: Supple. No JVD No masses Lungs:  Breathing is unlabored. Lung sounds diminished Faint crackles at bases  Heart: Tachy  with S1  S2 Abdomen: soft NT + BS GU: Necrotic penile lesion  Lower extremities: 4+ pitting edema with scattered erythematous weeping lesions  Neuro: Unable to assess  Psych:  Responds to questions but groggy  Dialysis Access: LUE AVF +bruit/thrill     Data Reviewed: I have personally reviewed following labs and imaging studies  Micro Results Recent Results (from the past 240 hour(s))  Blood Culture (routine x 2)     Status: None (Preliminary result)   Collection Time: 11/02/16 12:31 PM  Result Value  Ref Range Status   Specimen Description BLOOD RIGHT ANTECUBITAL  Final   Special Requests BOTTLES DRAWN AEROBIC ONLY 10CC  Final   Culture NO GROWTH < 24 HOURS  Final   Report Status PENDING  Incomplete  Blood Culture (routine x 2)     Status: None (Preliminary result)   Collection Time: 11/02/16 12:38 PM  Result Value Ref Range Status   Specimen Description BLOOD RIGHT FOREARM  Final   Special Requests IN PEDIATRIC BOTTLE 4CC  Final   Culture NO GROWTH < 24 HOURS  Final   Report Status PENDING  Incomplete    Radiology Reports Ct Head Wo Contrast  Result Date: 10/28/2016 CLINICAL DATA:  Confusion and right leg weakness. EXAM: CT HEAD WITHOUT CONTRAST TECHNIQUE: Contiguous axial images were obtained from the base of the skull through the vertex without intravenous contrast. COMPARISON:  03/30/2015 brain MR and 03/21/2015 head CT FINDINGS: Brain: No evidence of acute infarction, hemorrhage, hydrocephalus, extra-axial collection or mass lesion/mass effect. Chronic small-vessel white matter ischemic changes are again noted. Vascular: Intracranial atherosclerotic vascular calcifications noted. Skull: Normal. Negative for fracture or focal lesion. Sinuses/Orbits: No acute finding. Other: None. IMPRESSION: No evidence of acute intracranial abnormality. Mild chronic small-vessel white matter ischemic changes. Electronically Signed   By: Margarette Canada M.D.   On: 10/28/2016 18:06   Ct Pelvis W  Contrast  Result Date: 11/02/2016 CLINICAL DATA:  Penile debridement 2 weeks ago, redness/swelling/itching x2 weeks EXAM: CT PELVIS WITH CONTRAST TECHNIQUE: Multidetector CT imaging of the pelvis was performed using the standard protocol following the bolus administration of intravenous contrast. CONTRAST:  75 mL Isovue 300 IV COMPARISON:  None. FINDINGS: Urinary Tract:  Bladder is within normal limits. 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 Bowel:  Mild sigmoid diverticulosis. Visualized bowel is otherwise unremarkable. Vascular/Lymphatic: Mild atherosclerotic calcifications. 2.0 cm short axis inferior left inguinal node (series 301/image 59). Additional small bilateral inguinal nodes are favored to be reactive. Reproductive:  Prostate is grossly unremarkable. Penis is grossly unremarkable on CT, noting postsurgical changes related to recent circumcision. Other:  Trace pelvic ascites. Mild body wall edema. Musculoskeletal: Mild degenerative changes of the lower lumbar spine. IMPRESSION: Postsurgical changes related to recent penile circumcision. No evidence of fluid collection/abscess. Dominant 2 cm short axis left inferior inguinal node, presumably inflammatory/reactive. Consider clinical follow-up to ensure resolution. **An incidental finding of potential clinical significance has been found. 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 Consider follow-up CT abdomen without contrast in 3-4 weeks for confirmation. If patient is dialysis dependent and able to receive intravenous contrast, consider CT abdomen with/without contrast.** Electronically Signed   By: Julian Hy M.D.   On: 11/02/2016 15:32   Dg Chest Port 1 View  Result Date: 11/02/2016 CLINICAL DATA:  Sepsis EXAM: PORTABLE  CHEST 1 VIEW COMPARISON:  10/28/2016 FINDINGS: AP portable semi-erect view chest demonstrates non inclusion of the right CP angle. There is no focal infiltrate or effusion. There is stable cardiomegaly. Oval opacity in the left upper lobe could reflect rib end. IMPRESSION: Cardiomegaly with mild central congestion.  No acute infiltrates. Electronically Signed   By: Donavan Foil M.D.   On: 11/02/2016 15:56   Dg Chest Portable 1 View  Result Date: 10/28/2016 CLINICAL DATA:  Initial valuation for acute confusion, question CHF. EXAM: PORTABLE  CHEST 1 VIEW COMPARISON:  Prior radiograph from 09/04/2015. FINDINGS: Mild cardiomegaly.  Mediastinal silhouette within normal limits. Lungs hypoinflated. Mild diffuse pulmonary vascular congestion without overt pulmonary edema. No focal infiltrates. No pleural effusion. No pneumothorax. No acute osseous abnormality. IMPRESSION: 1. Mild cardiomegaly with diffuse pulmonary vascular congestion without overt pulmonary edema. 2. No other active cardiopulmonary disease. Electronically Signed   By: Jeannine Boga M.D.   On: 10/28/2016 18:20     CBC  Recent Labs Lab 10/28/16 1712 11/02/16 1226 11/02/16 1645 11/03/16 0824  WBC 8.5 18.1* 18.3* 19.4*  HGB 11.1* 12.4* 11.9* 12.0*  HCT 32.7* 37.5* 35.5* 36.7*  PLT 139* 154 113* 107*  MCV 91.1 91.5 91.0 92.0  MCH 30.9 30.2 30.5 30.1  MCHC 33.9 33.1 33.5 32.7  RDW 16.7* 17.2* 16.8* 17.0*  LYMPHSABS 0.6* 0.4* 0.5*  --   MONOABS 0.7 0.4 1.3*  --   EOSABS 0.0 0.0 0.0  --   BASOSABS 0.0 0.0 0.0  --     Chemistries   Recent Labs Lab 10/28/16 1712 11/02/16 1226 11/03/16 0824  NA 139 135 136  K 5.5* 3.6 3.9  CL 95* 90* 94*  CO2 23 25 25   GLUCOSE 98 168* 164*  BUN 62* 29* 45*  CREATININE 9.20* 5.35* 6.39*  CALCIUM 8.9 8.6* 8.5*  AST 235* 43* 43*  ALT 101* 42 37  ALKPHOS 89 86 76  BILITOT 0.9 1.1 1.2    ------------------------------------------------------------------------------------------------------------------ estimated creatinine clearance is 11.2 mL/min (A) (by C-G formula based on SCr of 6.39 mg/dL (H)). ------------------------------------------------------------------------------------------------------------------ No results for input(s): HGBA1C in the last 72 hours. ------------------------------------------------------------------------------------------------------------------  Recent Labs  11/03/16 0824  CHOL 77  HDL 25*  LDLCALC 35  TRIG 84  CHOLHDL 3.1   ------------------------------------------------------------------------------------------------------------------ No results for input(s): TSH, T4TOTAL, T3FREE, THYROIDAB in the last 72 hours.  Invalid input(s): FREET3 ------------------------------------------------------------------------------------------------------------------ No results for input(s): VITAMINB12, FOLATE, FERRITIN, TIBC, IRON, RETICCTPCT in the last 72 hours.  Coagulation profile  Recent Labs Lab 11/02/16 1226 11/02/16 1645 11/03/16 0824 11/03/16 1200 11/04/16 0555  INR 2.80 2.84 4.00 4.49* 1.89    No results for input(s): DDIMER in the last 72 hours.  Cardiac Enzymes No results for input(s): CKMB, TROPONINI, MYOGLOBIN in the last 168 hours.  Invalid input(s): CK ------------------------------------------------------------------------------------------------------------------ Invalid input(s): POCBNP   CBG:  Recent Labs Lab 11/03/16 2104 11/03/16 2218 11/04/16 0057 11/04/16 0752 11/04/16 0818  GLUCAP 57* 62* 83 32* 92       Studies: Ct Pelvis W Contrast  Result Date: 11/02/2016 CLINICAL DATA:  Penile debridement 2 weeks ago, redness/swelling/itching x2 weeks EXAM: CT PELVIS WITH CONTRAST TECHNIQUE: Multidetector CT imaging of the pelvis was performed using the standard protocol following the bolus  administration of intravenous contrast. CONTRAST:  75 mL Isovue 300 IV COMPARISON:  None. FINDINGS: Urinary Tract:  Bladder is within normal limits. 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 Bowel:  Mild sigmoid diverticulosis. Visualized bowel is otherwise unremarkable. Vascular/Lymphatic: Mild atherosclerotic calcifications. 2.0 cm short axis inferior left inguinal node (series 301/image 59). Additional small bilateral inguinal nodes are favored to be reactive. Reproductive:  Prostate is grossly unremarkable. Penis is grossly unremarkable on CT, noting postsurgical changes related to recent circumcision. Other:  Trace pelvic ascites. Mild body wall edema. Musculoskeletal: Mild degenerative changes of the lower lumbar spine. IMPRESSION: Postsurgical changes related to recent penile  circumcision. No evidence of fluid collection/abscess. Dominant 2 cm short axis left inferior inguinal node, presumably inflammatory/reactive. Consider clinical follow-up to ensure resolution. **An incidental finding of potential clinical significance has been found. 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 Consider follow-up CT abdomen without contrast in 3-4 weeks for confirmation. If patient is dialysis dependent and able to receive intravenous contrast, consider CT abdomen with/without contrast.** Electronically Signed   By: Julian Hy M.D.   On: 11/02/2016 15:32   Dg Chest Port 1 View  Result Date: 11/02/2016 CLINICAL DATA:  Sepsis EXAM: PORTABLE CHEST 1 VIEW COMPARISON:  10/28/2016 FINDINGS: AP portable semi-erect view chest demonstrates non inclusion of the right CP angle. There is no focal infiltrate or effusion. There is stable cardiomegaly. Oval opacity in the left upper lobe could reflect rib end.  IMPRESSION: Cardiomegaly with mild central congestion.  No acute infiltrates. Electronically Signed   By: Donavan Foil M.D.   On: 11/02/2016 15:56      Lab Results  Component Value Date   HGBA1C 7.1 (H) 10/12/2016   HGBA1C 11.1 (H) 02/25/2015   HGBA1C 6.9 03/10/2009   Lab Results  Component Value Date   MICROALBUR 120.9 12/04/2008   LDLCALC 35 11/03/2016   CREATININE 6.39 (H) 11/03/2016       Scheduled Meds: . aspirin EC  81 mg Oral Daily  . calcitRIOL  0.75 mcg Oral Q M,W,F-HD  . calcium acetate  2,668 mg Oral TID WC  . ceFEPime (MAXIPIME) IV  1 g Intravenous Q24H  . feeding supplement (PRO-STAT SUGAR FREE 64)  30 mL Oral BID  . fluconazole (DIFLUCAN) IV  200 mg Intravenous Q24H  . insulin aspart  0-9 Units Subcutaneous TID WC  . insulin detemir  80 Units Subcutaneous q morning - 10a  . isosorbide mononitrate  30 mg Oral Daily  . metoprolol tartrate  25 mg Oral BID  . midodrine  10 mg Oral TID WC  . multivitamin  1 tablet Oral QHS  . nystatin cream   Topical BID  . simvastatin  40 mg Oral QHS  . sodium chloride  500 mL Intravenous Once  . sodium chloride flush  3 mL Intravenous Q12H  . [START ON 11/05/2016] vancomycin  1,000 mg Intravenous Q M,W,F-HD  . Warfarin - Pharmacist Dosing Inpatient   Does not apply q1800   Continuous Infusions:   LOS: 2 days    Time spent: >30 MINS    [facility] Pager [phone]. If 7PM-7AM, please contact night-coverage at [email or web address], password [facility] 11/04/2016, 9:51 AM  LOS: 2 days

## 2016-11-04 NOTE — Progress Notes (Signed)
  Echocardiogram 2D Echocardiogram has been performed.  Jennette Dubin 11/04/2016, 10:16 AM

## 2016-11-04 NOTE — Progress Notes (Signed)
Pharmacy Antibiotic Note  Kenneth [name] is a 66 y.o. male admitted on 11/02/2016 with concern for sepsis related to penile infection.  Pharmacy has been consulted to dose Fluconazole + Vancomycin + Cefepime. ESRD with plans for extra HD off-schedule today.  The patient was also continued on warfarin from PTA for hx Aflutter. INR today is SUBtherapeutic after reversal with Vit K 2.5 mg  (INR 1.89 << 4.43, goal of 2-3). Fluconazole is noted to start today which will increase warfarin sensitivity - will need empiric dose reduction. Will give a low dose today.  Plan: 1. Vancomycin 500 mg IV x 1 after extra HD session today 2. Adjust Vancomycin to 750 mg post HD-MWF 3. Continue Cefepime 1g IV every 24 hours 4. Continue Fluconazole 200 mg IV every 24 hours 5. Warfarin 2.5 mg x 1 dose today 6. Will continue to follow HD schedule/duration, culture results, LOT, and antibiotic de-escalation plans 7. Will continue to monitor for any signs/symptoms of bleeding and will follow up with PT/INR in the a.m.    Height: 5\' 8"  (172.7 cm) Weight: 175 lb (79.4 kg) IBW/kg (Calculated) : 68.4  Temp (24hrs), Avg:98.2 F (36.8 C), Min:97.7 F (36.5 C), Max:98.4 F (36.9 C)   Recent Labs Lab 10/28/16 1712 11/02/16 1226 11/02/16 1237 11/02/16 1634 11/02/16 1645 11/03/16 0824  WBC 8.5 18.1*  --   --  18.3* 19.4*  CREATININE 9.20* 5.35*  --   --   --  6.39*  LATICACIDVEN  --   --  2.99* 1.93*  --   --     Estimated Creatinine Clearance: 11.2 mL/min (A) (by C-G formula based on SCr of 6.39 mg/dL (H)).    Allergies  Allergen Reactions  . Penicillins Other (See Comments)    Reaction:  Unknown  Has patient had a PCN reaction causing immediate rash, facial/tongue/throat swelling, SOB or lightheadedness with hypotension: Unsure Has patient had a PCN reaction causing severe rash involving mucus membranes or skin necrosis: Unsure Has patient had a PCN reaction that required hospitalization Unsure  Has  patient had a PCN reaction occurring within the last 10 years: No If all of the above answers are "NO", then may proceed with Cephalosporin use.    Antimicrobials this admission: Cefepime 3/20 >> Vancomycin 3/20 >> Fluconazole 3/21 >>  Microbiology results: 3/20 BCx >>  Thank you for allowing pharmacy to be a part of this patient's care.  Alycia Rossetti, PharmD, BCPS Clinical Pharmacist Pager: [phone] Clinical phone for 11/04/2016 from 7a-3:30p: [phone] If after 3:30p, please call main pharmacy at: [phone] 11/04/2016 1:03 PM

## 2016-11-04 NOTE — Progress Notes (Signed)
[facility] Progress Note   Subjective: pt tired, no new c/o's  Vitals:   11/03/16 2109 11/03/16 2215 11/04/16 0426 11/04/16 1024  BP: (!) 164/51  (!) 86/61 (!) 93/59  Pulse: (!) 120  (!) 118 (!) 120  Resp: 18  17 20   Temp: 98.4 F (36.9 C)  97.7 F (36.5 C) 98.2 F (36.8 C)  TempSrc:    Oral  SpO2: 98%  98% 97%  Weight:  79.4 kg (175 lb)    Height:        Inpatient medications: . aspirin EC  81 mg Oral Daily  . calcitRIOL  0.75 mcg Oral Q M,W,F-HD  . calcium acetate  2,668 mg Oral TID WC  . ceFEPime (MAXIPIME) IV  1 g Intravenous Q24H  . feeding supplement (PRO-STAT SUGAR FREE 64)  30 mL Oral BID  . fluconazole (DIFLUCAN) IV  200 mg Intravenous Q24H  . insulin aspart  0-9 Units Subcutaneous TID WC  . [START ON 11/05/2016] insulin detemir  40 Units Subcutaneous q morning - 10a  . isosorbide mononitrate  30 mg Oral Daily  . metoprolol tartrate  25 mg Oral BID  . midodrine  10 mg Oral TID WC  . multivitamin  1 tablet Oral QHS  . nystatin cream   Topical BID  . simvastatin  40 mg Oral QHS  . sodium chloride  500 mL Intravenous Once  . sodium chloride flush  3 mL Intravenous Q12H  . vancomycin  500 mg Intravenous Q Thu-HD  . [START ON 11/05/2016] vancomycin  1,000 mg Intravenous Q M,W,F-HD  . warfarin  2.5 mg Oral ONCE-1800  . Warfarin - Pharmacist Dosing Inpatient   Does not apply q1800   . dextrose 75 mL/hr at 11/04/16 1513   sodium chloride, sodium chloride, acetaminophen, alteplase, calcium acetate, lidocaine (PF), lidocaine-prilocaine, ondansetron **OR** ondansetron (ZOFRAN) IV, oxyCODONE, pentafluoroprop-tetrafluoroeth, polyethylene glycol  Exam: General: Ill-appearing elderly WM dozing off during questioning  Head: NCAT sclera not icteric MMM Neck: Supple. No JVD No masses Lungs:  Breathing is unlabored. Lung sounds diminished Faint crackles at bases  Heart: Tachy  with S1 S2 Abdomen: soft NT + BS GU: Necrotic penile lesion  Lower extremities:  4+ pitting edema with scattered erythematous weeping lesions  Neuro: Unable to assess  Psych:  Responds to questions but groggy  Dialysis Access: LUE AVF +bruit/thrill    Dialysis: [facility] MWF 4h 1min  F200   BFR450   2/2.25  P4  94.5kg   Hep 7000  LUE AVF -Calcitriol 0.75 mcg PO q HD -Venofer 50mg  IV q week       Assessment: 1. Sepsis/ necrotic penile infection - not surgical candidate per urology.   2. Hypotension - due to sepsis primarily 3. Atrial flutter  4. ESRD on HD 5. Vol overload/ marked LE edema - unable to pull off due to hypotension 6. DM  7. Anemia - Hb 11, not on ESA  Plan - pt very ill and not good surgical candidate for necrotic GU infection.  Doing dialysis under these conditions will not likely be effective and could make things worse.  Have d/w pt's daughter who says her father is "tired". She is making decisions and is leaning towards comfort care. She wants to discuss with her siblings first.  Will hold off on HD for now.    Kelly Splinter MD [facility] pager [phone]   11/04/2016, 3:17 PM    Recent Labs Lab 11/02/16 1226 11/03/16 9924 11/04/16 1222  NA 135 136 137  K 3.6 3.9 3.9  CL 90* 94* 99*  CO2 25 25 25   GLUCOSE 168* 164* 65  BUN 29* 45* 43*  CREATININE 5.35* 6.39* 5.08*  CALCIUM 8.6* 8.5* 8.1*    Recent Labs Lab 11/02/16 1226 11/03/16 0824 11/04/16 1222  AST 43* 43* 116*  ALT 42 37 64*  ALKPHOS 86 76 147*  BILITOT 1.1 1.2 1.2  PROT 6.5 5.7* 5.6*  ALBUMIN 2.4* 2.0* 1.7*    Recent Labs Lab 10/28/16 1712 11/02/16 1226 11/02/16 1645 11/03/16 0824 11/04/16 1222  WBC 8.5 18.1* 18.3* 19.4* 16.2*  NEUTROABS 7.2 17.3* 16.5*  --   --   HGB 11.1* 12.4* 11.9* 12.0* 11.2*  HCT 32.7* 37.5* 35.5* 36.7* 34.3*  MCV 91.1 91.5 91.0 92.0 94.2  PLT 139* 154 113* 107* 76*   Iron/TIBC/Ferritin/ %Sat No results found for: IRON, TIBC, FERRITIN, IRONPCTSAT

## 2016-11-04 NOTE — Progress Notes (Signed)
Physical Therapy Evaluation Patient Details Name: Kenneth [name] MRN: [id number] DOB: 1951-02-04 Today's Date: 11/04/2016   History of Present Illness  pt is a 66 y/o male with pmh significant for ESRD on HD, DM2, HTN, TIA, recent circumcision and debridement of glans penis, admitted to ED from HD with increased swelling of the LE and weakness, dropping BP's.  Pt severely septic in shock likely from penile infection.  Clinical Impression  Pt admitted with/for severe sepsis.  Pt is delirious and a total assist at present.  Pt currently limited functionally due to the problems listed. ( See problems list.)   Pt will benefit from PT to maximize function and safety in order to get ready for next venue listed below.     Follow Up Recommendations SNF;Other (comment) (there is talk of moving toward comfort care)    Equipment Recommendations  None recommended by PT;Other (comment) (TBA)    Recommendations for Other Services       Precautions / Restrictions Precautions Precautions: Fall      Mobility  Bed Mobility Overal bed mobility: Needs Assistance Bed Mobility: Supine to Sit;Sit to Supine     Supine to sit: Total assist Sit to supine: Total assist   General bed mobility comments: pt would attempt to help when he understood what was asked of him, but too weak and unfocused to help more than a trace.  Transfers                 General transfer comment: not tested due to unsafe with pt's cognitive status and only one therapist  Ambulation/Gait             General Gait Details: not able  Stairs            Wheelchair Mobility    Modified Rankin (Stroke Patients Only)       Balance Overall balance assessment: Needs assistance Sitting-balance support: Feet supported;Bilateral upper extremity supported Sitting balance-Leahy Scale: Poor Sitting balance - Comments: sat EOB for 5-8 min trying to elicit truncal reactions and LE movement.  Pt needed min to  mod assist for balance at the least.                                     Pertinent Vitals/Pain Pain Assessment: Faces Faces Pain Scale: Hurts even more Pain Location: L Leg and ?perineal areas with movement Pain Descriptors / Indicators: Grimacing;Guarding;Moaning Pain Intervention(s): Limited activity within patient's tolerance;Repositioned;Monitored during session    Home Living Family/patient expects to be discharged to:: Private residence Living Arrangements: Parent Available Help at Discharge: Family Type of Home: House           Additional Comments: no family present to provide PLOF    Prior Function                 Hand Dominance        Extremity/Trunk Assessment   Upper Extremity Assessment Upper Extremity Assessment: Generalized weakness    Lower Extremity Assessment Lower Extremity Assessment: Generalized weakness (little active movement)       Communication   Communication: Other (comment) (pt speak softly in yes and no's)  Cognition Arousal/Alertness: Lethargic Behavior During Therapy: Flat affect Overall Cognitive Status: Impaired/Different from baseline Area of Impairment: Orientation;Attention;Following commands;Safety/judgement;Awareness;Problem solving Orientation Level: Disoriented to;Person;Place;Time;Situation Current Attention Level: Focused   Following Commands: Follows one step commands inconsistently Safety/Judgement: Decreased awareness of safety;Decreased awareness  of deficits Awareness: Intellectual Problem Solving: Slow processing;Decreased initiation;Difficulty sequencing;Requires verbal cues;Requires tactile cues General Comments: mostly delerious    General Comments General comments (skin integrity, edema, etc.): pt with eyes open 100%, but still delerious and not able to follow verbal direction without physical cues and direction.    Exercises Other Exercises Other Exercises: PROM to bil LE's and basic  movement of UE's during function.   Assessment/Plan    PT Assessment Patient needs continued PT services  PT Problem List Decreased strength;Decreased activity tolerance;Decreased balance;Decreased mobility;Decreased coordination;Decreased cognition;Pain;Decreased skin integrity       PT Treatment Interventions Functional mobility training;Therapeutic activities;Therapeutic exercise;Balance training;Patient/family education    PT Goals (Current goals can be found in the Care Plan section)  Acute Rehab PT Goals Patient Stated Goal: pt unable to participate PT Goal Formulation: Patient unable to participate in goal setting Time For Goal Achievement: 11/11/16 Potential to Achieve Goals: Fair    Frequency Min 2X/week   Barriers to discharge        Co-evaluation               End of Session   Activity Tolerance: Patient limited by lethargy;Patient limited by pain;Patient limited by fatigue Patient left: in bed;with call bell/phone within reach;with bed alarm set Nurse Communication: Mobility status;Need for lift equipment PT Visit Diagnosis: Muscle weakness (generalized) (M62.81);Pain Pain - Right/Left: Left Pain - part of body: Leg (bil LE)         Time: 7564-3329 PT Time Calculation (min) (ACUTE ONLY): 25 min   Charges:   PT Evaluation $PT Eval High Complexity: 1 Procedure PT Treatments $Therapeutic Activity: 8-22 mins   PT G CodesTessie Fass [place] 11/04/2016, 6:35 PM  11/04/2016  Donnella Sham, [place] [phone]  (pager)

## 2016-11-04 NOTE — Progress Notes (Signed)
Inpatient Diabetes Program Recommendations  AACE/ADA: New Consensus Statement on Inpatient Glycemic Control (2015)  Target Ranges:  Prepandial:   less than 140 mg/dL      Peak postprandial:   less than 180 mg/dL (1-2 hours)      Critically ill patients:  140 - 180 mg/dL   Lab Results  Component Value Date   GLUCAP 33 (LL) 11/04/2016   HGBA1C 7.1 (H) 10/12/2016    Review of Glycemic Control  Diabetes history: DM2, ESRD on HD Outpatient Diabetes medications: Levemir 80 units QHS (ordered as 30 units daily) Current orders for Inpatient glycemic control: Novolog 0-9 units TID AC and Levemir 40 units to start Friday at 1000.  Inpatient Diabetes Program Recommendations:   Insulin - Basal: Noted patient given home dose of Levemir 80 units when admitted.  Noted 2 hypoglycemic events afterwards.  Noted 50% reduction in Levemir to 40 units daily to start Friday at 1000.  Correction (SSI): Noted Novolog 0-9 units TID AC.  Spoke with patient's daughter at bedside.  Patient had just received poor prognosis.  Patient's daughter stated that patient was most likely not taking insulin correctly at home.    Thank you,  Windy Carina, RN, MSN Diabetes Coordinator Inpatient Diabetes Program [phone] (Team Pager)

## 2016-11-04 NOTE — Progress Notes (Signed)
Hypoglycemic Event  CBG: 33  Treatment: D50 IV 50 mL  Symptoms: lethargic  Follow-up CBG: Time:1128 CBG Result:81  Possible Reasons for medication Comments/MD notified:Dr. Abrol notified will continue to monitor patient. Will place patient on a Momence

## 2016-11-04 NOTE — Progress Notes (Signed)
Hypoglycemic Event  CBG: 32  Treatment: D50 IV 50 mL  Symptoms: Lethargic  Follow-up CBG: GFRE:3200 CBG Result:92  Possible Reasons for Event: medication  Comments/MD notified:Dr. Abrol notified. Will continue to monitor patient    Theodosia Paling

## 2016-11-04 NOTE — Progress Notes (Signed)
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   Continue dressing changes.   With his comorbidities he is not a good candidate for more aggressive debridement or partial penectomy because it is unlikely he would tolerate the anesthesia or have successful healing.    Penile necrosis is seen primarily in individuals with DM and ESRD and is associated with a poor prognosis.   Subjective: Kenneth [name] has necrosis of the glans penis with prior circumcision to attempt to allow the area of necrosis to dry and heal.  He had debridement at the bedside and is now getting dressing changes.   I was unable to wake him easily this morning.    ROS:  Review of Systems  Unable to perform ROS: Medical condition    Anti-infectives: Anti-infectives    Start     Dose/Rate Route Frequency Ordered Stop   11/05/16 1200  vancomycin (VANCOCIN) IVPB 1000 mg/200 mL premix     1,000 mg 200 mL/hr over 60 Minutes Intravenous Every M-W-F (Hemodialysis) 11/03/16 1308     11/04/16 2200  fluconazole (DIFLUCAN) IVPB 200 mg     200 mg 100 mL/hr over 60 Minutes Intravenous Every 24 hours 11/03/16 0846     11/03/16 1800  ceFEPIme (MAXIPIME) 1 g in dextrose 5 % 50 mL IVPB     1 g 100 mL/hr over 30 Minutes Intravenous Every 24 hours 11/02/16 1218     11/03/16 1400  fluconazole (DIFLUCAN) IVPB 200 mg     200 mg 100 mL/hr over 60 Minutes Intravenous  Once 11/03/16 0941 11/03/16 1522   11/03/16 1200  vancomycin (VANCOCIN) IVPB 1000 mg/200 mL premix     1,000 mg 200 mL/hr over 60 Minutes Intravenous Every M-W-F (Hemodialysis) 11/02/16 2205 11/03/16 1406   11/03/16 1000  fluconazole (DIFLUCAN) IVPB 100 mg  Status:  Discontinued     100 mg 50 mL/hr over 60 Minutes Intravenous 2 times daily 11/03/16 0846 11/03/16 0941   11/02/16 1230  ceFEPIme (MAXIPIME) 2 g in dextrose 5 % 50 mL IVPB     2 g 100 mL/hr over 30 Minutes Intravenous  Once 11/02/16 1218 11/02/16 1316   11/02/16 1215  levofloxacin (LEVAQUIN) IVPB  750 mg  Status:  Discontinued     750 mg 100 mL/hr over 90 Minutes Intravenous  Once 11/02/16 1209 11/02/16 1218   11/02/16 1215  aztreonam (AZACTAM) 2 g in dextrose 5 % 50 mL IVPB  Status:  Discontinued     2 g 100 mL/hr over 30 Minutes Intravenous  Once 11/02/16 1209 11/02/16 1218   11/02/16 1215  vancomycin (VANCOCIN) IVPB 1000 mg/200 mL premix  Status:  Discontinued     1,000 mg 200 mL/hr over 60 Minutes Intravenous  Once 11/02/16 1209 11/02/16 1214   11/02/16 1215  vancomycin (VANCOCIN) 2,000 mg in sodium chloride 0.9 % 500 mL IVPB     2,000 mg 250 mL/hr over 120 Minutes Intravenous  Once 11/02/16 1214 11/02/16 1603      Current Facility-Administered Medications  Medication Dose Route Frequency Provider Last Rate Last Dose  . 0.9 %  sodium chloride infusion  100 mL Intravenous PRN Thomos Lemons Ejigiri, PA-C      . 0.9 %  sodium chloride infusion  100 mL Intravenous PRN Lynnda Child, PA-C      . acetaminophen (TYLENOL) tablet 1,000 mg  1,000 mg Oral Q6H PRN Brenton Grills, PA-C      . alteplase (CATHFLO ACTIVASE) injection 2 mg  2 mg Intracatheter Once PRN Lynnda Child, PA-C      . aspirin EC tablet 81 mg  81 mg Oral Daily Brenton Grills, PA-C   81 mg at 11/03/16 1420  . calcitRIOL (ROCALTROL) capsule 0.75 mcg  0.75 mcg Oral Q M,W,F-HD Lynnda Child, PA-C   0.75 mcg at 11/03/16 1306  . calcium acetate (PHOSLO) capsule 2,668 mg  2,668 mg Oral TID WC Brenton Grills, PA-C   2,668 mg at 11/03/16 1729  . calcium acetate (PHOSLO) capsule 102-5,852 mg  778-2,423 mg Oral TID PRN Brenton Grills, PA-C      . ceFEPIme (MAXIPIME) 1 g in dextrose 5 % 50 mL IVPB  1 g Intravenous Q24H Alfonzo Beers, MD   1 g at 11/03/16 1643  . feeding supplement (PRO-STAT SUGAR FREE 64) liquid 30 mL  30 mL Oral BID Thomos Lemons Ejigiri, PA-C   30 mL at 11/03/16 1000  . fluconazole (DIFLUCAN) IVPB 200 mg  200 mg Intravenous Q24H Rolla Flatten, [facility]      . insulin aspart  (novoLOG) injection 0-9 Units  0-9 Units Subcutaneous TID WC Brenton Grills, PA-C   1 Units at 11/03/16 1730  . insulin detemir (LEVEMIR) injection 80 Units  80 Units Subcutaneous q morning - 10a Brenton Grills, PA-C   80 Units at 11/03/16 1421  . isosorbide mononitrate (IMDUR) 24 hr tablet 30 mg  30 mg Oral Daily Brenton Grills, PA-C      . lidocaine (PF) (XYLOCAINE) 1 % injection 5 mL  5 mL Intradermal PRN Lynnda Child, PA-C      . lidocaine-prilocaine (EMLA) cream 1 application  1 application Topical PRN Lynnda Child, PA-C      . metoprolol tartrate (LOPRESSOR) tablet 25 mg  25 mg Oral BID Brenton Grills, PA-C   25 mg at 11/03/16 2153  . midodrine (PROAMATINE) tablet 10 mg  10 mg Oral TID WC Roney Jaffe, MD      . multivitamin (RENA-VIT) tablet 1 tablet  1 tablet Oral QHS Brenton Grills, PA-C   1 tablet at 11/03/16 2153  . nystatin cream (MYCOSTATIN)   Topical BID Ardis Hughs, MD      . ondansetron [facility]) tablet 4 mg  4 mg Oral Q6H PRN Brenton Grills, PA-C       Or  . ondansetron [facility]) injection 4 mg  4 mg Intravenous Q6H PRN Brenton Grills, PA-C      . oxyCODONE (Oxy IR/ROXICODONE) immediate release tablet 5 mg  5 mg Oral Q6H PRN Brenton Grills, PA-C   5 mg at 11/03/16 1729  . pentafluoroprop-tetrafluoroeth (GEBAUERS) aerosol 1 application  1 application Topical PRN Thomos Lemons Ejigiri, PA-C      . polyethylene glycol (MIRALAX / GLYCOLAX) packet 17 g  17 g Oral Daily PRN Brenton Grills, PA-C      . simvastatin (ZOCOR) tablet 40 mg  40 mg Oral QHS Brenton Grills, PA-C   40 mg at 11/03/16 2155  . sodium chloride 0.9 % bolus 500 mL  500 mL Intravenous Once Brenton Grills, PA-C   Stopped at 11/02/16 1641  . sodium chloride flush (NS) 0.9 % injection 3 mL  3 mL Intravenous Q12H Brenton Grills, PA-C   3 mL at 11/03/16 1000  . [START ON 11/05/2016] vancomycin (VANCOCIN) IVPB 1000 mg/200 mL premix  1,000 mg Intravenous Q  M,W,F-HD Rolla Flatten, [facility]      .  Warfarin - Pharmacist Dosing Inpatient   Does not apply q1800 Delane Ginger, [facility]   Stopped at 11/03/16 1800     Objective: Vital signs in last 24 hours: Temp:  [97.7 F (36.5 C)-98.4 F (36.9 C)] 97.7 F (36.5 C) (03/22 0426) Pulse Rate:  [70-126] 118 (03/22 0426) Resp:  [16-20] 17 (03/22 0426) BP: (84-164)/(45-79) 86/61 (03/22 0426) SpO2:  [94 %-100 %] 98 % (03/22 0426) Weight:  [79.4 kg (175 lb)-93.5 kg (206 lb 2.1 oz)] 79.4 kg (175 lb) (03/21 2215)  Intake/Output from previous day: 03/21 0701 - 03/22 0700 In: 150 [IV Piggyback:150] Out: 800  Intake/Output this shift: No intake/output data recorded.   Physical Exam  Constitutional:  Ill appearing WD WM, sleeping and not easily arousable.   Genitourinary:  Genitourinary Comments: The glans is retracted within the foreskin/scrotum with a packing in place.  There is induration of the penis and I could not easily expose the glans.   There is no purulent drainage from the foreskin and no surrounding cellulitis but it is malodorous.   Neurological:  Sleeping and not easily arousable.   Vitals reviewed.   Lab Results:   Recent Labs  11/02/16 1645 11/03/16 0824  WBC 18.3* 19.4*  HGB 11.9* 12.0*  HCT 35.5* 36.7*  PLT 113* 107*   BMET  Recent Labs  11/02/16 1226 11/03/16 0824  NA 135 136  K 3.6 3.9  CL 90* 94*  CO2 25 25  GLUCOSE 168* 164*  BUN 29* 45*  CREATININE 5.35* 6.39*  CALCIUM 8.6* 8.5*   PT/INR  Recent Labs  11/03/16 0824 11/03/16 1200  LABPROT 40.0* 43.9*  INR 4.00 4.49*   ABG No results for input(s): PHART, HCO3 in the last 72 hours.  Invalid input(s): PCO2, PO2  Studies/Results: Ct Pelvis W Contrast  Result Date: 11/02/2016 CLINICAL DATA:  Penile debridement 2 weeks ago, redness/swelling/itching x2 weeks EXAM: CT PELVIS WITH CONTRAST TECHNIQUE: Multidetector CT imaging of the pelvis was performed using the standard protocol following the bolus  administration of intravenous contrast. CONTRAST:  75 mL Isovue 300 IV COMPARISON:  None. FINDINGS: Urinary Tract:  Bladder is within normal limits. 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 Bowel:  Mild sigmoid diverticulosis. Visualized bowel is otherwise unremarkable. Vascular/Lymphatic: Mild atherosclerotic calcifications. 2.0 cm short axis inferior left inguinal node (series 301/image 59). Additional small bilateral inguinal nodes are favored to be reactive. Reproductive:  Prostate is grossly unremarkable. Penis is grossly unremarkable on CT, noting postsurgical changes related to recent circumcision. Other:  Trace pelvic ascites. Mild body wall edema. Musculoskeletal: Mild degenerative changes of the lower lumbar spine. IMPRESSION: Postsurgical changes related to recent penile circumcision. No evidence of fluid collection/abscess. Dominant 2 cm short axis left inferior inguinal node, presumably inflammatory/reactive. Consider clinical follow-up to ensure resolution. **An incidental finding of potential clinical significance has been found. 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 Consider follow-up CT abdomen without contrast in 3-4 weeks for confirmation. If patient is dialysis dependent and able to receive intravenous contrast, consider CT abdomen with/without contrast.** Electronically Signed   By: Julian Hy M.D.   On: 11/02/2016 15:32   Dg Chest Port 1 View  Result Date: 11/02/2016 CLINICAL DATA:  Sepsis EXAM: PORTABLE CHEST 1 VIEW COMPARISON:  10/28/2016 FINDINGS: AP portable semi-erect view chest demonstrates  non inclusion of the right CP angle. There is no focal infiltrate or effusion. There is stable cardiomegaly. Oval opacity in the left upper lobe could reflect rib end.  IMPRESSION: Cardiomegaly with mild central congestion.  No acute infiltrates. Electronically Signed   By: Donavan Foil M.D.   On: 11/02/2016 15:56          LOS: 2 days    Malka So 11/04/2016 [phone] ID: Kenneth [name], male   DOB: Dec 25, 1950, 66 y.o.   MRN: [id number]

## 2016-11-05 DIAGNOSIS — E785 Hyperlipidemia, unspecified: Secondary | ICD-10-CM

## 2016-11-05 DIAGNOSIS — I10 Essential (primary) hypertension: Secondary | ICD-10-CM

## 2016-11-05 DIAGNOSIS — Z515 Encounter for palliative care: Secondary | ICD-10-CM

## 2016-11-05 DIAGNOSIS — N4829 Other inflammatory disorders of penis: Secondary | ICD-10-CM

## 2016-11-05 DIAGNOSIS — R600 Localized edema: Secondary | ICD-10-CM

## 2016-11-05 DIAGNOSIS — R6520 Severe sepsis without septic shock: Secondary | ICD-10-CM

## 2016-11-05 LAB — PROTIME-INR
INR: 1.81
Prothrombin Time: 21.3 s — ABNORMAL HIGH (ref 11.4–15.2)

## 2016-11-05 LAB — GLUCOSE, CAPILLARY
GLUCOSE-CAPILLARY: 124 mg/dL — AB (ref 65–99)
GLUCOSE-CAPILLARY: 122 mg/dL — AB (ref 65–99)
Glucose-Capillary: 68 mg/dL (ref 65–99)
Glucose-Capillary: 52 mg/dL — ABNORMAL LOW (ref 65–99)
Glucose-Capillary: 114 mg/dL — ABNORMAL HIGH (ref 65–99)
Glucose-Capillary: 118 mg/dL — ABNORMAL HIGH (ref 65–99)

## 2016-11-05 MED ORDER — DEXTROSE 50 % IV SOLN
INTRAVENOUS | Status: AC
  Administered 2016-11-05: 07:00:00
  Filled 2016-11-05: qty 50

## 2016-11-05 MED ORDER — VANCOMYCIN HCL IN DEXTROSE 750-5 MG/150ML-% IV SOLN
750.0000 mg | INTRAVENOUS | Status: DC
  Filled 2016-11-05: qty 150

## 2016-11-05 MED ORDER — GLYCOPYRROLATE 0.2 MG/ML IJ SOLN: 0.2000 mg | INTRAMUSCULAR | Status: DC | PRN

## 2016-11-05 MED ORDER — HYDROMORPHONE HCL 1 MG/ML IJ SOLN: 0.5000 mg | INTRAMUSCULAR | Status: DC | PRN

## 2016-11-05 MED ORDER — DEXTROSE 5 % IV SOLN
500.0000 mg | INTRAVENOUS | Status: DC
  Administered 2016-11-05: 500 mg via INTRAVENOUS
  Filled 2016-11-05 (×2): qty 0.5

## 2016-11-05 MED ORDER — FLUCONAZOLE IN SODIUM CHLORIDE 100-0.9 MG/50ML-% IV SOLN
100.0000 mg | INTRAVENOUS | Status: DC
  Administered 2016-11-05: 100 mg via INTRAVENOUS
  Filled 2016-11-05: qty 50

## 2016-11-05 MED ORDER — DEXTROSE 50 % IV SOLN
INTRAVENOUS | Status: AC
  Administered 2016-11-05: 50 mL
  Filled 2016-11-05: qty 50

## 2016-11-05 MED ORDER — LORAZEPAM 2 MG/ML IJ SOLN: 0.5000 mg | Freq: Four times a day (QID) | INTRAMUSCULAR | Status: DC | PRN

## 2016-11-05 NOTE — Progress Notes (Addendum)
PROGRESS NOTE    Kenneth [name]  NLZ:[id number] DOB: [birth year] DOA: 11/02/2016 PCP: [place]   Chief Complaint  Patient presents with  . Vascular Access Problem  . Leg Swelling     Brief Narrative:   Assessment & Plan   Severe sepsis with septic shock -most likely d/t penile infection/left leg cellulitis -S/p IVF, but continues to have tachycardia and hypotension.  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 Penile necrosis is seen primarily in individuals with DM and ESRD and is associated with a poor prognosis per urology  -needs aggressive wound care -Continue treatment as above -Pelvic CT: no evidence of fluid collection or abscess, ?benign renal AML. Consider f/up CT abd in 3-4weeks without contrast  Left Leg cellulitis -Continue IV antibiotics and ask wound care assess with dressing  ESRD - nephrology following, on Monday Wednesday Friday,now not a good candidate for HD per Kenneth [name] -HD on MWF -Nephrology consulted and appreciated -Given hypotension, necrotic GU infection, severity of patient's illness, he currently is not a good surgical candidate and undergoing dialysis under these conditions will not be effective and can make situation worse.  -Palliative care consulted  Atrial flutter, chronic  -Continue coumadin per pharmacy -INR currently 1.81 -Of note, patient was given Vit K for supratherapeutic INR   Diabetes mellitus, type II complicated by episodes of hypoglycemia -HgbA1C is 7.1% on 10/12/2016 -Patient has had episodes of hypoglycemia -Will hold levemir -Continue ISS and CBG monitoring -Patient currently on D10 due to hypoglycemia and was given glucagon   Essential Hypertension  -currently hypotensive.  Hyperlipidemia   -Continue statin  Thrombocytopenia -platelets dropped to 76 on  11/04/2016 -Continue to monitor CBC -Currently on coumadin, however, oral intake has been limited  Goals of care -Palliative care consulted and appreciated, pending -Patient currently DNR -patient does not appear to be a candidate for surgery and has not been receiving hemodialysis given his current vitals and infection  DVT Prophylaxis  Coumadin  Code Status: DNR  Family Communication: None at bedside  Disposition Plan: Admitted. Pending palliative care consult  Consultants Nephrology Palliative care Urology   Procedures  Echocardiogram I&D at bedside by Urology  Antibiotics   Anti-infectives    Start     Dose/Rate Route Frequency Ordered Stop   11/05/16 1200  vancomycin (VANCOCIN) IVPB 1000 mg/200 mL premix  Status:  Discontinued     1,000 mg 200 mL/hr over 60 Minutes Intravenous Every M-W-F (Hemodialysis) 11/03/16 1308 11/05/16 1004   11/05/16 1200  vancomycin (VANCOCIN) IVPB 750 mg/150 ml premix     750 mg 150 mL/hr over 60 Minutes Intravenous Every M-W-F (Hemodialysis) 11/05/16 1004     11/04/16 2200  fluconazole (DIFLUCAN) IVPB 200 mg     200 mg 100 mL/hr over 60 Minutes Intravenous Every 24 hours 11/03/16 0846     11/04/16 1400  vancomycin (VANCOCIN) 500 mg in sodium chloride 0.9 % 100 mL IVPB  Status:  Discontinued     500 mg 100 mL/hr over 60 Minutes Intravenous Every Thu (Hemodialysis) 11/04/16 1306 11/05/16 1003   11/03/16 1800  ceFEPIme (MAXIPIME) 1 g in dextrose 5 % 50 mL IVPB     1 g 100 mL/hr over 30 Minutes Intravenous Every 24 hours 11/02/16 1218     11/03/16 1400  fluconazole (DIFLUCAN) IVPB 200 mg     200 mg 100 mL/hr over  60 Minutes Intravenous  Once 11/03/16 0941 11/03/16 1522   11/03/16 1200  vancomycin (VANCOCIN) IVPB 1000 mg/200 mL premix     1,000 mg 200 mL/hr over 60 Minutes Intravenous Every M-W-F (Hemodialysis) 11/02/16 2205 11/03/16 1406   11/03/16 1000  fluconazole (DIFLUCAN) IVPB 100 mg  Status:  Discontinued     100 mg 50 mL/hr over  60 Minutes Intravenous 2 times daily 11/03/16 0846 11/03/16 0941   11/02/16 1230  ceFEPIme (MAXIPIME) 2 g in dextrose 5 % 50 mL IVPB     2 g 100 mL/hr over 30 Minutes Intravenous  Once 11/02/16 1218 11/02/16 1316   11/02/16 1215  levofloxacin (LEVAQUIN) IVPB 750 mg  Status:  Discontinued     750 mg 100 mL/hr over 90 Minutes Intravenous  Once 11/02/16 1209 11/02/16 1218   11/02/16 1215  aztreonam (AZACTAM) 2 g in dextrose 5 % 50 mL IVPB  Status:  Discontinued     2 g 100 mL/hr over 30 Minutes Intravenous  Once 11/02/16 1209 11/02/16 1218   11/02/16 1215  vancomycin (VANCOCIN) IVPB 1000 mg/200 mL premix  Status:  Discontinued     1,000 mg 200 mL/hr over 60 Minutes Intravenous  Once 11/02/16 1209 11/02/16 1214   11/02/16 1215  vancomycin (VANCOCIN) 2,000 mg in sodium chloride 0.9 % 500 mL IVPB     2,000 mg 250 mL/hr over 120 Minutes Intravenous  Once 11/02/16 1214 11/02/16 1603      Subjective:   Kenneth [name] seen and examined today.  Patient not interactive this morning. States he is tired. Does not answer any questions.   Objective:   Vitals:   11/04/16 2217 11/05/16 0418 11/05/16 0917 11/05/16 1000  BP: 93/65 (!) 65/51 (!) 81/55 (!) 82/55  Pulse: (!) 110 (!) 106 (!) 112   Resp: 14 16 15    Temp: 97.6 F (36.4 C) 98.6 F (37 C) 98.1 F (36.7 C)   TempSrc:   Oral   SpO2: 93% 96% 94%   Weight:      Height:        Intake/Output Summary (Last 24 hours) at 11/05/16 1119 Last data filed at 11/05/16 1022  Gross per 24 hour  Intake          1244.58 ml  Output                0 ml  Net          1244.58 ml   Filed Weights   11/03/16 0936 11/03/16 1336 11/03/16 2215  Weight: 93.5 kg (206 lb 2.1 oz) 92 kg (202 lb 13.2 oz) 79.4 kg (175 lb)    Exam  General: Well developed, chronically ill appearing  HEENT: NCAT,  mucous membranes moist.   Cardiovascular: S1 S2 auscultated, tachycardic  Respiratory: Diminished breath sounds anteriorly  Abdomen: Soft, nontender,  nondistended, + bowel sounds  GU: deferred  Extremities: warm dry without cyanosis clubbing. 3+ pitting edema, LLE wrapped  Neuro: unable to assess   Data Reviewed: I have personally reviewed following labs and imaging studies  CBC:  Recent Labs Lab 11/02/16 1226 11/02/16 1645 11/03/16 0824 11/04/16 1222  WBC 18.1* 18.3* 19.4* 16.2*  NEUTROABS 17.3* 16.5*  --   --   HGB 12.4* 11.9* 12.0* 11.2*  HCT 37.5* 35.5* 36.7* 34.3*  MCV 91.5 91.0 92.0 94.2  PLT 154 113* 107* 76*   Basic Metabolic Panel:  Recent Labs Lab 11/02/16 1226 11/03/16 0824 11/04/16 1222  NA 135 136 137  K 3.6 3.9 3.9  CL 90* 94* 99*  CO2 25 25 25   GLUCOSE 168* 164* 65  BUN 29* 45* 43*  CREATININE 5.35* 6.39* 5.08*  CALCIUM 8.6* 8.5* 8.1*   GFR: Estimated Creatinine Clearance: 14 mL/min (A) (by C-G formula based on SCr of 5.08 mg/dL (H)). Liver Function Tests:  Recent Labs Lab 11/02/16 1226 11/03/16 0824 11/04/16 1222  AST 43* 43* 116*  ALT 42 37 64*  ALKPHOS 86 76 147*  BILITOT 1.1 1.2 1.2  PROT 6.5 5.7* 5.6*  ALBUMIN 2.4* 2.0* 1.7*   No results for input(s): LIPASE, AMYLASE in the last 168 hours. No results for input(s): AMMONIA in the last 168 hours. Coagulation Profile:  Recent Labs Lab 11/02/16 1645 11/03/16 0824 11/03/16 1200 11/04/16 0555 11/05/16 0315  INR 2.84 4.00 4.49* 1.89 1.81   Cardiac Enzymes: No results for input(s): CKTOTAL, CKMB, CKMBINDEX, TROPONINI in the last 168 hours. BNP (last 3 results) No results for input(s): PROBNP in the last 8760 hours. HbA1C: No results for input(s): HGBA1C in the last 72 hours. CBG:  Recent Labs Lab 11/04/16 2214 11/04/16 2325 11/05/16 0205 11/05/16 0648 11/05/16 0753  GLUCAP 47* 82 68 52* 124*   Lipid Profile:  Recent Labs  11/03/16 0824  CHOL 77  HDL 25*  LDLCALC 35  TRIG 84  CHOLHDL 3.1   Thyroid Function Tests: No results for input(s): TSH, T4TOTAL, FREET4, T3FREE, THYROIDAB in the last 72  hours. Anemia Panel: No results for input(s): VITAMINB12, FOLATE, FERRITIN, TIBC, IRON, RETICCTPCT in the last 72 hours. Urine analysis:    Component Value Date/Time   COLORURINE YELLOW 09/11/2016 2325   APPEARANCEUR CLOUDY (A) 09/11/2016 2325   LABSPEC 1.014 09/11/2016 2325   PHURINE 8.0 09/11/2016 2325   GLUCOSEU >=500 (A) 09/11/2016 2325   HGBUR MODERATE (A) 09/11/2016 2325   BILIRUBINUR NEGATIVE 09/11/2016 2325   KETONESUR NEGATIVE 09/11/2016 2325   PROTEINUR >=300 (A) 09/11/2016 2325   UROBILINOGEN 0.2 03/21/2015 1830   NITRITE NEGATIVE 09/11/2016 2325   LEUKOCYTESUR MODERATE (A) 09/11/2016 2325   Sepsis Labs: @LABRCNTIP (procalcitonin:4,lacticidven:4)  ) Recent Results (from the past 240 hour(s))  Blood Culture (routine x 2)     Status: None (Preliminary result)   Collection Time: 11/02/16 12:31 PM  Result Value Ref Range Status   Specimen Description BLOOD RIGHT ANTECUBITAL  Final   Special Requests BOTTLES DRAWN AEROBIC ONLY 10CC  Final   Culture NO GROWTH 2 DAYS  Final   Report Status PENDING  Incomplete  Blood Culture (routine x 2)     Status: None (Preliminary result)   Collection Time: 11/02/16 12:38 PM  Result Value Ref Range Status   Specimen Description BLOOD RIGHT FOREARM  Final   Special Requests IN PEDIATRIC BOTTLE 4CC  Final   Culture NO GROWTH 2 DAYS  Final   Report Status PENDING  Incomplete      Radiology Studies: No results found.   Scheduled Meds: . aspirin EC  81 mg Oral Daily  . calcitRIOL  0.75 mcg Oral Q M,W,F-HD  . calcium acetate  2,668 mg Oral TID WC  . ceFEPime (MAXIPIME) IV  1 g Intravenous Q24H  . feeding supplement (PRO-STAT SUGAR FREE 64)  30 mL Oral BID  . fluconazole (DIFLUCAN) IV  200 mg Intravenous Q24H  . insulin aspart  0-9 Units Subcutaneous TID WC  . insulin detemir  40 Units Subcutaneous q morning - 10a  . isosorbide mononitrate  30 mg Oral Daily  . metoprolol  tartrate  25 mg Oral BID  . midodrine  10 mg Oral TID WC   . multivitamin  1 tablet Oral QHS  . nystatin cream   Topical BID  . simvastatin  40 mg Oral QHS  . sodium chloride  500 mL Intravenous Once  . sodium chloride flush  3 mL Intravenous Q12H  . vancomycin  750 mg Intravenous Q M,W,F-HD  . Warfarin - Pharmacist Dosing Inpatient   Does not apply q1800   Continuous Infusions: . dextrose 50 mL/hr at 11/05/16 0948     LOS: 3 days   Time Spent in minutes   30 minutes  Veron Senner D.O. on 11/05/2016 at 11:19 AM  Between 7am to 7pm - Pager - [phone]  After 7pm go to [email or web address] - password [email or web address]  And look for the night coverage person covering for me after hours  [facility] Office  [phone]

## 2016-11-05 NOTE — NC FL2 (Signed)
[place] LEVEL OF CARE SCREENING TOOL     IDENTIFICATION  Patient Name: Kenneth [name] Birthdate: 1951/05/08 Sex: male Admission Date (Current Location): 11/02/2016  [facility] and [place] Number:  [employer] and Address:  [facility], [place] [address] [place], [place] [address]      Provider Number: [id number]  Attending Physician Name and Address:  Cristal Ford, DO  Relative Name and Phone Number:       Current Level of Care: Hospital Recommended Level of Care: [place] Prior Approval Number:    Date Approved/Denied:   PASRR Number: [id number]  Discharge Plan: SNF    Current Diagnoses: Patient Active Problem List   Diagnosis Date Noted  . Severe sepsis with septic shock (CODE) ([place]) 11/02/2016  . Infection of penis 11/02/2016  . Cellulitis of left lower leg 11/02/2016  . Cellulitis, penis   . Sepsis ([place])   . Arterial hypotension   . Atrial flutter ([place]) 11/25/2015  . Encounter for therapeutic drug monitoring 11/25/2015  . Adjustment disorder with mixed anxiety and depressed mood 03/31/2015  . Acute encephalopathy 03/30/2015  . Type 2 diabetes mellitus with renal complications 70/26/3785  . ESRD on dialysis ([place])   . TIA (transient ischemic attack) 02/25/2015  . ESRD (end stage renal disease) ([place])   . Encounter for surgical aftercare following surgery of circulatory system 01/09/2015  . End stage renal disease ([place]) 10/16/2013  . Chronic kidney disease, stage IV (severe) ([place]) 08/28/2013  . Diabetes mellitus with kidney disease ([place]) 12/25/2007  . Hyperlipidemia 07/14/2006  . MORBID OBESITY 07/14/2006  . CARPAL TUNNEL SYNDROME 07/14/2006  . Hypertension 07/14/2006  . RENAL DISEASE, CHRONIC, STAGE III 07/14/2006  . NEPHROLITHIASIS 07/14/2006  . 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 DEGENERATION, DISC NOS 07/14/2006  . PROTEINURIA 07/14/2006    Orientation RESPIRATION BLADDER Height & Weight         Normal Incontinent Weight: 175 lb (79.4 kg) Height:  5\' 8"  (172.7 cm)  BEHAVIORAL SYMPTOMS/MOOD NEUROLOGICAL BOWEL NUTRITION STATUS      Continent Diet (see DC summary)  AMBULATORY STATUS COMMUNICATION OF NEEDS Skin   Extensive Assist Verbally Normal                       Personal Care Assistance Level of Assistance  Bathing, Dressing, Feeding Bathing Assistance: Maximum assistance Feeding assistance: Limited assistance Dressing Assistance: Maximum assistance     Functional Limitations Info             SPECIAL CARE FACTORS FREQUENCY  PT (By licensed PT), OT (By licensed OT)     PT Frequency: 5/wk OT Frequency: 5/wk            Contractures      Additional Factors Info  Code Status, Allergies, Isolation Precautions Code Status Info: DNR Allergies Info: Penicillins     Isolation Precautions Info: NA     Current Medications (11/05/2016):  This is the current hospital active medication list Current Facility-Administered Medications  Medication Dose Route Frequency Provider Last Rate Last Dose  . 0.9 %  sodium chloride infusion  100 mL Intravenous PRN Thomos Lemons Ejigiri, PA-C      . 0.9 %  sodium chloride infusion  100 mL Intravenous PRN Lynnda Child, PA-C      . acetaminophen (TYLENOL) tablet 1,000 mg  1,000 mg Oral Q6H PRN Brenton Grills, PA-C      . alteplase (CATHFLO ACTIVASE) injection  2 mg  2 mg Intracatheter Once PRN Lynnda Child, PA-C      . aspirin EC tablet 81 mg  81 mg Oral Daily Brenton Grills, PA-C   81 mg at 11/04/16 1036  . calcitRIOL (ROCALTROL) capsule 0.75 mcg  0.75 mcg Oral Q M,W,F-HD Lynnda Child, PA-C   0.75 mcg at 11/03/16 1306  . calcium acetate (PHOSLO) capsule 2,668 mg  2,668 mg Oral TID WC Brenton Grills, PA-C   2,668 mg at 11/04/16 1749  . calcium acetate (PHOSLO) capsule 248-2,500 mg  370-4,888 mg Oral TID PRN Brenton Grills, PA-C      . ceFEPIme (MAXIPIME) 1 g in dextrose 5 % 50 mL IVPB   1 g Intravenous Q24H Alfonzo Beers, MD   1 g at 11/04/16 1609  . dextrose 10 % infusion   Intravenous Continuous Alric Seton, PA-C 75 mL/hr at 11/05/16 0358    . feeding supplement (PRO-STAT SUGAR FREE 64) liquid 30 mL  30 mL Oral BID Thomos Lemons Ejigiri, PA-C   30 mL at 11/04/16 1000  . fluconazole (DIFLUCAN) IVPB 200 mg  200 mg Intravenous Q24H Rolla Flatten, RPH   200 mg at 11/04/16 2223  . insulin aspart (novoLOG) injection 0-9 Units  0-9 Units Subcutaneous TID WC Brenton Grills, PA-C   1 Units at 11/03/16 1730  . insulin detemir (LEVEMIR) injection 40 Units  40 Units Subcutaneous q morning - 10a Reyne Dumas, MD      . isosorbide mononitrate (IMDUR) 24 hr tablet 30 mg  30 mg Oral Daily Brenton Grills, PA-C      . lidocaine (PF) (XYLOCAINE) 1 % injection 5 mL  5 mL Intradermal PRN Lynnda Child, PA-C      . lidocaine-prilocaine (EMLA) cream 1 application  1 application Topical PRN Lynnda Child, PA-C      . metoprolol tartrate (LOPRESSOR) tablet 25 mg  25 mg Oral BID Brenton Grills, PA-C   25 mg at 11/04/16 1036  . midodrine (PROAMATINE) tablet 10 mg  10 mg Oral TID WC Roney Jaffe, MD   10 mg at 11/04/16 1609  . multivitamin (RENA-VIT) tablet 1 tablet  1 tablet Oral QHS Brenton Grills, PA-C   1 tablet at 11/03/16 2153  . nystatin cream (MYCOSTATIN)   Topical BID Ardis Hughs, MD      . ondansetron [facility]) tablet 4 mg  4 mg Oral Q6H PRN Brenton Grills, PA-C       Or  . ondansetron [facility]) injection 4 mg  4 mg Intravenous Q6H PRN Brenton Grills, PA-C      . oxyCODONE (Oxy IR/ROXICODONE) immediate release tablet 5 mg  5 mg Oral Q6H PRN Brenton Grills, PA-C   5 mg at 11/03/16 1729  . pentafluoroprop-tetrafluoroeth (GEBAUERS) aerosol 1 application  1 application Topical PRN Thomos Lemons Ejigiri, PA-C      . polyethylene glycol (MIRALAX / GLYCOLAX) packet 17 g  17 g Oral Daily PRN Brenton Grills, PA-C      . simvastatin (ZOCOR)  tablet 40 mg  40 mg Oral QHS Brenton Grills, PA-C   40 mg at 11/03/16 2155  . sodium chloride 0.9 % bolus 500 mL  500 mL Intravenous Once Brenton Grills, PA-C   Stopped at 11/02/16 1641  . sodium chloride flush (NS) 0.9 % injection 3 mL  3 mL Intravenous Q12H Brenton Grills, PA-C   3 mL at 11/04/16  2228  . vancomycin (VANCOCIN) IVPB 750 mg/150 ml premix  750 mg Intravenous Q M,W,F-HD Rolla Flatten, [facility]      . Warfarin - Pharmacist Dosing Inpatient   Does not apply [place], [place] at 11/03/16 1800     Discharge Medications: Please see discharge summary for a list of discharge medications.  Relevant Imaging Results:  Relevant Lab Results:   Additional Information SS#: [id number], dialysis MWF at [facility], [place], [place]

## 2016-11-05 NOTE — Progress Notes (Signed)
[facility] Progress Note   Dialysis Orders: [place] MWF 4h 42min  F200   BFR450   2/2.25  P4  94.5kg   Hep 7000  LUE AVF -Calcitriol 0.75 mcg PO q HD -Venofer 50mg  IV q week   Assessment/Plan: 1. Sepsis/penile necrosis- not operative candidate per urology- on Vanc and Rocephin- no + cultures 2. ESRD -MWF K 3.9 3/22- holding on HD for now - for palliative care consult;  3. Anemia - 11 - no ESA 4. Secondary hyperparathyroidism - current meds 5. Hypotension/volume - Excess volume- BS labile; last HD was 3/12 with a net UF of 800- BP limiting UF 6. Nutrition - very low alb - contributing to edema 7. Hypoglycemia last pt /DM - on D10 at 75 - BS up this am - reduce to 50 to limit fluid burden - if BS drops can increase- wt last pm is an error 8. Left leg cellulitis - weeping - empiric abtx/local care 9. Disp - for palliative care meeting- Dr. Jonnie Finner discussed with daughter yesterday - dialysis will not improve the QOL for this gentleman and low BP will limit fluid removal  Myriam Jacobson, PA-C [place] [phone] 11/05/2016,8:47 AM  LOS: 3 days   Pt seen, examined and agree w A/P as above. BP's dropping and pt becoming more lethargic and somnolent; suspect he is dying.  No family here at the moment.  Pall care consult pending.  No plans for further HD.   Kelly Splinter MD [facility] pager [phone]   11/05/2016, 12:09 PM    Subjective:   Denies pain  Objective Vitals:   11/04/16 1024 11/04/16 1708 11/04/16 2217 11/05/16 0418  BP: (!) 93/59 114/63 93/65 (!) 65/51  Pulse: (!) 120 (!) 107 (!) 110 (!) 106  Resp: 20 18 14 16   Temp: 98.2 F (36.8 C) 98 F (36.7 C) 97.6 F (36.4 C) 98.6 F (37 C)  TempSrc: Oral Axillary    SpO2: 97% 93% 93% 96%  Weight:      Height:       Physical Exam General: NAD supine alert Heart: tachy ~110 Lungs: grossly clear Abdomen: soft GU - not examined Extremities: bilateral areas of  purplish erythema, ++edema LLE wrapped - weeping through dressing Dialysis Access: left upper AVF + bruit Neuro: responds to some questions by no or shaking head   Additional Objective Labs: Basic Metabolic Panel:  Recent Labs Lab 11/02/16 1226 11/03/16 0824 11/04/16 1222  NA 135 136 137  K 3.6 3.9 3.9  CL 90* 94* 99*  CO2 25 25 25   GLUCOSE 168* 164* 65  BUN 29* 45* 43*  CREATININE 5.35* 6.39* 5.08*  CALCIUM 8.6* 8.5* 8.1*   Liver Function Tests:  Recent Labs Lab 11/02/16 1226 11/03/16 0824 11/04/16 1222  AST 43* 43* 116*  ALT 42 37 64*  ALKPHOS 86 76 147*  BILITOT 1.1 1.2 1.2  PROT 6.5 5.7* 5.6*  ALBUMIN 2.4* 2.0* 1.7*   CBC:  Recent Labs Lab 11/02/16 1226 11/02/16 1645 11/03/16 0824 11/04/16 1222  WBC 18.1* 18.3* 19.4* 16.2*  NEUTROABS 17.3* 16.5*  --   --   HGB 12.4* 11.9* 12.0* 11.2*  HCT 37.5* 35.5* 36.7* 34.3*  MCV 91.5 91.0 92.0 94.2  PLT 154 113* 107* 76*   Blood Culture    Component Value Date/Time   SDES BLOOD RIGHT FOREARM 11/02/2016 1238   SPECREQUEST IN PEDIATRIC BOTTLE 4CC 11/02/2016 1238   CULT NO GROWTH 2 DAYS 11/02/2016  [place] 11/02/2016 1238    Cardiac Enzymes: No results for input(s): CKTOTAL, CKMB, CKMBINDEX, TROPONINI in the last 168 hours. CBG:  Recent Labs Lab 11/04/16 2214 11/04/16 2325 11/05/16 0205 11/05/16 0648 11/05/16 0753  GLUCAP 47* 82 68 52* 124*    Lab Results  Component Value Date   INR 1.81 11/05/2016   INR 1.89 11/04/2016   INR 4.49 (HH) 11/03/2016   Medications: . dextrose 75 mL/hr at 11/05/16 0358   . aspirin EC  81 mg Oral Daily  . calcitRIOL  0.75 mcg Oral Q M,W,F-HD  . calcium acetate  2,668 mg Oral TID WC  . ceFEPime (MAXIPIME) IV  1 g Intravenous Q24H  . feeding supplement (PRO-STAT SUGAR FREE 64)  30 mL Oral BID  . fluconazole (DIFLUCAN) IV  200 mg Intravenous Q24H  . insulin aspart  0-9 Units Subcutaneous TID WC  . insulin detemir  40 Units Subcutaneous q morning  - 10a  . isosorbide mononitrate  30 mg Oral Daily  . metoprolol tartrate  25 mg Oral BID  . midodrine  10 mg Oral TID WC  . multivitamin  1 tablet Oral QHS  . nystatin cream   Topical BID  . simvastatin  40 mg Oral QHS  . sodium chloride  500 mL Intravenous Once  . sodium chloride flush  3 mL Intravenous Q12H  . vancomycin  500 mg Intravenous Q Thu-HD  . vancomycin  1,000 mg Intravenous Q M,W,F-HD  . Warfarin - Pharmacist Dosing Inpatient   Does not apply [phone]

## 2016-11-05 NOTE — Progress Notes (Signed)
ANTICOAGULATION CONSULT NOTE - Follow Up Consult  Pharmacy Consult for Warfarin Indication: atrial fibrillation  Patient Measurements: Height: 5\' 8"  (172.7 cm) Weight: 175 lb (79.4 kg) IBW/kg (Calculated) : 68.4  Vital Signs: Temp: 98.1 F (36.7 C) (03/23 0917) Temp Source: Oral (03/23 0917) BP: 82/55 (03/23 1000) Pulse Rate: 112 (03/23 0917)  Labs:  Recent Labs  11/02/16 1226 11/02/16 1645 11/03/16 0824 11/03/16 1200 11/04/16 0555 11/04/16 1222 11/05/16 0315  HGB 12.4* 11.9* 12.0*  --   --  11.2*  --   HCT 37.5* 35.5* 36.7*  --   --  34.3*  --   PLT 154 113* 107*  --   --  76*  --   APTT  --  50*  --   --   --   --   --   LABPROT 30.1* 30.4* 40.0* 43.9* 22.0*  --  21.3*  INR 2.80 2.84 4.00 4.49* 1.89  --  1.81  CREATININE 5.35*  --  6.39*  --   --  5.08*  --     Estimated Creatinine Clearance: 14 mL/min (A) (by C-G formula based on SCr of 5.08 mg/dL (H)).   Assessment: 65 YOM on warfarin PTA for hx Aflutter. INR on admit was 2.8 on PTA dose of 5 mg daily EXCEPT for 2.5 mg on Mon/Thurs. INR rose to 4.49 on 3/21 and came back down to 1.89 on 3/22 s/p Vit K 2.5 mg. Warfarin was resumed 3/22 evening.  INR today remains SUBtherapeutic (INR 1.81 << 1.89, goal of 2-3). It is noted that the patient is currently unable to take po meds and/or diet due to lethargy. Platelets have also dropped to 76. This was discussed with Dr. Ree Kida and plans are to hold warfarin this evening pending palliative plans.   Goal of Therapy:  INR 2-3 Monitor platelets by anticoagulation protocol: Yes   Plan:  1. Hold warfarin today 2. Will f/u PT/INR in the AM 3. Will f/u MD plans to resume anticoagulation  Thank you for allowing pharmacy to be a part of this patient's care.  Alycia Rossetti, PharmD, BCPS Clinical Pharmacist Pager: [phone] Clinical phone for 11/05/2016 from 7a-3:30p: [phone] If after 3:30p, please call main pharmacy at: [phone] 11/05/2016 12:55 PM

## 2016-11-05 NOTE — Consult Note (Signed)
Consultation Note Date: 11/05/2016   Patient Name: Kenneth [name]  DOB: 1951/01/27  MRN: [id number]  Age / Sex: 66 y.o., male  PCP: [place] Referring Physician: Cristal Ford, DO  Reason for Consultation: Establishing goals of care, Hospice Evaluation, Inpatient hospice referral, Non pain symptom management, Pain control and Psychosocial/spiritual support  HPI/Patient Profile: 66 y.o. male  with past medical history of Diabetes, end-stage renal disease on hemodialysis 4 years, hyperlipidemia, hypertension, GERD, TIA admitted on 11/02/2016 with hypertensive and increased swelling to lower extremities. Patient has a necrotic lesion to his penis. On 10/14/2016 patient underwent may auto me, circumcision and debridement of glans penis by Dr. Idamae Schuller. 2 weeks later he was seen in emergency room with complaints of penile pain and lower extremity swelling. He's gotten progressively weaker. Upon arrival to the emergency room he was hypotensive, tachycardic, febrile with a temperature of 100.4 leukocytosis was present at 18,000; lactic acid was elevated at 2.99 and patient was started on broad-spectrum antibiotics..   Clinical Assessment and Goals of Care: Patient is minimally responsive even to pain. He has had a sharp decline overnight. I met with his 3 children to discuss goals of care. They share with me that her father has told him that he is tired. They show good understanding given his clinical condition why he is unable to continue with hemodialysis (overwhelming sepsis, hypotension) and/or desiring dignity and comfort at this point.  Patient's surrogate decision makers are his 3 children. He has lived with his mother and then her care giver. All children are in agreement for comfort care and transition to inpatient hospice      SUMMARY OF RECOMMENDATIONS   DNR/DNI Stop  dialysis Residential hospice. Social work order placed. Family hopeful for [facility]. [facility] liaison with hospice and palliative care of [place] to update him of potential admission Continue IV antibiotics until discharge to inpatient hospice We'll discontinue oral medications as he is no longer able To swallow Discontinue Coumadin  Code Status/Advance Care Planning:  DNR    Symptom Management:   Pain: We'll start low-dose hydromorphone 0.5 mg every 4 hours as needed for pain or shortness of breath. Morphine will not be metabolized well in the setting of end-stage renal disease  Dyspnea: Continue oxygen as needed via nasal cannula. Do not advance to facemask or BiPAP  Secretions: We'll add Robinul 0.2 mg every 4 hours as needed for excess secretions if they are formed   Anxiety: Ativan 0.5 mg IV every 6 hours as needed  Nausea: Zofran 4 mg IV every 6 hours as needed  Palliative Prophylaxis:   Aspiration, Bowel Regimen, Delirium Protocol, Eye Care, Frequent Pain Assessment, Oral Care, Palliative Wound Care and Turn Reposition  Additional Recommendations (Limitations, Scope, Preferences):  Avoid Hospitalization, Minimize Medications, No Artificial Feeding, No Blood Transfusions, No Chemotherapy, No Diagnostics, No Hemodialysis, No Lab Draws, No Radiation, No Surgical Procedures and No Tracheostomy  Psycho-social/Spiritual:   Desire for further Chaplaincy support:no  Additional Recommendations: Grief/Bereavement Support  Prognosis:   <  2 weeks in the setting of end-stage renal disease, stopping dialysis; sepsis secondary to necrotic penile lesion  Discharge Planning: Hospice facility      Primary Diagnoses: Present on Admission: . Severe sepsis with septic shock (CODE) ([place]) . Diabetes mellitus with kidney disease ([place]) . ESRD (end stage renal disease) ([place]) . Hypertension . Hyperlipidemia   I have reviewed the medical record, interviewed the patient  and family, and examined the patient. The following aspects are pertinent.  Past Medical History:  Diagnosis Date  . Arthritis   . Blood disease    states that he makes too many red blood cells.  . Carpal tunnel syndrome   . Claustrophobia   . Complication of anesthesia    has claustrophobia with mri  . Diabetes mellitus without complication ([place])    type 2  . ESRD (end stage renal disease) ([place])    on Dialysis M, W, F  . GERD (gastroesophageal reflux disease)    history  . Headache   . Hyperlipidemia   . Hypertension    history of, off medication now  . Renal disorder   . Renal insufficiency   . TIA (transient ischemic attack) 03/2015   Social History   Social History  . Marital status: Divorced    Spouse name: N/A  . Number of children: 3  . Years of education: 52   Occupational History  .      disabled, dialysis   Social History Main Topics  . Smoking status: Never Smoker  . Smokeless tobacco: Never Used  . Alcohol use No     Comment: quit 1977  . Drug use: No  . Sexual activity: Not Asked   Other Topics Concern  . None   Social History Narrative   Lives in apartment with mother   Caffeine use- soft drinks 3 a day   Family History  Problem Relation Age of Onset  . Cancer Father   . Diabetes Father   . Heart disease Father     Heart Disease before age 1  . Heart attack Father   . Glaucoma Mother   . Hypertension Daughter   . Hyperlipidemia Son    Scheduled Meds: . ceFEPime (MAXIPIME) IV  500 mg Intravenous Q24H  . feeding supplement (PRO-STAT SUGAR FREE 64)  30 mL Oral BID  . fluconazole (DIFLUCAN) IV  100 mg Intravenous Q24H  . insulin aspart  0-9 Units Subcutaneous TID WC  . nystatin cream   Topical BID  . sodium chloride  500 mL Intravenous Once  . sodium chloride flush  3 mL Intravenous Q12H  . vancomycin  750 mg Intravenous Q M,W,F-HD   Continuous Infusions: . dextrose 50 mL/hr at 11/05/16 0948   PRN Meds:.sodium chloride, sodium  chloride, acetaminophen, glycopyrrolate, HYDROmorphone (DILAUDID) injection, lidocaine (PF), lidocaine-prilocaine, LORazepam, ondansetron **OR** ondansetron (ZOFRAN) IV, oxyCODONE Medications Prior to Admission:  Prior to Admission medications   Medication Sig Start Date End Date Taking? Authorizing Provider  calcium acetate (PHOSLO) 667 MG capsule Take 1,334-2,668 mg by mouth See admin instructions. Take 2668 mg by mouth 3 times daily with meals. Take 1334 mg by mouth 2 times daily with snacks.   Yes Historical Provider, MD  Insulin Detemir (LEVEMIR FLEXTOUCH) 100 UNIT/ML Pen Inject 30 Units into the skin every morning. Patient taking differently: Inject 30 Units into the skin 2 (two) times daily.  04/02/15  Yes Silver Huguenin Elgergawy, MD  isosorbide mononitrate (IMDUR) 30 MG 24 hr tablet TAKE ONE TABLET BY  MOUTH ONCE DAILY. 06/03/16  Yes Herminio Commons, MD  metoprolol tartrate (LOPRESSOR) 25 MG tablet Take 1 tablet (25 mg total) by mouth 2 (two) times daily. 03/02/16  Yes Herminio Commons, MD  multivitamin (RENA-VIT) TABS tablet Take 1 tablet by mouth daily.   Yes Historical Provider, MD  oxyCODONE (OXY IR/ROXICODONE) 5 MG immediate release tablet Take 1 tablet (5 mg total) by mouth every 6 (six) hours as needed for severe pain. 10/14/16  Yes Irine Seal, MD  simvastatin (ZOCOR) 40 MG tablet Take 40 mg by mouth at bedtime.    Yes Historical Provider, MD  warfarin (COUMADIN) 5 MG tablet TAKE 1 TABLET BY MOUTH ONE TIME ONLY AT 6PM. 06/03/16  Yes Herminio Commons, MD  acetaminophen (TYLENOL) 500 MG tablet Take 1,000 mg by mouth every 6 (six) hours as needed for mild pain, moderate pain or headache.     Historical Provider, MD  aspirin EC 81 MG tablet Take 81 mg by mouth daily.    Historical Provider, MD  cephALEXin (KEFLEX) 500 MG capsule Take 1 capsule (500 mg total) by mouth 2 (two) times daily. Patient not taking: Reported on 10/12/2016 09/12/16   Fransico Meadow, PA-C  enoxaparin (LOVENOX) 100  MG/ML injection Inject 1 mL (100 mg total) into the skin daily. At [place]. Patient not taking: Reported on 11/05/2016 10/10/16 10/28/16  Herminio Commons, MD   Allergies  Allergen Reactions  . Penicillins Other (See Comments)    Reaction:  Unknown  Has patient had a PCN reaction causing immediate rash, facial/tongue/throat swelling, SOB or lightheadedness with hypotension: Unsure Has patient had a PCN reaction causing severe rash involving mucus membranes or skin necrosis: Unsure Has patient had a PCN reaction that required hospitalization Unsure  Has patient had a PCN reaction occurring within the last 10 years: No If all of the above answers are "NO", then may proceed with Cephalosporin use.   Review of Systems  Unable to perform ROS: Patient unresponsive    Physical Exam  Constitutional: He appears well-developed.  Acutely ill appearing older man. Minimally responsive  HENT:  Head: Normocephalic and atraumatic.  Cardiovascular:  irrg  Pulmonary/Chest: Effort normal.  Genitourinary:  Genitourinary Comments: Dressing to groin  Neurological:  Minimally responsive  Psychiatric:  Non verbal  Nursing note and vitals reviewed.   Vital Signs: BP (!) 82/55 (BP Location: Right Arm)   Pulse (!) 112   Temp 98.1 F (36.7 C) (Oral)   Resp 15   Ht 5' 8" (1.727 m)   Wt 79.4 kg (175 lb)   SpO2 94%   BMI 26.61 kg/m  Pain Assessment: No/denies pain   Pain Score: 0-No pain   SpO2: SpO2: 94 % O2 Device:SpO2: 94 % O2 Flow Rate: .   IO: Intake/output summary:  Intake/Output Summary (Last 24 hours) at 11/05/16 1604 Last data filed at 11/05/16 1420  Gross per 24 hour  Intake           994.58 ml  Output                0 ml  Net           994.58 ml    LBM: Last BM Date: 11/01/16 Baseline Weight: Weight: 96.2 kg (212 lb) Most recent weight: Weight: 79.4 kg (175 lb)     Palliative Assessment/Data:   Flowsheet Rows     Most Recent Value  Intake Tab  Referral Department   Hospitalist  Unit at Time  of Referral  Med/Surg Unit  Palliative Care Primary Diagnosis  Nephrology  Date Notified  11/04/16  Palliative Care Type  New Palliative care  Reason for referral  Clarify Goals of Care, Counsel Regarding Hospice  Date of Admission  11/02/16  Date first seen by Palliative Care  11/05/16  # of days Palliative referral response time  1 Day(s)  # of days IP prior to Palliative referral  2  Clinical Assessment  Palliative Performance Scale Score  20%  Pain Max last 24 hours  Not able to report  Pain Min Last 24 hours  Not able to report  Dyspnea Max Last 24 Hours  Not able to report  Dyspnea Min Last 24 hours  Not able to report  Nausea Max Last 24 Hours  Not able to report  Nausea Min Last 24 Hours  Not able to report  Anxiety Max Last 24 Hours  Not able to report  Anxiety Min Last 24 Hours  Not able to report  Other Max Last 24 Hours  Not able to report  Psychosocial & Spiritual Assessment  Palliative Care Outcomes  Patient/Family meeting held?  Yes  Who was at the meeting?  3 children  Palliative Care Outcomes  Improved pain interventions, Improved non-pain symptom therapy, Clarified goals of care, Transitioned to hospice, Changed to focus on comfort, Provided end of life care assistance, Counseled regarding hospice, Provided psychosocial or spiritual support  Patient/Family wishes: Interventions discontinued/not started   Mechanical Ventilation, BiPAP, Hemodialysis, Transfusion, Vasopressors, Trach, NIPPV, Tube feedings/TPN, PEG  Palliative Care follow-up planned  Yes, Facility      Time In: 1500 Time Out: 1615 Time Total: 75 min Greater than 50%  of this time was spent counseling and coordinating care related to the above assessment and plan. Staffed with Dr. Ree Kida  Signed by: Dory Horn, NP   Please contact Palliative Medicine Team phone at [phone] for questions and concerns.  For individual provider: See Shea Evans

## 2016-11-05 NOTE — Progress Notes (Signed)
Pharmacy Antibiotic Note  Kenneth [name] is a 66 y.o. male admitted on 11/02/2016 with concern for sepsis related to penile infection.  Pharmacy has been consulted to dose Fluconazole + Vancomycin + Cefepime.  The patient is noted to have ESRD-MWF however prognosis is poor and given lethargy and somnolence - plans are to withhold further dialysis sessions.   Plan: 1. Hold Vancomycin  2. Reduce Fluconazole to 100 mg IV every 24 hours 3. Reduce Cefepime to 500 mg IV every 24 hours 4. Will continue to follow culture results and LOT plans 5. Will follow-up palliative plans  Height: 5\' 8"  (172.7 cm) Weight: 175 lb (79.4 kg) IBW/kg (Calculated) : 68.4  Temp (24hrs), Avg:98.1 F (36.7 C), Min:97.6 F (36.4 C), Max:98.6 F (37 C)   Recent Labs Lab 11/02/16 1226 11/02/16 1237 11/02/16 1634 11/02/16 1645 11/03/16 0824 11/04/16 1222  WBC 18.1*  --   --  18.3* 19.4* 16.2*  CREATININE 5.35*  --   --   --  6.39* 5.08*  LATICACIDVEN  --  2.99* 1.93*  --   --   --     Estimated Creatinine Clearance: 14 mL/min (A) (by C-G formula based on SCr of 5.08 mg/dL (H)).    Allergies  Allergen Reactions  . Penicillins Other (See Comments)    Reaction:  Unknown  Has patient had a PCN reaction causing immediate rash, facial/tongue/throat swelling, SOB or lightheadedness with hypotension: Unsure Has patient had a PCN reaction causing severe rash involving mucus membranes or skin necrosis: Unsure Has patient had a PCN reaction that required hospitalization Unsure  Has patient had a PCN reaction occurring within the last 10 years: No If all of the above answers are "NO", then may proceed with Cephalosporin use.    Antimicrobials this admission: Cefepime 3/20 >> Vancomycin 3/20 >> Fluconazole 3/21 >>  Microbiology results: 3/20 BCx >> ngtd  Thank you for allowing pharmacy to be a part of this patient's care.  Alycia Rossetti, PharmD, BCPS Clinical Pharmacist Pager: [phone] Clinical  phone for 11/05/2016 from 7a-3:30p: [phone] If after 3:30p, please call main pharmacy at: [phone] 11/05/2016 3:13 PM

## 2016-11-05 NOTE — Progress Notes (Signed)
Palliative Medicine consult noted. Due to high referral volume, there may be a delay seeing this patient. Please call the Palliative Medicine Team office at [phone] if recommendations are needed in the interim.  Thank you for inviting [place] to see this patient.  Marjie Skiff Fiorela Pelzer, RN, BSN, [facility] 11/05/2016 12:53 PM Cell [phone] 8:00-4:00 Monday-Friday Office [phone]

## 2016-11-05 NOTE — Progress Notes (Addendum)
Subjective: Penile ischemic necrosis with sepsis.  He is more awake and alert today and responds to questioning.  Continues to have pain.    ROS:  Review of Systems  Unable to perform ROS: Medical condition    Anti-infectives: Anti-infectives    Start     Dose/Rate Route Frequency Ordered Stop   11/05/16 1200  vancomycin (VANCOCIN) IVPB 1000 mg/200 mL premix     1,000 mg 200 mL/hr over 60 Minutes Intravenous Every M-W-F (Hemodialysis) 11/03/16 1308     11/04/16 2200  fluconazole (DIFLUCAN) IVPB 200 mg     200 mg 100 mL/hr over 60 Minutes Intravenous Every 24 hours 11/03/16 0846     11/04/16 1400  vancomycin (VANCOCIN) 500 mg in sodium chloride 0.9 % 100 mL IVPB     500 mg 100 mL/hr over 60 Minutes Intravenous Every Thu (Hemodialysis) 11/04/16 1306 11/11/16 1159   11/03/16 1800  ceFEPIme (MAXIPIME) 1 g in dextrose 5 % 50 mL IVPB     1 g 100 mL/hr over 30 Minutes Intravenous Every 24 hours 11/02/16 1218     11/03/16 1400  fluconazole (DIFLUCAN) IVPB 200 mg     200 mg 100 mL/hr over 60 Minutes Intravenous  Once 11/03/16 0941 11/03/16 1522   11/03/16 1200  vancomycin (VANCOCIN) IVPB 1000 mg/200 mL premix     1,000 mg 200 mL/hr over 60 Minutes Intravenous Every M-W-F (Hemodialysis) 11/02/16 2205 11/03/16 1406   11/03/16 1000  fluconazole (DIFLUCAN) IVPB 100 mg  Status:  Discontinued     100 mg 50 mL/hr over 60 Minutes Intravenous 2 times daily 11/03/16 0846 11/03/16 0941   11/02/16 1230  ceFEPIme (MAXIPIME) 2 g in dextrose 5 % 50 mL IVPB     2 g 100 mL/hr over 30 Minutes Intravenous  Once 11/02/16 1218 11/02/16 1316   11/02/16 1215  levofloxacin (LEVAQUIN) IVPB 750 mg  Status:  Discontinued     750 mg 100 mL/hr over 90 Minutes Intravenous  Once 11/02/16 1209 11/02/16 1218   11/02/16 1215  aztreonam (AZACTAM) 2 g in dextrose 5 % 50 mL IVPB  Status:  Discontinued     2 g 100 mL/hr over 30 Minutes Intravenous  Once 11/02/16 1209 11/02/16 1218   11/02/16 1215  vancomycin  (VANCOCIN) IVPB 1000 mg/200 mL premix  Status:  Discontinued     1,000 mg 200 mL/hr over 60 Minutes Intravenous  Once 11/02/16 1209 11/02/16 1214   11/02/16 1215  vancomycin (VANCOCIN) 2,000 mg in sodium chloride 0.9 % 500 mL IVPB     2,000 mg 250 mL/hr over 120 Minutes Intravenous  Once 11/02/16 1214 11/02/16 1603      Current Facility-Administered Medications  Medication Dose Route Frequency Provider Last Rate Last Dose  . 0.9 %  sodium chloride infusion  100 mL Intravenous PRN Thomos Lemons Ejigiri, PA-C      . 0.9 %  sodium chloride infusion  100 mL Intravenous PRN Lynnda Child, PA-C      . acetaminophen (TYLENOL) tablet 1,000 mg  1,000 mg Oral Q6H PRN Brenton Grills, PA-C      . alteplase (CATHFLO ACTIVASE) injection 2 mg  2 mg Intracatheter Once PRN Lynnda Child, PA-C      . aspirin EC tablet 81 mg  81 mg Oral Daily Brenton Grills, PA-C   81 mg at 11/04/16 1036  . calcitRIOL (ROCALTROL) capsule 0.75 mcg  0.75 mcg Oral Q M,W,F-HD Thomos Lemons Ejigiri, PA-C   0.75 mcg  at 11/03/16 1306  . calcium acetate (PHOSLO) capsule 2,668 mg  2,668 mg Oral TID WC Brenton Grills, PA-C   2,668 mg at 11/04/16 1749  . calcium acetate (PHOSLO) capsule 195-0,932 mg  671-2,458 mg Oral TID PRN Brenton Grills, PA-C      . ceFEPIme (MAXIPIME) 1 g in dextrose 5 % 50 mL IVPB  1 g Intravenous Q24H Alfonzo Beers, MD   1 g at 11/04/16 1609  . dextrose 10 % infusion   Intravenous Continuous Reyne Dumas, MD 75 mL/hr at 11/05/16 0358    . dextrose 50 % solution           . feeding supplement (PRO-STAT SUGAR FREE 64) liquid 30 mL  30 mL Oral BID Thomos Lemons Ejigiri, PA-C   30 mL at 11/04/16 1000  . fluconazole (DIFLUCAN) IVPB 200 mg  200 mg Intravenous Q24H Rolla Flatten, RPH   200 mg at 11/04/16 2223  . insulin aspart (novoLOG) injection 0-9 Units  0-9 Units Subcutaneous TID WC Brenton Grills, PA-C   1 Units at 11/03/16 1730  . insulin detemir (LEVEMIR) injection 40 Units  40  Units Subcutaneous q morning - 10a Reyne Dumas, MD      . isosorbide mononitrate (IMDUR) 24 hr tablet 30 mg  30 mg Oral Daily Brenton Grills, PA-C      . lidocaine (PF) (XYLOCAINE) 1 % injection 5 mL  5 mL Intradermal PRN Lynnda Child, PA-C      . lidocaine-prilocaine (EMLA) cream 1 application  1 application Topical PRN Lynnda Child, PA-C      . metoprolol tartrate (LOPRESSOR) tablet 25 mg  25 mg Oral BID Brenton Grills, PA-C   25 mg at 11/04/16 1036  . midodrine (PROAMATINE) tablet 10 mg  10 mg Oral TID WC Roney Jaffe, MD   10 mg at 11/04/16 1609  . multivitamin (RENA-VIT) tablet 1 tablet  1 tablet Oral QHS Brenton Grills, PA-C   1 tablet at 11/03/16 2153  . nystatin cream (MYCOSTATIN)   Topical BID Ardis Hughs, MD      . ondansetron [facility]) tablet 4 mg  4 mg Oral Q6H PRN Brenton Grills, PA-C       Or  . ondansetron [facility]) injection 4 mg  4 mg Intravenous Q6H PRN Brenton Grills, PA-C      . oxyCODONE (Oxy IR/ROXICODONE) immediate release tablet 5 mg  5 mg Oral Q6H PRN Brenton Grills, PA-C   5 mg at 11/03/16 1729  . pentafluoroprop-tetrafluoroeth (GEBAUERS) aerosol 1 application  1 application Topical PRN Thomos Lemons Ejigiri, PA-C      . polyethylene glycol (MIRALAX / GLYCOLAX) packet 17 g  17 g Oral Daily PRN Brenton Grills, PA-C      . simvastatin (ZOCOR) tablet 40 mg  40 mg Oral QHS Brenton Grills, PA-C   40 mg at 11/03/16 2155  . sodium chloride 0.9 % bolus 500 mL  500 mL Intravenous Once Brenton Grills, PA-C   Stopped at 11/02/16 1641  . sodium chloride flush (NS) 0.9 % injection 3 mL  3 mL Intravenous Q12H Brenton Grills, PA-C   3 mL at 11/04/16 2228  . vancomycin (VANCOCIN) 500 mg in sodium chloride 0.9 % 100 mL IVPB  500 mg Intravenous Q Thu-HD Rolla Flatten, [facility]      . vancomycin (VANCOCIN) IVPB 1000 mg/200 mL premix  1,000 mg Intravenous Q M,W,F-HD Benjiman Core  Hassell Done, [facility]      . Warfarin - Pharmacist Dosing  Inpatient   Does not apply q1800 Delane Ginger, [facility]   Stopped at 11/03/16 1800     Objective: Vital signs in last 24 hours: Temp:  [97.6 F (36.4 C)-98.6 F (37 C)] 98.6 F (37 C) (03/23 0418) Pulse Rate:  [106-120] 106 (03/23 0418) Resp:  [14-20] 16 (03/23 0418) BP: (65-114)/(51-65) 65/51 (03/23 0418) SpO2:  [93 %-97 %] 96 % (03/23 0418)  Intake/Output from previous day: 03/22 0701 - 03/23 0700 In: 1244.6 [P.O.:120; I.V.:1074.6; IV Piggyback:50] Out: 0  Intake/Output this shift: No intake/output data recorded.   Physical Exam  Constitutional:  Ill appearing WD WM, resting comfortably.  Genitourinary:  Genitourinary Comments: There is less erythema and induration of the penoscrotal area.   There is stable necrosis of the glans but no new eschar to remove.   Dressing changed.     Lab Results:   Recent Labs  11/03/16 0824 11/04/16 1222  WBC 19.4* 16.2*  HGB 12.0* 11.2*  HCT 36.7* 34.3*  PLT 107* 76*   BMET  Recent Labs  11/03/16 0824 11/04/16 1222  NA 136 137  K 3.9 3.9  CL 94* 99*  CO2 25 25  GLUCOSE 164* 65  BUN 45* 43*  CREATININE 6.39* 5.08*  CALCIUM 8.5* 8.1*   PT/INR  Recent Labs  11/04/16 0555 11/05/16 0315  LABPROT 22.0* 21.3*  INR 1.89 1.81   ABG No results for input(s): PHART, HCO3 in the last 72 hours.  Invalid input(s): PCO2, PO2  Studies/Results: No results found.   Assessment and Plan: Penile ischemic necrosis with sepsis.   I changed the dressing today and attempted debridement but there was no loose eschar.   Continue wound care.    12:51p   I spoke to Mr. Shannahan daughter and reviewed the clinical findings and explained that his prognosis is poor and that additional surgical intervention is not likely to alter the outcome at this time and that continued supportive care is most appropriate.   She voiced understanding and is to meet with palliative care today.    LOS: 3 days    Malka So 11/05/2016 [phone] ID: Ollen Barges, male   DOB: [birth year], 66 y.o.   MRN: [id number]

## 2016-11-06 DIAGNOSIS — Z515 Encounter for palliative care: Secondary | ICD-10-CM

## 2016-11-06 LAB — GLUCOSE, CAPILLARY
Glucose-Capillary: 164 mg/dL — ABNORMAL HIGH (ref 65–99)
Glucose-Capillary: 175 mg/dL — ABNORMAL HIGH (ref 65–99)

## 2016-11-06 NOTE — Discharge Summary (Addendum)
Physician Discharge Summary  [name] QAS:[id number] DOB: 03/10/1951 DOA: 11/02/2016  PCP: [place] date: 11/02/2016 Discharge date: 11/06/2016  Time spent: 45 minutes  Recommendations for Outpatient Follow-up:  Patient will be discharged to [facility].   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 male with medical history significant of ESRD - on HD Tue/thur/Sat. DM type II, HLD, HTN, GERD, TIA who was at the HD center today to receive his usual HD session when he was noticed to have increased swelling of the LE and weakness. His BP Was dropping and patient was referred to [facility] ED for evaluation and possible admission He has been suffering from bilateral lower extremities swelling, L>R and painful penile necrotic lesion. On 10/14/2016 patient underwent meatotomy, circumcision and debridement of  glans penis by Dr. Jeffie Pollock. However, two weeks later he was seen in the ED with c/o penile pain and LE swelling. Patient returned today with c/o persistent penile pain and bilateral LE swelling with progressive weakness  Hospital Course:  Severe sepsis with septic shock -most likely d/t penile infection/left leg cellulitis -S/p IVF, but continues to have tachycardia and hypotension.  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 Penile necrosis is seen primarily in individuals with DM and ESRD and is associated with a poor prognosis per urology  -needs aggressive wound care -Continue treatment as above -Pelvic CT: no evidence of fluid collection or abscess, ?benign renal AML. Consider f/up CT abd in 3-4weeks without contrast  Left Leg cellulitis -Continue IV antibiotics and ask wound care assess with dressing  ESRD -HD on MWF -Nephrology consulted and appreciated -Given hypotension, necrotic GU infection, severity of patient's illness, he currently is not a good surgical candidate and undergoing dialysis under these conditions will not be effective and can make situation worse.  -Palliative care consulted  Atrial flutter, chronic  -Will stop coumadin as patient has been transitioned to comfort care -INR currently 1.81 -Of note, patient was given Vit K for supratherapeutic INR   Diabetes mellitus, type II complicated by episodes of hypoglycemia -HgbA1C is 7.1% on 10/12/2016 -Patient has had episodes of hypoglycemia  Essential Hypertension  -currently hypotensive  Hyperlipidemia   Thrombocytopenia -platelets dropped to 76 on 11/04/2016  Goals of care -Palliative care consulted and appreciated. -Patient currently DNR -patient does not appear to be a candidate for surgery and has not been receiving hemodialysis given his current vitals and infection -Patient transitioned to comfort care and will be discharged to [facility].  Consultants Nephrology Palliative care Urology   Procedures  Echocardiogram I&D at bedside by Urology  Discharge Exam: Vitals:   11/06/16 0423 11/06/16 0904  BP: (!) 80/55 (!) 95/58  Pulse: (!) 115 (!) 112  Resp: 18 20  Temp: 97.7 F (36.5 C) 99.3 F (37.4 C)  Exam  General: Well developed, chronically ill appearing  HEENT: NCAT,  mucous membranes moist.   Cardiovascular: S1 S2 auscultated,  tachycardic  Respiratory: Diminished breath sounds anteriorly  Abdomen: Soft, nontender, nondistended, + bowel sounds  GU: deferred  Extremities: warm dry without cyanosis clubbing. 3+ pitting edema, LLE wrapped  Neuro: unable to assess  Discharge Instructions  Current Discharge Medication List    CONTINUE these medications which have NOT CHANGED   Details  acetaminophen (TYLENOL) 500 MG tablet Take 1,000 mg by mouth every 6 (six) hours as needed for mild pain, moderate pain or headache.       STOP taking these medications     calcium acetate (PHOSLO) 667 MG capsule      Insulin Detemir (LEVEMIR FLEXTOUCH) 100 UNIT/ML Pen      isosorbide mononitrate (IMDUR) 30 MG 24 hr tablet      metoprolol tartrate (LOPRESSOR) 25 MG tablet      multivitamin (RENA-VIT) TABS tablet      oxyCODONE (OXY IR/ROXICODONE) 5 MG immediate release tablet      simvastatin (ZOCOR) 40 MG tablet      warfarin (COUMADIN) 5 MG tablet      aspirin EC 81 MG tablet      cephALEXin (KEFLEX) 500 MG capsule      enoxaparin (LOVENOX) 100 MG/ML injection        Allergies  Allergen Reactions  . Penicillins Other (See Comments)    Reaction:  Unknown  Has patient had a PCN reaction causing immediate rash, facial/tongue/throat swelling, SOB or lightheadedness with hypotension: Unsure Has patient had a PCN reaction causing severe rash involving mucus membranes or skin necrosis: Unsure Has patient had a PCN reaction that required hospitalization Unsure  Has patient had a PCN reaction occurring within the last 10 years: No If all of the above answers are "NO", then may proceed with Cephalosporin use.      The results of significant diagnostics from this hospitalization (including imaging, microbiology, ancillary and laboratory) are listed below for reference.    Significant Diagnostic Studies: Ct Head Wo Contrast  Result Date: 10/28/2016 CLINICAL DATA:  Confusion and right leg weakness. EXAM: CT  HEAD WITHOUT CONTRAST TECHNIQUE: Contiguous axial images were obtained from the base of the skull through the vertex without intravenous contrast. COMPARISON:  03/30/2015 brain MR and 03/21/2015 head CT FINDINGS: Brain: No evidence of acute infarction, hemorrhage, hydrocephalus, extra-axial collection or mass lesion/mass effect. Chronic small-vessel white matter ischemic changes are again noted. Vascular: Intracranial atherosclerotic vascular calcifications noted. Skull: Normal. Negative for fracture or focal lesion. Sinuses/Orbits: No acute finding. Other: None. IMPRESSION: No evidence of acute intracranial abnormality. Mild chronic small-vessel white matter ischemic changes. Electronically Signed   By: Margarette Canada M.D.   On: 10/28/2016 18:06   Ct Pelvis W Contrast  Result Date: 11/02/2016 CLINICAL DATA:  Penile debridement 2 weeks ago, redness/swelling/itching x2 weeks EXAM: CT PELVIS WITH CONTRAST TECHNIQUE: Multidetector CT imaging of the pelvis was performed using the standard protocol following the bolus administration of intravenous contrast. CONTRAST:  75 mL Isovue 300 IV COMPARISON:  None. FINDINGS: Urinary Tract:  Bladder is within normal limits. 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 Bowel:  Mild sigmoid diverticulosis. Visualized bowel is otherwise unremarkable. Vascular/Lymphatic: Mild atherosclerotic calcifications. 2.0 cm short axis inferior left inguinal node (series 301/image 59). Additional small bilateral inguinal  nodes are favored to be reactive. Reproductive:  Prostate is grossly unremarkable. Penis is grossly unremarkable on CT, noting postsurgical changes related to recent circumcision. Other:  Trace pelvic ascites. Mild body wall edema. Musculoskeletal: Mild degenerative changes of the lower lumbar spine. IMPRESSION: Postsurgical  changes related to recent penile circumcision. No evidence of fluid collection/abscess. Dominant 2 cm short axis left inferior inguinal node, presumably inflammatory/reactive. Consider clinical follow-up to ensure resolution. **An incidental finding of potential clinical significance has been found. 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 Consider follow-up CT abdomen without contrast in 3-4 weeks for confirmation. If patient is dialysis dependent and able to receive intravenous contrast, consider CT abdomen with/without contrast.** Electronically Signed   By: Julian Hy M.D.   On: 11/02/2016 15:32   Dg Chest Port 1 View  Result Date: 11/02/2016 CLINICAL DATA:  Sepsis EXAM: PORTABLE CHEST 1 VIEW COMPARISON:  10/28/2016 FINDINGS: AP portable semi-erect view chest demonstrates non inclusion of the right CP angle. There is no focal infiltrate or effusion. There is stable cardiomegaly. Oval opacity in the left upper lobe could reflect rib end. IMPRESSION: Cardiomegaly with mild central congestion.  No acute infiltrates. Electronically Signed   By: Donavan Foil M.D.   On: 11/02/2016 15:56   Dg Chest Portable 1 View  Result Date: 10/28/2016 CLINICAL DATA:  Initial valuation for acute confusion, question CHF. EXAM: PORTABLE CHEST 1 VIEW COMPARISON:  Prior radiograph from 09/04/2015. FINDINGS: Mild cardiomegaly.  Mediastinal silhouette within normal limits. Lungs hypoinflated. Mild diffuse pulmonary vascular congestion without overt pulmonary edema. No focal infiltrates. No pleural effusion. No pneumothorax. No acute osseous abnormality. IMPRESSION: 1. Mild cardiomegaly with diffuse pulmonary vascular congestion without overt pulmonary edema. 2. No other active cardiopulmonary disease. Electronically Signed   By: Jeannine Boga M.D.   On: 10/28/2016 18:20    Microbiology: Recent Results (from the past 240 hour(s))  Blood  Culture (routine x 2)     Status: None (Preliminary result)   Collection Time: 11/02/16 12:31 PM  Result Value Ref Range Status   Specimen Description BLOOD RIGHT ANTECUBITAL  Final   Special Requests BOTTLES DRAWN AEROBIC ONLY 10CC  Final   Culture NO GROWTH 3 DAYS  Final   Report Status PENDING  Incomplete  Blood Culture (routine x 2)     Status: None (Preliminary result)   Collection Time: 11/02/16 12:38 PM  Result Value Ref Range Status   Specimen Description BLOOD RIGHT FOREARM  Final   Special Requests IN PEDIATRIC BOTTLE 4CC  Final   Culture NO GROWTH 3 DAYS  Final   Report Status PENDING  Incomplete     Labs: Basic Metabolic Panel:  Recent Labs Lab 11/02/16 1226 11/03/16 0824 11/04/16 1222  NA 135 136 137  K 3.6 3.9 3.9  CL 90* 94* 99*  CO2 25 25 25   GLUCOSE 168* 164* 65  BUN 29* 45* 43*  CREATININE 5.35* 6.39* 5.08*  CALCIUM 8.6* 8.5* 8.1*   Liver Function Tests:  Recent Labs Lab 11/02/16 1226 11/03/16 0824 11/04/16 1222  AST 43* 43* 116*  ALT 42 37 64*  ALKPHOS 86 76 147*  BILITOT 1.1 1.2 1.2  PROT 6.5 5.7* 5.6*  ALBUMIN 2.4* 2.0* 1.7*   No results for input(s): LIPASE, AMYLASE in the last 168 hours. No results for input(s): AMMONIA in the last 168 hours. CBC:  Recent Labs Lab 11/02/16 1226 11/02/16 1645 11/03/16 0824 11/04/16 1222  WBC 18.1*  18.3* 19.4* 16.2*  NEUTROABS 17.3* 16.5*  --   --   HGB 12.4* 11.9* 12.0* 11.2*  HCT 37.5* 35.5* 36.7* 34.3*  MCV 91.5 91.0 92.0 94.2  PLT 154 113* 107* 76*   Cardiac Enzymes: No results for input(s): CKTOTAL, CKMB, CKMBINDEX, TROPONINI in the last 168 hours. BNP: BNP (last 3 results) No results for input(s): BNP in the last 8760 hours.  ProBNP (last 3 results) No results for input(s): PROBNP in the last 8760 hours.  CBG:  Recent Labs Lab 11/05/16 0753 11/05/16 1118 11/05/16 1613 11/05/16 2126 11/06/16 0726  GLUCAP 124* 114* 118* 122* 164*       Signed:  Cristal Ford  [facility] 11/06/2016, 10:38 AM

## 2016-11-06 NOTE — Progress Notes (Signed)
Report called to Sylvia at [facility]. All questions answered.  

## 2016-11-06 NOTE — Progress Notes (Signed)
[facility] Hospital Liasion:  RN   Received request from [name], [name] for family interest in [place] with request for transfer today.  Chart reviewed.  Met with patient and family to confirm interest and explain services.  Family agreeable to transfer today.  [place], [place], aware.  Registration paperwork will be completed at 1100a today with Jinny Sanders, daughter.  Dr. Orpah Melter to assume care per family request.  Please fax discharge summary to [phone].  RN, please call report to [phone].  Please arrange transport for patient to arrive as soon as possible.    Thank you for the referral.  Edyth Gunnels, RN, [facility] Liaison [phone]   All hospital liaison's are now on [place].

## 2016-11-06 NOTE — Clinical Social Work Note (Signed)
Clinical Social Worker facilitated patient discharge including contacting patient family and facility to confirm patient discharge plans.  Clinical information faxed to facility and family agreeable with plan.  CSW arranged ambulance transport via [facility] to [facility]. RN to call report prior to discharge.  Clinical Social Worker will sign off for now as social work intervention is no longer needed. Please consult [place] again if new need arises.  Keir Viernes B. Joline Maxcy Clinical Social Work Dept Weekend Social Worker [phone] 11:15 AM

## 2016-11-06 NOTE — Progress Notes (Signed)
Pt is now full comfort care.  Will sign off.   Kelly Splinter MD [employer] pgr [phone]   11/06/2016, 9:01 AM

## 2016-11-07 LAB — CULTURE, BLOOD (ROUTINE X 2)
Culture: NO GROWTH
Culture: NO GROWTH

## 2016-11-09 NOTE — Progress Notes (Signed)
  Progress Note   Date: 11/09/2016  Patient Name: Kenneth [name]        MRN#: [id number]  Clarification of the wound debridement completed on 11/02/16:  Non-excisional debridement Debridement depth: overlying the skin Petra Kuba of tissue removed: removal of escar     Ardis Hughs, MD

## 2016-11-14 DEATH — deceased

## 2017-03-30 IMAGING — CT CT HEAD W/O CM
1 series · 16 of 30 positions shown, 20 images · non-contrast
Comparison: None.

CLINICAL DATA: 63-year-old male with peripheral confusion and
slurred speech

EXAM:
CT HEAD WITHOUT CONTRAST
TECHNIQUE: Contiguous axial images were obtained from the base of the skull
through the vertex without intravenous contrast.

[Series 2: headseq 4.8 h37s · axial · 0.43mm/px · z∈[+1078,+1243]mm · 16 of 36 slices shown, 20 images]
[im 2/36  brain]
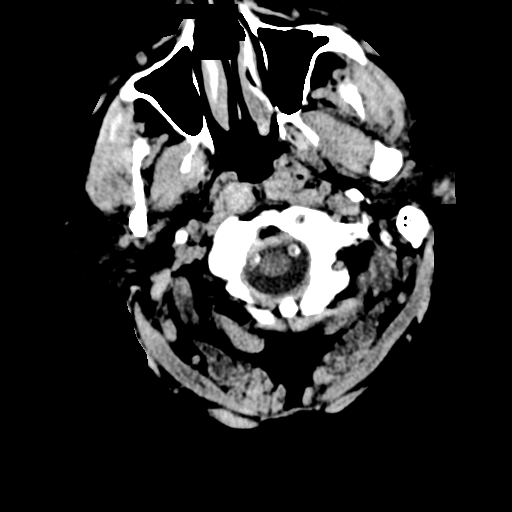
[im 2/36  bone]
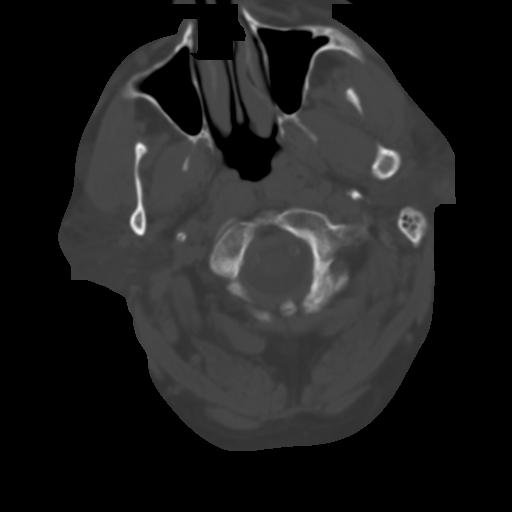
[im 4/36  brain]
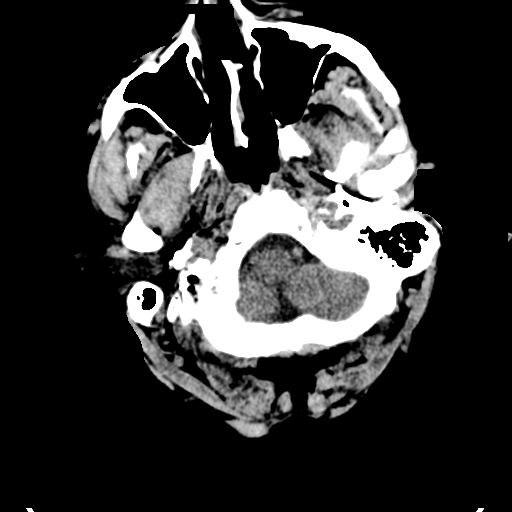
[im 7/36  brain]
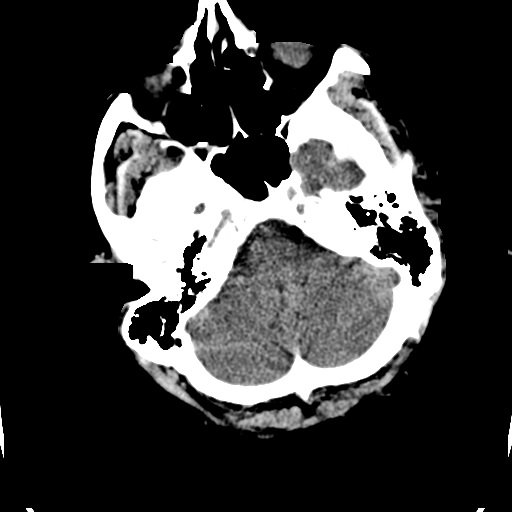
[im 9/36  brain]
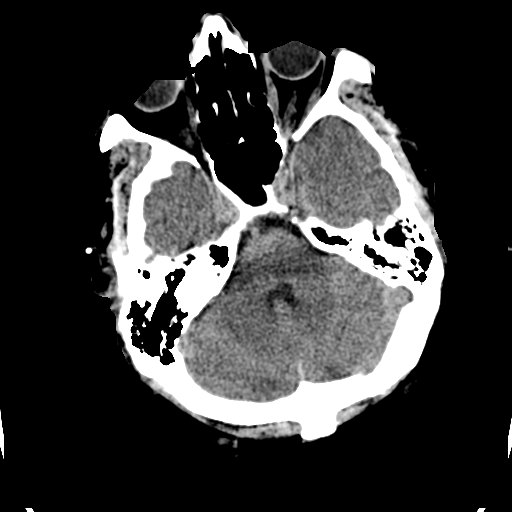
[im 10/36  brain]
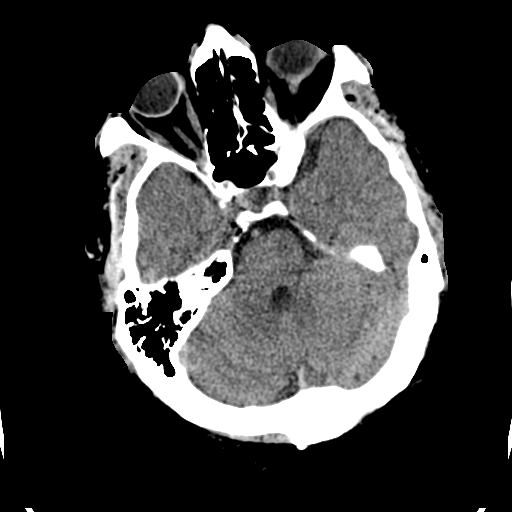
[im 10/36  bone]
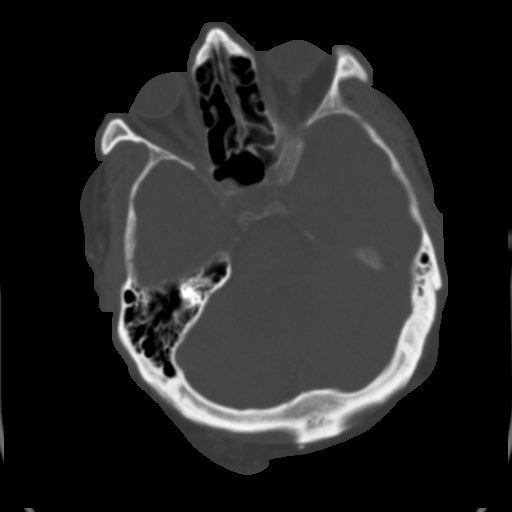
[im 13/36  brain]
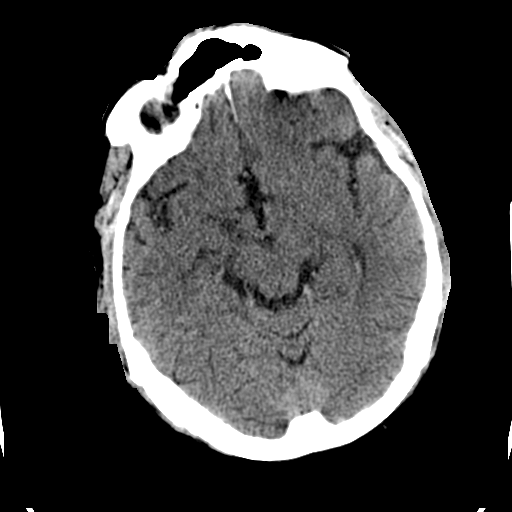
[im 15/36  brain]
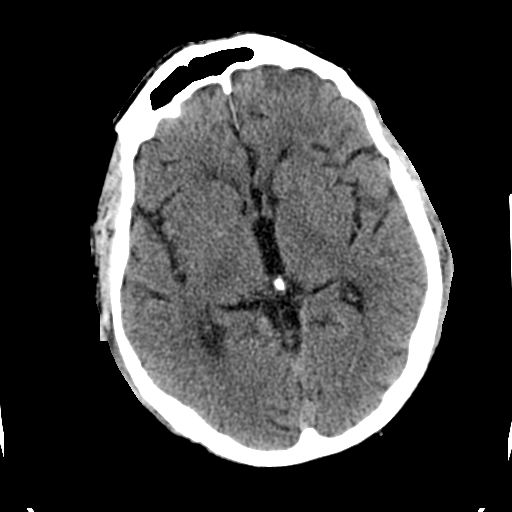
[im 17/36  brain]
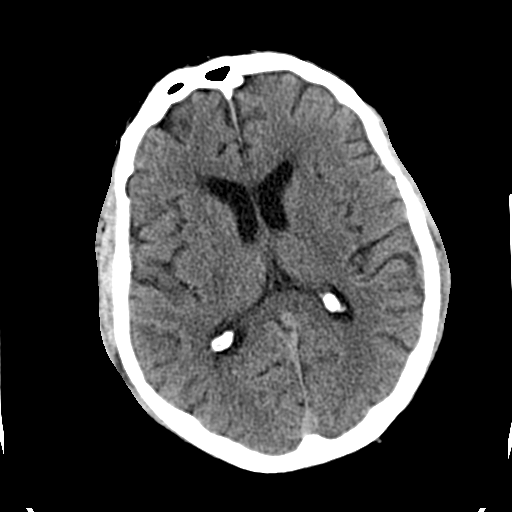
[im 19/36  brain]
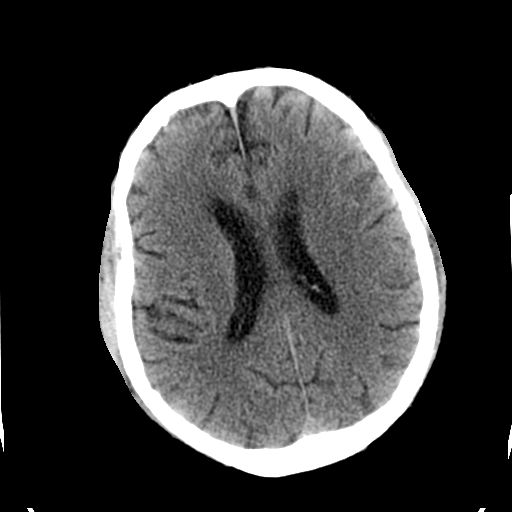
[im 19/36  bone]
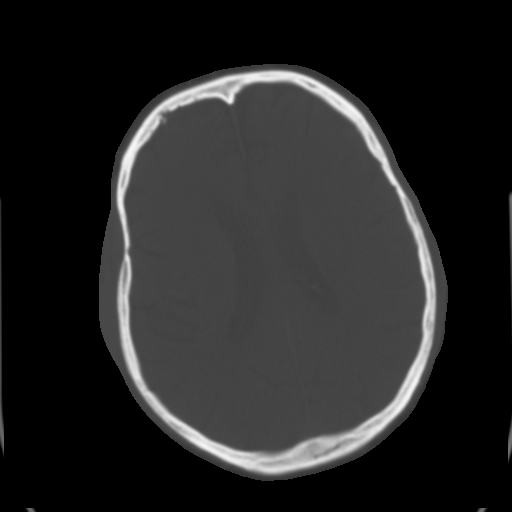
[im 21/36  brain]
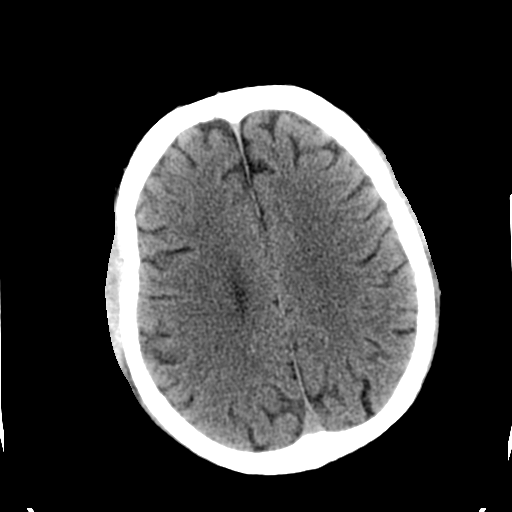
[im 23/36  brain]
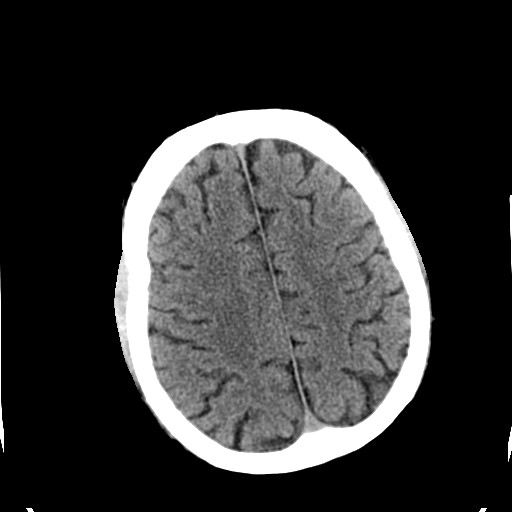
[im 26/36  brain]
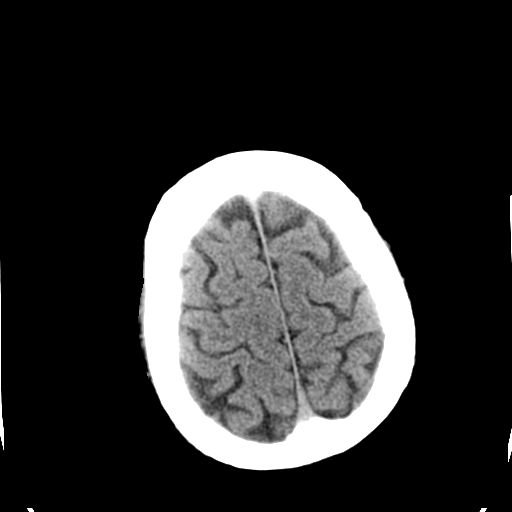
[im 27/36  brain]
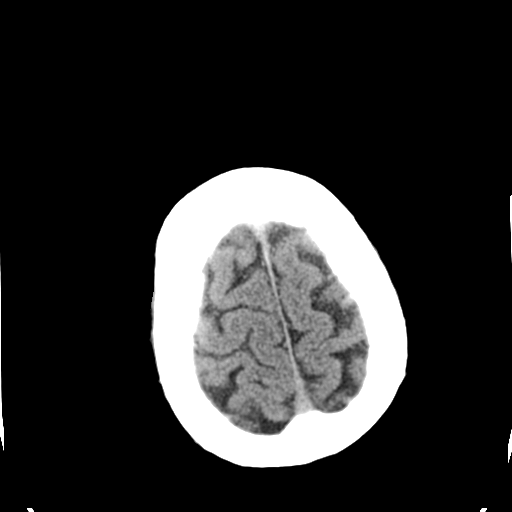
[im 27/36  bone]
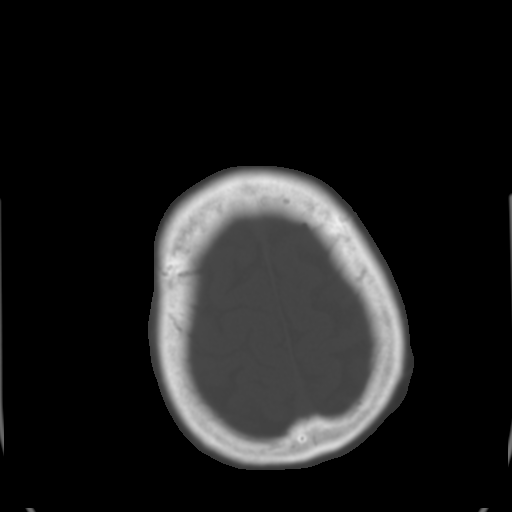
[im 29/36  brain]
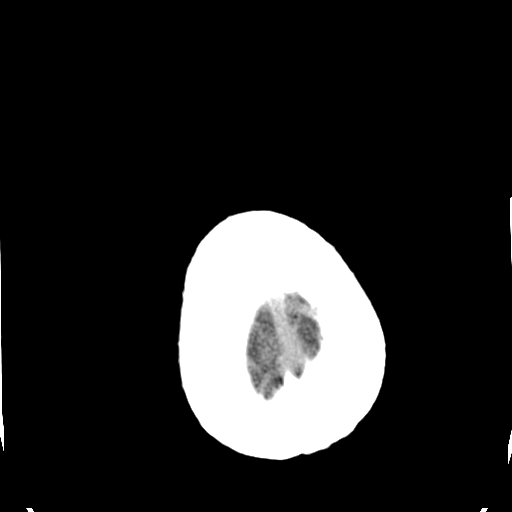
[im 32/36  brain]
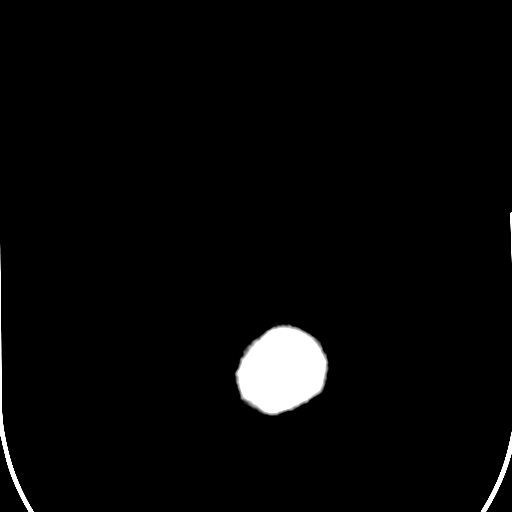
[im 34/36  brain]
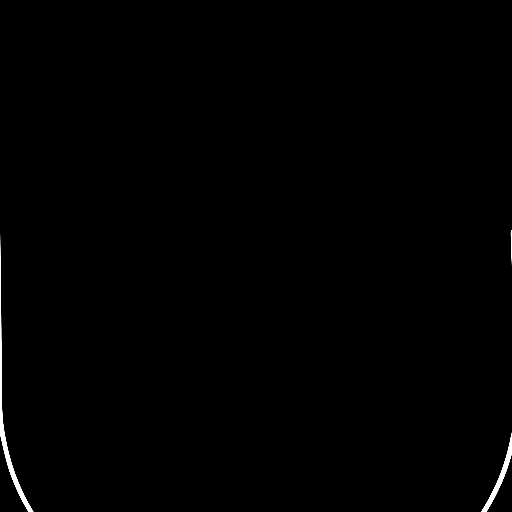

[16 of 30 positions shown; findings below may reference images not displayed]

FINDINGS: The ventricles and sulci are appropriate in size for the patient's
age. Minimal periventricular and deep white matter areas of
hypodensity is compatible with chronic skin changes. There is no
intracranial hemorrhage. No mass effect or midline shift
identified.There is no extra-axial fluid collection.

The visualized paranasal sinuses and mastoid air cells are well
aerated. The calvarium is intact.
IMPRESSION: No acute intracranial pathology.

## 2017-03-30 IMAGING — DX DG LUMBAR SPINE COMPLETE 4+V
5 series · 5 of 5 positions shown · non-contrast
Comparison: February 11, 2013.

CLINICAL DATA: Low back pain.

EXAM:
LUMBAR SPINE - COMPLETE 4+ VIEW

[l-spine ap]
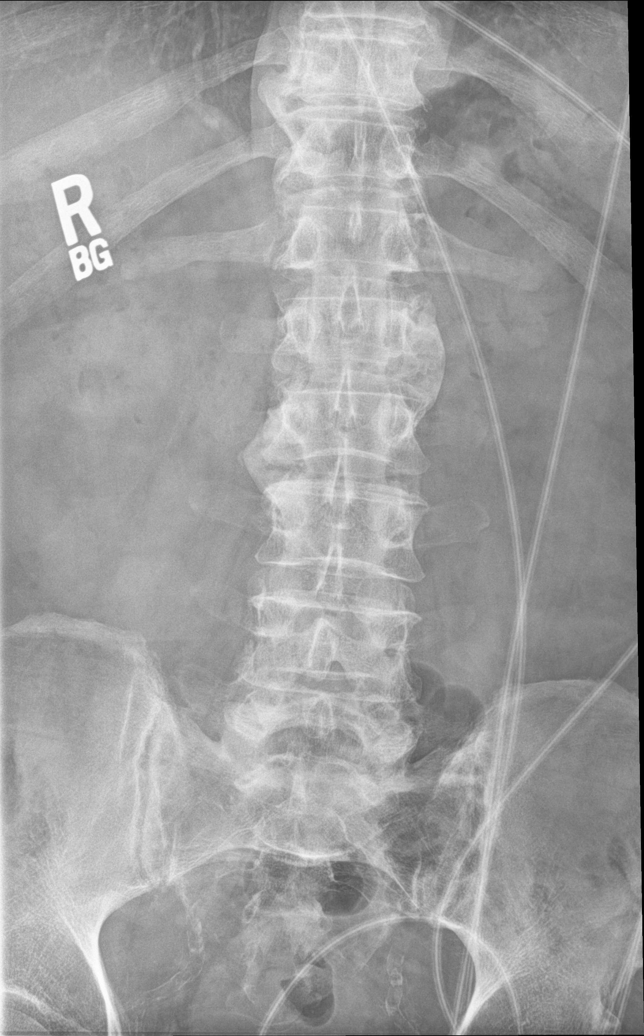

[l-spine obl (1 of 2)]
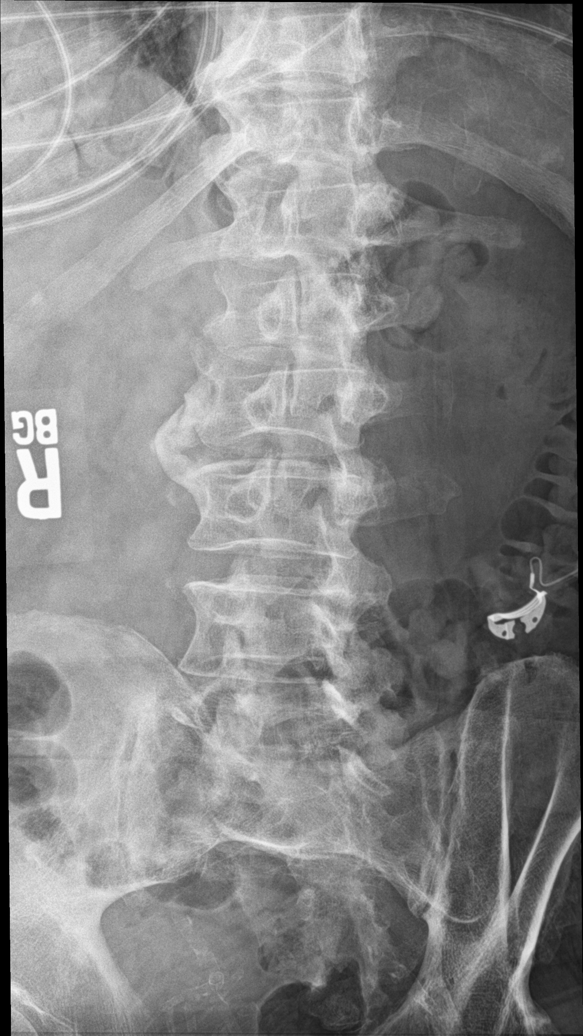

[l-spine obl (2 of 2)]
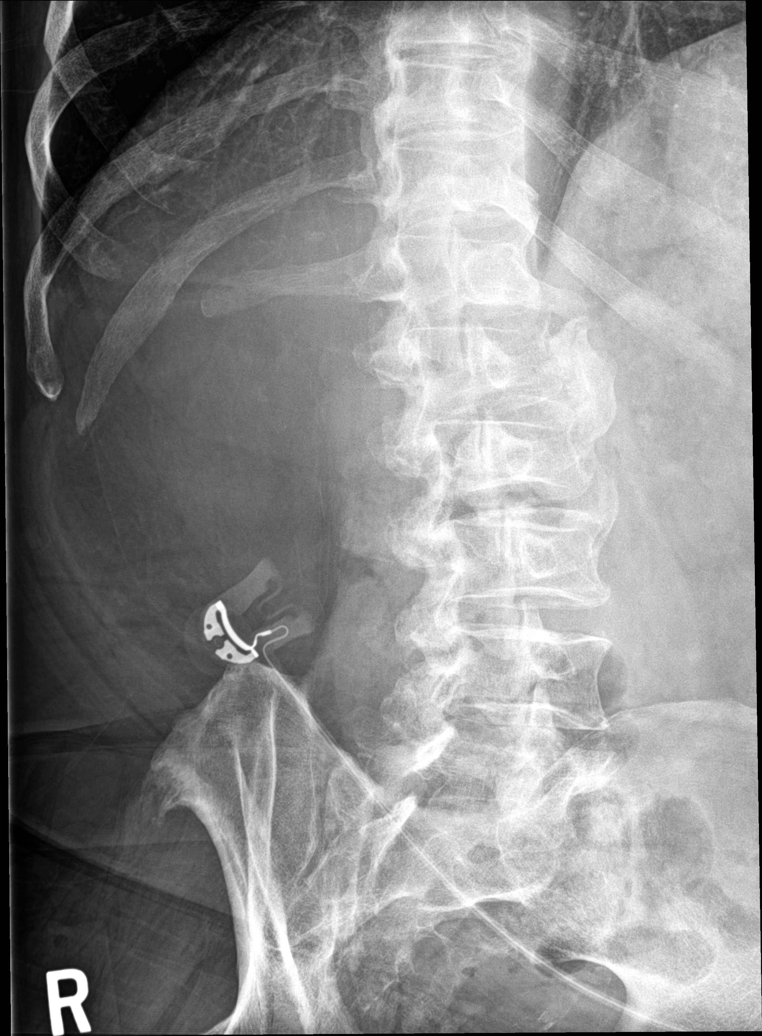

[l-spine lat]
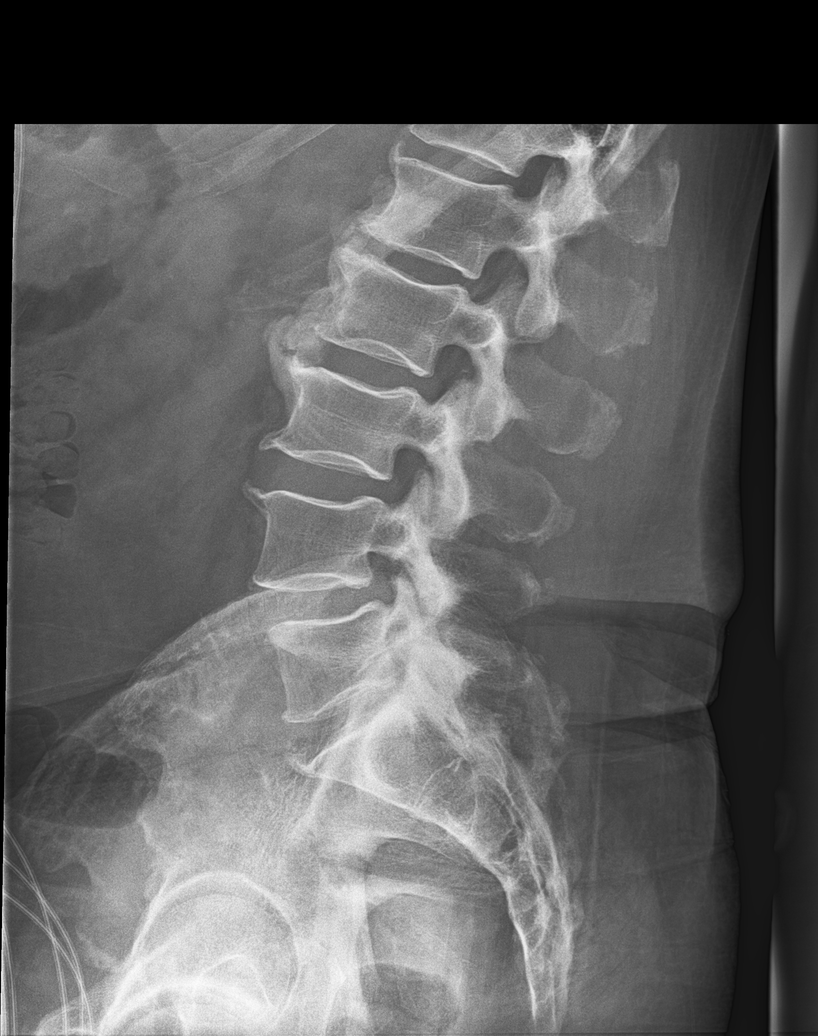

[l-spine spot]
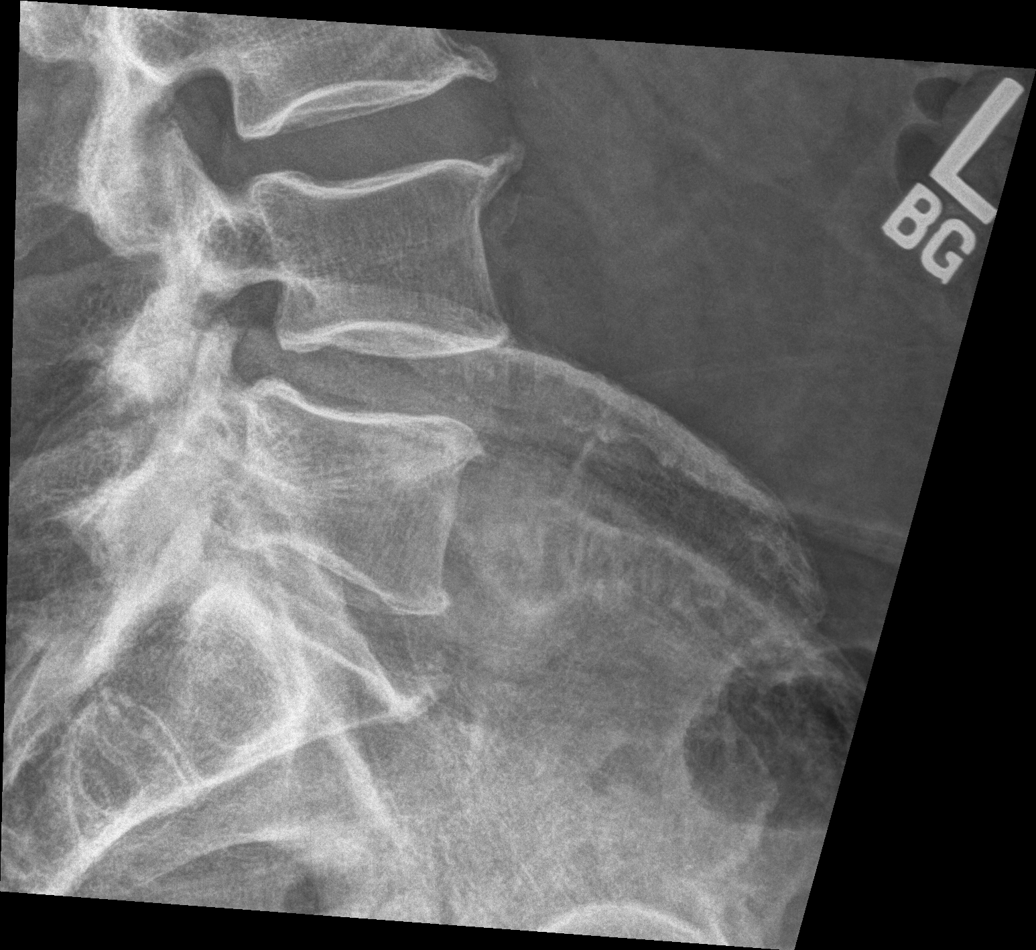

[5 of 5 positions shown; findings below may reference images not displayed]

FINDINGS: No fracture is noted. Minimal grade 1 anterolisthesis is noted
secondary to posterior facet joint hypertrophy. Anterior osteophyte
formation is noted at L1-2, L2-3 and L3-4. Mild disc space narrowing
is noted at L1-2 and L2-3.
IMPRESSION: Degenerative changes as described above. No acute abnormality seen
in the lumbar spine.
